# Patient Record
Sex: Female | Born: 1950 | Race: Black or African American | Hispanic: No | Smoking: Former smoker
Health system: Southern US, Community
[De-identification: ages and names within clinical notes are randomized; demographics above are authoritative.]

## PROBLEM LIST (undated history)

## (undated) DIAGNOSIS — N390 Urinary tract infection, site not specified: Secondary | ICD-10-CM

## (undated) DIAGNOSIS — E039 Hypothyroidism, unspecified: Secondary | ICD-10-CM

## (undated) DIAGNOSIS — K921 Melena: Secondary | ICD-10-CM

## (undated) DIAGNOSIS — N189 Chronic kidney disease, unspecified: Secondary | ICD-10-CM

## (undated) DIAGNOSIS — E119 Type 2 diabetes mellitus without complications: Secondary | ICD-10-CM

## (undated) DIAGNOSIS — I2510 Atherosclerotic heart disease of native coronary artery without angina pectoris: Secondary | ICD-10-CM

## (undated) DIAGNOSIS — I10 Essential (primary) hypertension: Secondary | ICD-10-CM

## (undated) DIAGNOSIS — E7800 Pure hypercholesterolemia, unspecified: Secondary | ICD-10-CM

## (undated) DIAGNOSIS — I6529 Occlusion and stenosis of unspecified carotid artery: Secondary | ICD-10-CM

## (undated) DIAGNOSIS — M1990 Unspecified osteoarthritis, unspecified site: Secondary | ICD-10-CM

## (undated) HISTORY — DX: Chronic kidney disease, unspecified: N18.9

## (undated) HISTORY — PX: AORTIC VALVE REPLACEMENT (AVR)/CORONARY ARTERY BYPASS GRAFTING (CABG): SHX5725

## (undated) HISTORY — DX: Hypothyroidism, unspecified: E03.9

## (undated) HISTORY — PX: ABDOMINAL HYSTERECTOMY: SHX81

## (undated) HISTORY — DX: Urinary tract infection, site not specified: N39.0

## (undated) HISTORY — DX: Occlusion and stenosis of unspecified carotid artery: I65.29

## (undated) HISTORY — DX: Melena: K92.1

## (undated) HISTORY — PX: COLONOSCOPY: SHX174

## (undated) HISTORY — DX: Pure hypercholesterolemia, unspecified: E78.00

## (undated) HISTORY — PX: EYE SURGERY: SHX253

## (undated) HISTORY — DX: Essential (primary) hypertension: I10

---

## 2010-01-12 DIAGNOSIS — I219 Acute myocardial infarction, unspecified: Secondary | ICD-10-CM

## 2010-01-12 HISTORY — DX: Acute myocardial infarction, unspecified: I21.9

## 2011-05-13 HISTORY — PX: CORONARY ARTERY BYPASS GRAFT: SHX141

## 2011-05-14 DIAGNOSIS — E039 Hypothyroidism, unspecified: Secondary | ICD-10-CM | POA: Insufficient documentation

## 2019-04-04 DIAGNOSIS — I2510 Atherosclerotic heart disease of native coronary artery without angina pectoris: Secondary | ICD-10-CM | POA: Insufficient documentation

## 2019-04-04 DIAGNOSIS — I10 Essential (primary) hypertension: Secondary | ICD-10-CM | POA: Insufficient documentation

## 2019-04-04 DIAGNOSIS — N183 Chronic kidney disease, stage 3 unspecified: Secondary | ICD-10-CM | POA: Insufficient documentation

## 2021-02-04 ENCOUNTER — Encounter: Payer: Self-pay | Admitting: Internal Medicine

## 2021-02-04 ENCOUNTER — Ambulatory Visit (INDEPENDENT_AMBULATORY_CARE_PROVIDER_SITE_OTHER): Payer: Medicare HMO | Admitting: Internal Medicine

## 2021-02-04 VITALS — BP 142/74 | HR 62 | Temp 98.2°F | Ht 60.63 in | Wt 166.9 lb

## 2021-02-04 DIAGNOSIS — N183 Chronic kidney disease, stage 3 unspecified: Secondary | ICD-10-CM | POA: Diagnosis not present

## 2021-02-04 DIAGNOSIS — E785 Hyperlipidemia, unspecified: Secondary | ICD-10-CM

## 2021-02-04 DIAGNOSIS — I10 Essential (primary) hypertension: Secondary | ICD-10-CM | POA: Diagnosis not present

## 2021-02-04 DIAGNOSIS — I2510 Atherosclerotic heart disease of native coronary artery without angina pectoris: Secondary | ICD-10-CM

## 2021-02-04 DIAGNOSIS — E039 Hypothyroidism, unspecified: Secondary | ICD-10-CM | POA: Diagnosis not present

## 2021-02-04 NOTE — Progress Notes (Signed)
New Patient Office Visit     This visit occurred during the SARS-CoV-2 public health emergency.  Safety protocols were in place, including screening questions prior to the visit, additional usage of staff PPE, and extensive cleaning of exam room while observing appropriate contact time as indicated for disinfecting solutions.    CC/Reason for Visit: Establish care, discuss chronic medical conditions Previous PCP: Lorelei Pont, MD Last Visit: Unknown  HPI: Tasha Garrett is a 71 y.o. female who is coming in today for the above mentioned reasons. Past Medical History is significant for: Hypertension, hyperlipidemia, stage III chronic kidney disease, hypothyroidism, coronary artery disease status post non-ST elevated MI in 2012.  I have no records available for review today.  She lives in [place].  She was a smoker but quit in 2012, she smoked for 20 years about half pack a day.  She drinks alcohol occasionally.  No known drug allergies, past surgical history significant for CABG x3 in 2012 and a hysterectomy.  Family history significant for mother who died at age 39 with ALS.  She is currently working at a school kitchen part-time, she has 3 children, lives with her daughter, has 10 grandchildren.  She is due for colonoscopy this year and is requesting a GI referral.  She had a mammogram in November.  She has not had the most recent COVID booster, is unclear about her pneumonia vaccination status but states she had her flu vaccine in October and has completed her shingles series.  She has no acute concerns or complaints today.   Past Medical/Surgical History: Past Medical History:  Diagnosis Date   Blood in stool    Chronic kidney disease    High cholesterol    Hypertension    Hypothyroid    Urinary tract infection     Past Surgical History:  Procedure Laterality Date   ABDOMINAL HYSTERECTOMY     AORTIC VALVE REPLACEMENT (AVR)/CORONARY ARTERY BYPASS GRAFTING (CABG)       Social History:  reports that she has quit smoking. Her smoking use included cigarettes. She has never used smokeless tobacco. She reports current alcohol use. She reports that she does not currently use drugs.  Allergies: No Known Allergies  Family History:  Family History  Problem Relation Age of Onset   ALS Mother      Current Outpatient Medications:    albuterol (VENTOLIN HFA) 108 (90 Base) MCG/ACT inhaler, Inhale 2 puffs into the lungs as needed., Disp: , Rfl:    amLODipine (NORVASC) 5 MG tablet, Take 1 tablet by mouth daily., Disp: , Rfl:    ASPIRIN 81 PO, Take 1 tablet by mouth daily., Disp: , Rfl:    atorvastatin (LIPITOR) 20 MG tablet, Take 20 mg by mouth daily., Disp: , Rfl:    Cod Liver Oil CAPS, Take by mouth., Disp: , Rfl:    FLUoxetine (PROZAC) 20 MG capsule, Take 20 mg by mouth daily., Disp: , Rfl:    hydrocortisone (ANUSOL-HC) 2.5 % rectal cream, SMARTSIG:Sparingly Topical 2-4 Times Daily PRN, Disp: , Rfl:    isosorbide mononitrate (IMDUR) 30 MG 24 hr tablet, Take 30 mg by mouth daily., Disp: , Rfl:    latanoprost (XALATAN) 0.005 % ophthalmic solution, 1 drop at bedtime., Disp: , Rfl:    levothyroxine (SYNTHROID) 137 MCG tablet, Take 137 mcg by mouth daily., Disp: , Rfl:    losartan (COZAAR) 100 MG tablet, Take 100 mg by mouth daily., Disp: , Rfl:    metoprolol tartrate (LOPRESSOR)  25 MG tablet, Take 25 mg by mouth 2 (two) times daily., Disp: , Rfl:    Multiple Vitamins-Minerals (MULTIVITAMIN ADULTS 50+ PO), Take 1 tablet by mouth daily., Disp: , Rfl:    terbinafine (LAMISIL) 250 MG tablet, Take 250 mg by mouth daily., Disp: , Rfl:    tiZANidine (ZANAFLEX) 4 MG tablet, Take 4 mg by mouth at bedtime., Disp: , Rfl:   Review of Systems:  Constitutional: Denies fever, chills, diaphoresis, appetite change and fatigue.  HEENT: Denies photophobia, eye pain, redness, hearing loss, ear pain, congestion, sore throat, rhinorrhea, sneezing, mouth sores, trouble swallowing,  neck pain, neck stiffness and tinnitus.   Respiratory: Denies SOB, DOE, cough, chest tightness,  and wheezing.   Cardiovascular: Denies chest pain, palpitations and leg swelling.  Gastrointestinal: Denies nausea, vomiting, abdominal pain, diarrhea, constipation, blood in stool and abdominal distention.  Genitourinary: Denies dysuria, urgency, frequency, hematuria, flank pain and difficulty urinating.  Endocrine: Denies: hot or cold intolerance, sweats, changes in hair or nails, polyuria, polydipsia. Musculoskeletal: Denies myalgias, back pain, joint swelling, arthralgias and gait problem.  Skin: Denies pallor, rash and wound.  Neurological: Denies dizziness, seizures, syncope, weakness, light-headedness, numbness and headaches.  Hematological: Denies adenopathy. Easy bruising, personal or family bleeding history  Psychiatric/Behavioral: Denies suicidal ideation, mood changes, confusion, nervousness, sleep disturbance and agitation    Physical Exam: Vitals:   02/04/21 1446  BP: (!) 142/74  Pulse: 62  Temp: 98.2 F (36.8 C)  TempSrc: Oral  SpO2: 96%  Weight: 166 lb 14.4 oz (75.7 kg)  Height: 5' 0.63" (1.54 m)   Body mass index is 31.92 kg/m.   Constitutional: NAD, calm, comfortable Eyes: PERRL, lids and conjunctivae normal ENMT: Mucous membranes are moist.  Respiratory: clear to auscultation bilaterally, no wheezing, no crackles. Normal respiratory effort. No accessory muscle use.  Cardiovascular: Regular rate and rhythm, no murmurs / rubs / gallops. No extremity edema.  Neurologic: Grossly intact and nonfocal Psychiatric: Normal judgment and insight. Alert and oriented x 3. Normal mood.    Impression and Plan:  Stage 3 chronic kidney disease, unspecified whether stage 3a or 3b CKD ([place]) -Check kidney function for baseline, she tells me she has a nephrologist in [place] as well.  Coronary arteriosclerosis -She has a cardiologist in [place].  Hyperlipidemia with target  LDL less than 70  - Plan: Lipid panel, Lipid panel  Essential hypertension  - Plan: CBC with Differential/Platelet, Comprehensive metabolic panel, Hemoglobin A1c, Hemoglobin A1c, Comprehensive metabolic panel, CBC with Differential/Platelet -Blood pressure is above goal today. -She will do ambulatory blood pressure monitoring and return in 3 months for follow-up.  Hypothyroidism, unspecified type  - Plan: TSH, TSH  Time spent: 46 minutes reviewing chart, interviewing and examining patient and formulating plan of care.    Patient Instructions  -Nice seeing you today!!  -Lab work today; will notify you once results are available.  -See you back in 6 months.    Lelon Frohlich, MD [facility]

## 2021-02-04 NOTE — Patient Instructions (Signed)
-  Nice seeing you today!!  -Lab work today; will notify you once results are available.  -See you back in 6 months! 

## 2021-02-05 LAB — CBC WITH DIFFERENTIAL/PLATELET
Basophils Absolute: 0.1 10*3/uL (ref 0.0–0.1)
Basophils Relative: 1.5 % (ref 0.0–3.0)
Eosinophils Absolute: 0.4 10*3/uL (ref 0.0–0.7)
Eosinophils Relative: 4.6 % (ref 0.0–5.0)
HCT: 39 % (ref 36.0–46.0)
Hemoglobin: 12 g/dL (ref 12.0–15.0)
Lymphocytes Relative: 25 % (ref 12.0–46.0)
Lymphs Abs: 2 10*3/uL (ref 0.7–4.0)
MCHC: 30.8 g/dL (ref 30.0–36.0)
MCV: 88.8 fl (ref 78.0–100.0)
Monocytes Absolute: 1 10*3/uL (ref 0.1–1.0)
Monocytes Relative: 12 % (ref 3.0–12.0)
Neutro Abs: 4.6 10*3/uL (ref 1.4–7.7)
Neutrophils Relative %: 56.9 % (ref 43.0–77.0)
Platelets: 306 10*3/uL (ref 150.0–400.0)
RBC: 4.4 Mil/uL (ref 3.87–5.11)
RDW: 13.4 % (ref 11.5–15.5)
WBC: 8.1 10*3/uL (ref 4.0–10.5)

## 2021-02-05 LAB — LIPID PANEL
Cholesterol: 157 mg/dL (ref 0–200)
HDL: 32.6 mg/dL — ABNORMAL LOW (ref >39.00)
NonHDL: 124.56
Total CHOL/HDL Ratio: 5
Triglycerides: 218 mg/dL — ABNORMAL HIGH (ref 0.0–149.0)
VLDL: 43.6 mg/dL — ABNORMAL HIGH (ref 0.0–40.0)

## 2021-02-05 LAB — COMPREHENSIVE METABOLIC PANEL WITH GFR
ALT: 19 U/L (ref 0–35)
AST: 20 U/L (ref 0–37)
Albumin: 4.3 g/dL (ref 3.5–5.2)
Alkaline Phosphatase: 112 U/L (ref 39–117)
BUN: 32 mg/dL — ABNORMAL HIGH (ref 6–23)
CO2: 28 meq/L (ref 19–32)
Calcium: 10.6 mg/dL — ABNORMAL HIGH (ref 8.4–10.5)
Chloride: 104 meq/L (ref 96–112)
Creatinine, Ser: 1.39 mg/dL — ABNORMAL HIGH (ref 0.40–1.20)
GFR: 38.38 mL/min — ABNORMAL LOW (ref >60.00)
Glucose, Bld: 117 mg/dL — ABNORMAL HIGH (ref 70–99)
Potassium: 4.7 meq/L (ref 3.5–5.1)
Sodium: 140 meq/L (ref 135–145)
Total Bilirubin: 0.5 mg/dL (ref 0.2–1.2)
Total Protein: 7.6 g/dL (ref 6.0–8.3)

## 2021-02-05 LAB — LDL CHOLESTEROL, DIRECT: Direct LDL: 100 mg/dL

## 2021-02-05 LAB — HEMOGLOBIN A1C: Hgb A1c MFr Bld: 6.4 % (ref 4.6–6.5)

## 2021-02-05 LAB — TSH: TSH: 1.62 u[IU]/mL (ref 0.35–5.50)

## 2021-02-06 ENCOUNTER — Encounter: Payer: Self-pay | Admitting: Internal Medicine

## 2021-02-06 ENCOUNTER — Other Ambulatory Visit: Payer: Self-pay | Admitting: Internal Medicine

## 2021-02-06 DIAGNOSIS — E785 Hyperlipidemia, unspecified: Secondary | ICD-10-CM

## 2021-02-06 DIAGNOSIS — R7302 Impaired glucose tolerance (oral): Secondary | ICD-10-CM | POA: Insufficient documentation

## 2021-02-06 MED ORDER — ATORVASTATIN CALCIUM 40 MG PO TABS: 40.0000 mg | ORAL_TABLET | Freq: Every day | ORAL | 1 refills | Status: DC

## 2021-02-10 ENCOUNTER — Telehealth: Payer: Self-pay | Admitting: Internal Medicine

## 2021-02-10 NOTE — Telephone Encounter (Signed)
Patient is returning a phone call back from Dr.Hernandez's CMA.  Patient could be contacted at [phone].  Please advise.

## 2021-02-10 NOTE — Telephone Encounter (Signed)
Called pt back unaware who left the message. Advise pt that it could have been for labs. Pt verbalized understanding.

## 2021-02-26 ENCOUNTER — Ambulatory Visit (INDEPENDENT_AMBULATORY_CARE_PROVIDER_SITE_OTHER): Payer: Medicare HMO

## 2021-02-26 ENCOUNTER — Ambulatory Visit (INDEPENDENT_AMBULATORY_CARE_PROVIDER_SITE_OTHER): Payer: Medicare HMO | Admitting: Internal Medicine

## 2021-02-26 ENCOUNTER — Other Ambulatory Visit: Payer: Self-pay

## 2021-02-26 VITALS — BP 150/75 | HR 72 | Temp 98.7°F | Resp 12 | Ht 60.06 in | Wt 166.9 lb

## 2021-02-26 DIAGNOSIS — K921 Melena: Secondary | ICD-10-CM | POA: Diagnosis not present

## 2021-02-26 DIAGNOSIS — R053 Chronic cough: Secondary | ICD-10-CM

## 2021-02-26 MED ORDER — HYDROCORTISONE (PERIANAL) 2.5 % EX CREA: 1.0000 "application " | TOPICAL_CREAM | Freq: Two times a day (BID) | CUTANEOUS | 0 refills | Status: DC

## 2021-02-26 NOTE — Patient Instructions (Addendum)
-  Nice seeing you today!!  -No need for lab work today.  -Anusol twice a day to anal area. Will get you in to see GI.  -CXR and CT chest for lung cancer screening have been ordered.

## 2021-02-26 NOTE — Progress Notes (Signed)
Established Patient Office Visit     This visit occurred during the SARS-CoV-2 public health emergency.  Safety protocols were in place, including screening questions prior to the visit, additional usage of staff PPE, and extensive cleaning of exam room while observing appropriate contact time as indicated for disinfecting solutions.    CC/Reason for Visit: Blood in stool and chronic cough  HPI: Tasha Garrett is a 71 y.o. female who is coming in today for the above mentioned reasons. Past Medical History is significant for: Hypertension, hyperlipidemia, impaired glucose tolerance, stage III chronic kidney disease, coronary artery disease and hypothyroidism.  She has been having bright red blood in her stool now for a few months.  It is small amounts that she notices mostly when she uses toilet paper.  Her anal area is very pruritic.  She has a history of hemorrhoids.  She has also been having a chronic cough that sounds like has been present for years.  She tells me she first remembered having it around 2012 when she had her cardiac event.  This cough is daily, it is dry, she is not on an ACE inhibitor.  She was a heavy smoker of about a pack to a pack and a half a day until her cardiac event in 2012.   Past Medical/Surgical History: Past Medical History:  Diagnosis Date   Blood in stool    Chronic kidney disease    High cholesterol    Hypertension    Hypothyroid    Urinary tract infection     Past Surgical History:  Procedure Laterality Date   ABDOMINAL HYSTERECTOMY     AORTIC VALVE REPLACEMENT (AVR)/CORONARY ARTERY BYPASS GRAFTING (CABG)      Social History:  reports that she has quit smoking. Her smoking use included cigarettes. She has never used smokeless tobacco. She reports current alcohol use. She reports that she does not currently use drugs.  Allergies: No Known Allergies  Family History:  Family History  Problem Relation Age of Onset   ALS Mother       Current Outpatient Medications:    albuterol (VENTOLIN HFA) 108 (90 Base) MCG/ACT inhaler, Inhale 2 puffs into the lungs as needed., Disp: , Rfl:    amLODipine (NORVASC) 5 MG tablet, Take 1 tablet by mouth daily., Disp: , Rfl:    ASPIRIN 81 PO, Take 1 tablet by mouth daily., Disp: , Rfl:    atorvastatin (LIPITOR) 40 MG tablet, Take 1 tablet (40 mg total) by mouth daily., Disp: 90 tablet, Rfl: 1   Cod Liver Oil CAPS, Take by mouth., Disp: , Rfl:    FLUoxetine (PROZAC) 20 MG capsule, Take 20 mg by mouth daily., Disp: , Rfl:    hydrocortisone (ANUSOL-HC) 2.5 % rectal cream, Place 1 application rectally 2 (two) times daily., Disp: 30 g, Rfl: 0   isosorbide mononitrate (IMDUR) 30 MG 24 hr tablet, Take 30 mg by mouth daily., Disp: , Rfl:    latanoprost (XALATAN) 0.005 % ophthalmic solution, 1 drop at bedtime., Disp: , Rfl:    levothyroxine (SYNTHROID) 137 MCG tablet, Take 137 mcg by mouth daily., Disp: , Rfl:    losartan (COZAAR) 100 MG tablet, Take 100 mg by mouth daily., Disp: , Rfl:    metoprolol tartrate (LOPRESSOR) 25 MG tablet, Take 25 mg by mouth 2 (two) times daily., Disp: , Rfl:    Multiple Vitamins-Minerals (MULTIVITAMIN ADULTS 50+ PO), Take 1 tablet by mouth daily., Disp: , Rfl:    terbinafine (LAMISIL)  250 MG tablet, Take 250 mg by mouth daily., Disp: , Rfl:   Review of Systems:  Constitutional: Denies fever, chills, diaphoresis, appetite change and fatigue.  HEENT: Denies photophobia, eye pain, redness, hearing loss, ear pain, congestion, sore throat, rhinorrhea, sneezing, mouth sores, trouble swallowing, neck pain, neck stiffness and tinnitus.   Respiratory: Denies SOB, DOE, chest tightness,  and wheezing.   Cardiovascular: Denies chest pain, palpitations and leg swelling.  Gastrointestinal: Denies nausea, vomiting, abdominal pain, diarrhea, constipation and abdominal distention.  Genitourinary: Denies dysuria, urgency, frequency, hematuria, flank pain and difficulty urinating.   Endocrine: Denies: hot or cold intolerance, sweats, changes in hair or nails, polyuria, polydipsia. Musculoskeletal: Denies myalgias, back pain, joint swelling, arthralgias and gait problem.  Skin: Denies pallor, rash and wound.  Neurological: Denies dizziness, seizures, syncope, weakness, light-headedness, numbness and headaches.  Hematological: Denies adenopathy. Easy bruising, personal or family bleeding history  Psychiatric/Behavioral: Denies suicidal ideation, mood changes, confusion, nervousness, sleep disturbance and agitation    Physical Exam: Vitals:   02/26/21 1355  BP: (!) 150/75  Pulse: 72  Resp: 12  Temp: 98.7 F (37.1 C)  Weight: 166 lb 14.4 oz (75.7 kg)  Height: 5' 0.06" (1.526 m)    Body mass index is 32.53 kg/m.   Constitutional: NAD, calm, comfortable Eyes: PERRL, lids and conjunctivae normal, wears corrective lenses ENMT: Mucous membranes are moist.  Respiratory: clear to auscultation bilaterally, no wheezing, no crackles. Normal respiratory effort. No accessory muscle use.  Cardiovascular: Regular rate and rhythm, no murmurs / rubs / gallops. No extremity edema.  Neurologic: Grossly intact and nonfocal Psychiatric: Normal judgment and insight. Alert and oriented x 3. Normal mood.    Impression and Plan:  Hematochezia  - Plan: hydrocortisone (ANUSOL-HC) 2.5 % rectal cream, Ambulatory referral to Gastroenterology -Suspect related to her history of hemorrhoids, possibly an anal fissure.  She will use hydrocortisone cream, she tells me she is due for screening colonoscopy this year anyways so we will refer to GI for evaluation.  Chronic cough  - Plan: DG Chest 2 View, Ambulatory Referral Lung Cancer Screening Home Pulmonary  Time spent: 31 minutes reviewing chart, interviewing and examining patient and formulating plan of care.   Patient Instructions  -Nice seeing you today!!  -No need for labwork today.  -Anusol twice a day to anal area. Will  get you in to see GI.  -CXR and CT chest for lung cancer screening have been ordered.    Lelon Frohlich, MD [facility]

## 2021-02-26 NOTE — Addendum Note (Signed)
Addended by: Henderson Cloud on: 02/26/2021 02:31 PM   Modules accepted: Orders

## 2021-02-27 NOTE — Progress Notes (Signed)
CXR is normal. Await CT chest. If that is normal, can consider empirically treating for GERD to see if that would improve her chronic cough.

## 2021-03-11 ENCOUNTER — Other Ambulatory Visit: Payer: Self-pay | Admitting: *Deleted

## 2021-03-11 DIAGNOSIS — E785 Hyperlipidemia, unspecified: Secondary | ICD-10-CM

## 2021-03-11 MED ORDER — ATORVASTATIN CALCIUM 40 MG PO TABS: 40.0000 mg | ORAL_TABLET | Freq: Every day | ORAL | 1 refills | Status: DC

## 2021-04-03 ENCOUNTER — Other Ambulatory Visit: Payer: Self-pay | Admitting: Internal Medicine

## 2021-04-03 DIAGNOSIS — I10 Essential (primary) hypertension: Secondary | ICD-10-CM

## 2021-04-17 ENCOUNTER — Other Ambulatory Visit: Payer: Self-pay | Admitting: *Deleted

## 2021-04-17 DIAGNOSIS — Z87891 Personal history of nicotine dependence: Secondary | ICD-10-CM

## 2021-04-17 DIAGNOSIS — Z122 Encounter for screening for malignant neoplasm of respiratory organs: Secondary | ICD-10-CM

## 2021-05-08 ENCOUNTER — Encounter: Payer: Self-pay | Admitting: Internal Medicine

## 2021-05-08 ENCOUNTER — Ambulatory Visit (INDEPENDENT_AMBULATORY_CARE_PROVIDER_SITE_OTHER): Payer: Medicare HMO | Admitting: Internal Medicine

## 2021-05-08 VITALS — BP 130/70 | HR 64 | Temp 97.7°F | Wt 164.9 lb

## 2021-05-08 DIAGNOSIS — I10 Essential (primary) hypertension: Secondary | ICD-10-CM

## 2021-05-08 DIAGNOSIS — Z13820 Encounter for screening for osteoporosis: Secondary | ICD-10-CM

## 2021-05-08 DIAGNOSIS — R7302 Impaired glucose tolerance (oral): Secondary | ICD-10-CM

## 2021-05-08 DIAGNOSIS — E1159 Type 2 diabetes mellitus with other circulatory complications: Secondary | ICD-10-CM

## 2021-05-08 DIAGNOSIS — E119 Type 2 diabetes mellitus without complications: Secondary | ICD-10-CM | POA: Insufficient documentation

## 2021-05-08 DIAGNOSIS — E785 Hyperlipidemia, unspecified: Secondary | ICD-10-CM

## 2021-05-08 DIAGNOSIS — Z1211 Encounter for screening for malignant neoplasm of colon: Secondary | ICD-10-CM

## 2021-05-08 DIAGNOSIS — N183 Chronic kidney disease, stage 3 unspecified: Secondary | ICD-10-CM

## 2021-05-08 LAB — POCT GLYCOSYLATED HEMOGLOBIN (HGB A1C): Hemoglobin A1C: 6.6 % — AB (ref 4.0–5.6)

## 2021-05-08 NOTE — Progress Notes (Signed)
? ? ? ?Established Patient Office Visit ? ? ? ? ?This visit occurred during the SARS-CoV-2 public health emergency.  Safety protocols were in place, including screening questions prior to the visit, additional usage of staff PPE, and extensive cleaning of exam room while observing appropriate contact time as indicated for disinfecting solutions.  ? ? ?CC/Reason for Visit: 21-month follow-up chronic medical conditions ? ?HPI: Tasha Garrett is a 71 y.o. female who is coming in today for the above mentioned reasons. Past Medical History is significant for: Hypertension, hyperlipidemia, stage III chronic kidney disease, hypothyroidism, coronary artery disease status post non-ST elevated MI in 2012.  She feels well today and has no acute concerns.  She was noted to have an elevated blood pressure at her last in office visit.  She was asked to do ambulatory monitoring.  She does not bring in her log today but tells me that it averaged 1 [id number] systolic over [id number] diastolic.  She feels well and has no acute concerns.  She has a chronic cough, chest x-ray in office was negative.  Because of her smoking history she was referred for a low-dose CT scan for lung cancer screening that is scheduled for May 19.  She was diagnosed as a prediabetic with an A1c of 6.4 in January.  She is here to follow-up on this as well. ? ? ?Past Medical/Surgical History: ?Past Medical History:  ?Diagnosis Date  ? Blood in stool   ? Chronic kidney disease   ? High cholesterol   ? Hypertension   ? Hypothyroid   ? Urinary tract infection   ? ? ?Past Surgical History:  ?Procedure Laterality Date  ? ABDOMINAL HYSTERECTOMY    ? AORTIC VALVE REPLACEMENT (AVR)/CORONARY ARTERY BYPASS GRAFTING (CABG)    ? ? ?Social History: ? reports that she has quit smoking. Her smoking use included cigarettes. She has never used smokeless tobacco. She reports current alcohol use. She reports that she does not currently use drugs. ? ?Allergies: ?No Known Allergies ? ?Family  History:  ?Family History  ?Problem Relation Age of Onset  ? ALS Mother   ? ? ? ?Current Outpatient Medications:  ?  albuterol (VENTOLIN HFA) 108 (90 Base) MCG/ACT inhaler, Inhale 2 puffs into the lungs as needed., Disp: , Rfl:  ?  amLODipine (NORVASC) 5 MG tablet, TAKE 1 TABLET BY MOUTH EVERY DAY, Disp: 90 tablet, Rfl: 1 ?  ASPIRIN 81 PO, Take 1 tablet by mouth daily., Disp: , Rfl:  ?  atorvastatin (LIPITOR) 40 MG tablet, Take 1 tablet (40 mg total) by mouth daily., Disp: 90 tablet, Rfl: 1 ?  Cod Liver Oil CAPS, Take by mouth., Disp: , Rfl:  ?  FLUoxetine (PROZAC) 20 MG capsule, Take 20 mg by mouth daily., Disp: , Rfl:  ?  hydrocortisone (ANUSOL-HC) 2.5 % rectal cream, Place 1 application rectally 2 (two) times daily., Disp: 30 g, Rfl: 0 ?  isosorbide mononitrate (IMDUR) 30 MG 24 hr tablet, Take 30 mg by mouth daily., Disp: , Rfl:  ?  latanoprost (XALATAN) 0.005 % ophthalmic solution, 1 drop at bedtime., Disp: , Rfl:  ?  levothyroxine (SYNTHROID) 137 MCG tablet, Take 137 mcg by mouth daily., Disp: , Rfl:  ?  losartan (COZAAR) 100 MG tablet, Take 100 mg by mouth daily., Disp: , Rfl:  ?  metoprolol tartrate (LOPRESSOR) 25 MG tablet, Take 25 mg by mouth 2 (two) times daily., Disp: , Rfl:  ?  Multiple Vitamins-Minerals (MULTIVITAMIN ADULTS 50+ PO), Take 1 tablet  by mouth daily., Disp: , Rfl:  ?  terbinafine (LAMISIL) 250 MG tablet, Take 250 mg by mouth daily., Disp: , Rfl:  ? ?Review of Systems:  ?Constitutional: Denies fever, chills, diaphoresis, appetite change and fatigue.  ?HEENT: Denies photophobia, eye pain, redness, hearing loss, ear pain, congestion, sore throat, rhinorrhea, sneezing, mouth sores, trouble swallowing, neck pain, neck stiffness and tinnitus.   ?Respiratory: Denies SOB, DOE, cough, chest tightness,  and wheezing.   ?Cardiovascular: Denies chest pain, palpitations and leg swelling.  ?Gastrointestinal: Denies nausea, vomiting, abdominal pain, diarrhea, constipation, blood in stool and abdominal  distention.  ?Genitourinary: Denies dysuria, urgency, frequency, hematuria, flank pain and difficulty urinating.  ?Endocrine: Denies: hot or cold intolerance, sweats, changes in hair or nails, polyuria, polydipsia. ?Musculoskeletal: Denies myalgias, back pain, joint swelling, arthralgias and gait problem.  ?Skin: Denies pallor, rash and wound.  ?Neurological: Denies dizziness, seizures, syncope, weakness, light-headedness, numbness and headaches.  ?Hematological: Denies adenopathy. Easy bruising, personal or family bleeding history  ?Psychiatric/Behavioral: Denies suicidal ideation, mood changes, confusion, nervousness, sleep disturbance and agitation ? ? ? ?Physical Exam: ?Vitals:  ? 05/08/21 1530 05/08/21 1600 05/08/21 1631  ?BP: (!) 150/80 (!) 150/80 130/70  ?Pulse: 64    ?Temp: 97.7 ?F (36.5 ?C)    ?TempSrc: Oral    ?SpO2: 98%    ?Weight: 164 lb 14.4 oz (74.8 kg)    ? ? ?Body mass index is 32.14 kg/m?. ? ? ?Constitutional: NAD, calm, comfortable ?Eyes: PERRL, lids and conjunctivae normal ?ENMT: Mucous membranes are moist. or bulging. ?Neck: normal, supple, no masses, no thyromegaly ?Respiratory: clear to auscultation bilaterally, no wheezing, no crackles. Normal respiratory effort. No accessory muscle use.  ?Cardiovascular: Regular rate and rhythm, no murmurs / rubs / gallops. No extremity edema.  ?Neurologic: Mostly intact and nonfocal ?Psychiatric: Normal judgment and insight. Alert and oriented x 3. Normal mood.  ? ? ?Impression and Plan: ? ?Type 2 diabetes mellitus with other circulatory complication, without long-term current use of insulin ([place])  ?- Plan: CBC with Differential/Platelet, Comprehensive metabolic panel, Microalbumin / creatinine urine ratio, Ambulatory referral to diabetic education, Ambulatory referral to Ophthalmology, Microalbumin / creatinine urine ratio, Comprehensive metabolic panel, CBC with Differential/Platelet ?-New diabetic based on A1c of 6.6 in office today. ?-She will be  referred to diabetic education, have given her information on nutrition, she will continue to work on lifestyle changes and return in 3 months for follow-up.  If A1c continues to increase may need to consider medication. ?-Microalbumin and diabetic eye exam requested today. ? ?Stage 3 chronic kidney disease, unspecified whether stage 3a or 3b CKD ([place]) ?-Last creatinine was 1.390 in January. ? ?Hyperlipidemia with target LDL less than 70  ?- Plan: Lipid panel, Lipid panel ?-On atorvastatin 40 mg daily. ? ?Essential hypertension ?-I wonder if she has an element of whitecoat syndrome as her home measurements are within range but her in office measurements are high. ?-She will return in 3 months for follow-up with her ambulatory log.  If measurements continue to be high may need to consider augmenting antihypertensive therapy. ? ?Colon cancer screening ? - Plan: Ambulatory referral to Gastroenterology ? ?Osteoporosis screening  ?- Plan: DG Bone Density ? ? ? ?Time spent:32 minutes reviewing chart, interviewing and examining patient and formulating plan of care. ? ? ?Patient Instructions  ?-Nice seeing you today!! ? ?-Lab work today; will notify you once results are available. ? ?-you are now a diabetic. Work on Lifestyle modifications: healthy eating, weight loss, increased physical activity.  ? ?  Remember to check your BP at home and bring numbers next time you come. ? ?-Schedule follow up in 3 months. ? ? ? ? ? ? ?Lelon Frohlich, MD ?[facility] ? ? ?

## 2021-05-08 NOTE — Patient Instructions (Signed)
-  Nice seeing you today!! ? ?-Lab work today; will notify you once results are available. ? ?-you are now a diabetic. Work on Lifestyle modifications: healthy eating, weight loss, increased physical activity.  ? ?Remember to check your BP at home and bring numbers next time you come. ? ?-Schedule follow up in 3 months. ? ? ? ?

## 2021-05-09 LAB — LIPID PANEL
Cholesterol: 125 mg/dL (ref 0–200)
HDL: 28 mg/dL — ABNORMAL LOW (ref >39.00)
LDL Cholesterol: 70 mg/dL (ref 0–99)
NonHDL: 97.04
Total CHOL/HDL Ratio: 4
Triglycerides: 135 mg/dL (ref 0.0–149.0)
VLDL: 27 mg/dL (ref 0.0–40.0)

## 2021-05-09 LAB — COMPREHENSIVE METABOLIC PANEL WITH GFR
ALT: 24 U/L (ref 0–35)
AST: 24 U/L (ref 0–37)
Albumin: 4.2 g/dL (ref 3.5–5.2)
Alkaline Phosphatase: 121 U/L — ABNORMAL HIGH (ref 39–117)
BUN: 23 mg/dL (ref 6–23)
CO2: 25 meq/L (ref 19–32)
Calcium: 9.7 mg/dL (ref 8.4–10.5)
Chloride: 106 meq/L (ref 96–112)
Creatinine, Ser: 1.13 mg/dL (ref 0.40–1.20)
GFR: 49.11 mL/min — ABNORMAL LOW (ref >60.00)
Glucose, Bld: 90 mg/dL (ref 70–99)
Potassium: 4.7 meq/L (ref 3.5–5.1)
Sodium: 140 meq/L (ref 135–145)
Total Bilirubin: 0.6 mg/dL (ref 0.2–1.2)
Total Protein: 7.2 g/dL (ref 6.0–8.3)

## 2021-05-09 LAB — CBC WITH DIFFERENTIAL/PLATELET
Basophils Absolute: 0.1 10*3/uL (ref 0.0–0.1)
Basophils Relative: 1.1 % (ref 0.0–3.0)
Eosinophils Absolute: 0.3 10*3/uL (ref 0.0–0.7)
Eosinophils Relative: 4.7 % (ref 0.0–5.0)
HCT: 38.2 % (ref 36.0–46.0)
Hemoglobin: 12.1 g/dL (ref 12.0–15.0)
Lymphocytes Relative: 25.1 % (ref 12.0–46.0)
Lymphs Abs: 1.6 10*3/uL (ref 0.7–4.0)
MCHC: 31.7 g/dL (ref 30.0–36.0)
MCV: 90.8 fl (ref 78.0–100.0)
Monocytes Absolute: 0.8 10*3/uL (ref 0.1–1.0)
Monocytes Relative: 12.8 % — ABNORMAL HIGH (ref 3.0–12.0)
Neutro Abs: 3.5 10*3/uL (ref 1.4–7.7)
Neutrophils Relative %: 56.3 % (ref 43.0–77.0)
Platelets: 287 10*3/uL (ref 150.0–400.0)
RBC: 4.2 Mil/uL (ref 3.87–5.11)
RDW: 13.4 % (ref 11.5–15.5)
WBC: 6.3 10*3/uL (ref 4.0–10.5)

## 2021-05-09 LAB — MICROALBUMIN / CREATININE URINE RATIO
Creatinine,U: 130.2 mg/dL
Microalb Creat Ratio: 34.4 mg/g — ABNORMAL HIGH (ref 0.0–30.0)
Microalb, Ur: 44.8 mg/dL — ABNORMAL HIGH (ref 0.0–1.9)

## 2021-05-12 ENCOUNTER — Encounter: Payer: Self-pay | Admitting: Internal Medicine

## 2021-05-12 DIAGNOSIS — R809 Proteinuria, unspecified: Secondary | ICD-10-CM | POA: Insufficient documentation

## 2021-05-27 ENCOUNTER — Encounter: Payer: Self-pay | Admitting: Acute Care

## 2021-05-27 ENCOUNTER — Ambulatory Visit (INDEPENDENT_AMBULATORY_CARE_PROVIDER_SITE_OTHER): Payer: Medicare HMO | Admitting: Acute Care

## 2021-05-27 DIAGNOSIS — Z87891 Personal history of nicotine dependence: Secondary | ICD-10-CM

## 2021-05-27 LAB — BASIC METABOLIC PANEL
Chloride: 107 (ref 99–108)
Creatinine: 1.3 — AB (ref 0.5–1.1)
Glucose: 118
Potassium: 5.2 mEq/L — AB (ref 3.5–5.1)
Sodium: 143 (ref 137–147)

## 2021-05-27 LAB — COMPREHENSIVE METABOLIC PANEL
Albumin: 4.3 (ref 3.5–5.0)
Calcium: 9.6 (ref 8.7–10.7)
eGFR: 49.61

## 2021-05-27 LAB — VITAMIN D 25 HYDROXY (VIT D DEFICIENCY, FRACTURES): Vit D, 25-Hydroxy: 33

## 2021-05-27 LAB — CBC AND DIFFERENTIAL: Hemoglobin: 11.7 — AB (ref 12.0–16.0)

## 2021-05-27 NOTE — Progress Notes (Deleted)
Virtual Visit via Telephone Note ? ?I connected with Tasha Garrett on 05/27/21 at  2:30 PM EDT by telephone and verified that I am speaking with the correct person using two identifiers. ? ?Location: ?Patient:  At home ?Provider:  [address], [place], [place], [address]  ?  ?I discussed the limitations, risks, security and privacy concerns of performing an evaluation and management service by telephone and the availability of in person appointments. I also discussed with the patient that there may be a patient responsible charge related to this service. The patient expressed understanding and agreed to proceed. ? ? ?Shared Decision Making Visit Lung Cancer Screening Program ?([phone]) ? ? ?Eligibility: ?Age 71 y.o. ?Pack Years Smoking History Calculation 20 pack year smoking history ?(# packs/per year x # years smoked) ?Recent History of coughing up blood  no ?Unexplained weight loss? no ?( >Than 15 pounds within the last 6 months ) ?Prior History Lung / other cancer no ?(Diagnosis within the last 5 years already requiring surveillance chest CT Scans). ?Smoking Status Former Smoker ?Former Smokers: Years since quit: 11 years ? Quit Date: 2012 ? ?Visit Components: ?Discussion included one or more decision making aids. yes ?Discussion included risk/benefits of screening. yes ?Discussion included potential follow up diagnostic testing for abnormal scans. yes ?Discussion included meaning and risk of over diagnosis. yes ?Discussion included meaning and risk of False Positives. yes ?Discussion included meaning of total radiation exposure. yes ? ?Counseling Included: ?Importance of adherence to annual lung cancer LDCT screening. yes ?Impact of comorbidities on ability to participate in the program. yes ?Ability and willingness to under diagnostic treatment. yes ? ?Smoking Cessation Counseling: ?Current Smokers:  ?Discussed importance of smoking cessation. yes ?Information about tobacco cessation classes and  interventions provided to patient. yes ?Patient provided with "ticket" for LDCT Scan. yes ?Symptomatic Patient. no ? Counseling NA ?Diagnosis Code: Tobacco Use Z72.0 ?Asymptomatic Patient yes ? Counseling (Intermediate counseling: > three minutes counseling) S0109 ?Former Smokers:  ?Discussed the importance of maintaining cigarette abstinence. yes ?Diagnosis Code: Personal History of Nicotine Dependence. N23.557 ?Information about tobacco cessation classes and interventions provided to patient. Yes ?Patient provided with "ticket" for LDCT Scan. yes ?Written Order for Lung Cancer Screening with LDCT placed in Epic. Yes ?(CT Chest Lung Cancer Screening Low Dose W/O CM) [id number] ?Z12.2-Screening of respiratory organs ?Z87.891-Personal history of nicotine dependence ? ?I spent 25 minutes of face to face time/virtual visit time  with  Tasha Garrett discussing the risks and benefits of lung cancer screening. We took the time to pause the power point at intervals to allow for questions to be asked and answered to ensure understanding. We discussed that she had taken the single most powerful action possible to decrease her risk of developing lung cancer when she quit smoking. I counseled her to remain smoke free, and to contact me if she ever had the desire to smoke again so that I can provide resources and tools to help support the effort to remain smoke free. We discussed the time and location of the scan, and that either  Abigail Miyamoto RN, Karlton Lemon, RN or I  or I will call / send a letter with the results within  24-72 hours of receiving them. She has the office contact information in the event she needs to speak with me,  she verbalized understanding of all of the above and had no further questions upon leaving the office.  ? ? ? ?I explained to the patient that there has  been a high incidence of coronary artery disease noted on these exams. I explained that this is a non-gated exam therefore degree or severity cannot  be determined. This patient is on statin therapy. I have asked the patient to follow-up with their PCP regarding any incidental finding of coronary artery disease and management with diet or medication as they feel is clinically indicated. The patient verbalized understanding of the above and had no further questions. ?  ? ? ?Magdalen Spatz, NP ?05/27/2021 ? ? ? ? ? ? ?

## 2021-05-27 NOTE — Patient Instructions (Signed)
Thank you for participating in the [facility] Screening Program. It was our pleasure to meet you today. We will call you with the results of your scan within the next few days. Your scan will be assigned a Lung RADS category score by the physicians reading the scans.  This Lung RADS score determines follow up scanning.  See below for description of categories, and follow up screening recommendations. We will be in touch to schedule your follow up screening annually or based on recommendations of our providers. We will fax a copy of your scan results to your Primary Care Physician, or the physician who referred you to the program, to ensure they have the results. Please call the office if you have any questions or concerns regarding your scanning experience or results.  Our office number is [phone]. Please speak with Denise Phelps, RN. , or  Denise Buckner RN, They are  our Lung Cancer Screening RN.'s If They are unavailable when you call, Please leave a message on the voice mail. We will return your call at our earliest convenience.This voice mail is monitored several times a day.  Remember, if your scan is normal, we will scan you annually as long as you continue to meet the criteria for the program. (Age 55-77, Current smoker or smoker who has quit within the last 15 years). If you are a smoker, remember, quitting is the single most powerful action that you can take to decrease your risk of lung cancer and other pulmonary, breathing related problems. We know quitting is hard, and we are here to help.  Please let us know if there is anything we can do to help you meet your goal of quitting. If you are a former smoker, congratulations. We are proud of you! Remain smoke free! Remember you can refer friends or family members through the number above.  We will screen them to make sure they meet criteria for the program. Thank you for helping us take better care of you by  participating in Lung Screening.  You can receive free nicotine replacement therapy ( patches, gum or mints) by calling 1-800-QUIT NOW. Please call so we can get you on the path to becoming  a non-smoker. I know it is hard, but you can do this!  Lung RADS Categories:  Lung RADS 1: no nodules or definitely non-concerning nodules.  Recommendation is for a repeat annual scan in 12 months.  Lung RADS 2:  nodules that are non-concerning in appearance and behavior with a very low likelihood of becoming an active cancer. Recommendation is for a repeat annual scan in 12 months.  Lung RADS 3: nodules that are probably non-concerning , includes nodules with a low likelihood of becoming an active cancer.  Recommendation is for a 6-month repeat screening scan. Often noted after an upper respiratory illness. We will be in touch to make sure you have no questions, and to schedule your 6-month scan.  Lung RADS 4 A: nodules with concerning findings, recommendation is most often for a follow up scan in 3 months or additional testing based on our provider's assessment of the scan. We will be in touch to make sure you have no questions and to schedule the recommended 3 month follow up scan.  Lung RADS 4 B:  indicates findings that are concerning. We will be in touch with you to schedule additional diagnostic testing based on our provider's  assessment of the scan.  Other options for assistance in smoking cessation (   As covered by your insurance benefits)  Hypnosis for smoking cessation  [employer] [phone]  Acupuncture for smoking cessation  [facility] [phone]   

## 2021-05-28 ENCOUNTER — Encounter: Payer: Medicare HMO | Admitting: Acute Care

## 2021-05-30 ENCOUNTER — Ambulatory Visit (HOSPITAL_COMMUNITY): Payer: Medicare HMO

## 2021-06-04 ENCOUNTER — Encounter: Payer: Self-pay | Admitting: Internal Medicine

## 2021-06-30 ENCOUNTER — Ambulatory Visit (INDEPENDENT_AMBULATORY_CARE_PROVIDER_SITE_OTHER): Payer: Medicare HMO | Admitting: Acute Care

## 2021-06-30 ENCOUNTER — Encounter: Payer: Self-pay | Admitting: Acute Care

## 2021-06-30 DIAGNOSIS — Z87891 Personal history of nicotine dependence: Secondary | ICD-10-CM | POA: Diagnosis not present

## 2021-06-30 NOTE — Progress Notes (Addendum)
Virtual Visit via Telephone Note  I connected with Tasha Garrett on 11/26/20 at  2:00 PM EST by telephone and verified that I am speaking with the correct person using two identifiers.  Location: Patient: Home Provider: Working from home   I discussed the limitations, risks, security and privacy concerns of performing an evaluation and management service by telephone and the availability of in person appointments. I also discussed with the patient that there may be a patient responsible charge related to this service. The patient expressed understanding and agreed to proceed.  Shared Decision Making Visit Lung Cancer Screening Program [phone])   Eligibility: Age 71 y.o. Pack Years Smoking History Calculation 34 (# packs/per year x # years smoked) Recent History of coughing up blood  no Unexplained weight loss? no ( >Than 15 pounds within the last 6 months ) Prior History Lung / other cancer no (Diagnosis within the last 5 years already requiring surveillance chest CT Scans). Smoking Status Former Smoker Former Smokers: Years since quit: 11 years  Quit Date: 04/2010  Visit Components: Discussion included one or more decision making aids. yes Discussion included risk/benefits of screening. yes Discussion included potential follow up diagnostic testing for abnormal scans. yes Discussion included meaning and risk of over diagnosis. yes Discussion included meaning and risk of False Positives. yes Discussion included meaning of total radiation exposure. yes  Counseling Included: Importance of adherence to annual lung cancer LDCT screening. yes Impact of comorbidities on ability to participate in the program. yes Ability and willingness to under diagnostic treatment. yes  Smoking Cessation Counseling: Current Smokers:  Discussed importance of smoking cessation. yes Information about tobacco cessation classes and interventions provided to patient. yes Patient provided with "ticket"  for LDCT Scan. yes Symptomatic Patient. no  Counseling NA Diagnosis Code: Tobacco Use Z72.0 Asymptomatic Patient yes  Counseling NA Former Smokers:  Discussed the importance of maintaining cigarette abstinence. yes Diagnosis Code: Personal History of Nicotine Dependence. Q94.765 Information about tobacco cessation classes and interventions provided to patient. Yes Patient provided with "ticket" for LDCT Scan. yes Written Order for Lung Cancer Screening with LDCT placed in Epic. Yes (CT Chest Lung Cancer Screening Low Dose W/O CM) [id number] Z12.2-Screening of respiratory organs Z87.891-Personal history of nicotine dependence   I spent 25 minutes of face to face time with she discussing the risks and benefits of lung cancer screening. We viewed a power point together that explained in detail the above noted topics. We took the time to pause the power point at intervals to allow for questions to be asked and answered to ensure understanding. We discussed that she had taken the single most powerful action possible to decrease her risk of developing lung cancer when she quit smoking. I counseled her to remain smoke free, and to contact me if she ever had the desire to smoke again so that I can provide resources and tools to help support the effort to remain smoke free. We discussed the time and location of the scan, and that either  Tasha Miyamoto RN or I will call with the results within  24-48 hours of receiving them. She has my card and contact information in the event she needs to speak with me, in addition to a copy of the power point we reviewed as a resource. She verbalized understanding of all of the above and had no further questions upon leaving the office.     I explained to the patient that there has been a high incidence of coronary  artery disease noted on these exams. I explained that this is a non-gated exam therefore degree or severity cannot be determined. This patient is on statin  therapy. I have asked the patient to follow-up with their PCP regarding any incidental finding of coronary artery disease and management with diet or medication as they feel is clinically indicated. The patient verbalized understanding of the above and had no further questions.   Tasha Garrett D. Tiburcio Pea, NP-C [facility] Personal contact information can be found on Amion  06/30/2021, 8:12 AM

## 2021-06-30 NOTE — Patient Instructions (Signed)
Thank you for participating in the [facility] Lung Cancer Screening Program. It was our pleasure to meet you today. We will call you with the results of your scan within the next few days. Your scan will be assigned a Lung RADS category score by the physicians reading the scans.  This Lung RADS score determines follow up scanning.  See below for description of categories, and follow up screening recommendations. We will be in touch to schedule your follow up screening annually or based on recommendations of our providers. We will fax a copy of your scan results to your Primary Care Physician, or the physician who referred you to the program, to ensure they have the results. Please call the office if you have any questions or concerns regarding your scanning experience or results.  Our office number is [phone]. Please speak with Denise Phelps, RN. , or  Denise Buckner RN, They are  our Lung Cancer Screening RN.'s If They are unavailable when you call, Please leave a message on the voice mail. We will return your call at our earliest convenience.This voice mail is monitored several times a day.  Remember, if your scan is normal, we will scan you annually as long as you continue to meet the criteria for the program. (Age 55-77, Current smoker or smoker who has quit within the last 15 years). If you are a smoker, remember, quitting is the single most powerful action that you can take to decrease your risk of lung cancer and other pulmonary, breathing related problems. We know quitting is hard, and we are here to help.  Please let us know if there is anything we can do to help you meet your goal of quitting. If you are a former smoker, congratulations. We are proud of you! Remain smoke free! Remember you can refer friends or family members through the number above.  We will screen them to make sure they meet criteria for the program. Thank you for helping us take better care of you by  participating in Lung Screening.  You can receive free nicotine replacement therapy ( patches, gum or mints) by calling 1-800-QUIT NOW. Please call so we can get you on the path to becoming  a non-smoker. I know it is hard, but you can do this!  Lung RADS Categories:  Lung RADS 1: no nodules or definitely non-concerning nodules.  Recommendation is for a repeat annual scan in 12 months.  Lung RADS 2:  nodules that are non-concerning in appearance and behavior with a very low likelihood of becoming an active cancer. Recommendation is for a repeat annual scan in 12 months.  Lung RADS 3: nodules that are probably non-concerning , includes nodules with a low likelihood of becoming an active cancer.  Recommendation is for a 6-month repeat screening scan. Often noted after an upper respiratory illness. We will be in touch to make sure you have no questions, and to schedule your 6-month scan.  Lung RADS 4 A: nodules with concerning findings, recommendation is most often for a follow up scan in 3 months or additional testing based on our provider's assessment of the scan. We will be in touch to make sure you have no questions and to schedule the recommended 3 month follow up scan.  Lung RADS 4 B:  indicates findings that are concerning. We will be in touch with you to schedule additional diagnostic testing based on our provider's  assessment of the scan.  Other options for assistance in smoking cessation (   As covered by your insurance benefits)  Hypnosis for smoking cessation  [employer] [phone]  Acupuncture for smoking cessation  [facility] [phone]   

## 2021-07-04 ENCOUNTER — Ambulatory Visit (HOSPITAL_COMMUNITY)
Admission: RE | Admit: 2021-07-04 | Discharge: 2021-07-04 | Disposition: A | Discharge status: Home / Self Care | Payer: Medicare HMO | Source: Ambulatory Visit | Attending: Acute Care | Admitting: Acute Care

## 2021-07-04 DIAGNOSIS — Z87891 Personal history of nicotine dependence: Secondary | ICD-10-CM | POA: Insufficient documentation

## 2021-07-04 DIAGNOSIS — Z122 Encounter for screening for malignant neoplasm of respiratory organs: Secondary | ICD-10-CM | POA: Insufficient documentation

## 2021-07-10 ENCOUNTER — Telehealth: Payer: Self-pay | Admitting: *Deleted

## 2021-07-10 ENCOUNTER — Encounter: Payer: Self-pay | Admitting: Family Medicine

## 2021-07-10 ENCOUNTER — Ambulatory Visit: Payer: Medicare HMO | Admitting: Family Medicine

## 2021-07-10 DIAGNOSIS — Z122 Encounter for screening for malignant neoplasm of respiratory organs: Secondary | ICD-10-CM

## 2021-07-10 DIAGNOSIS — Z87891 Personal history of nicotine dependence: Secondary | ICD-10-CM

## 2021-07-10 NOTE — Telephone Encounter (Signed)
Called and spoke with pt and advised her that there were no suspicious lung nodules seen on her CT. Mild emphysema was noted. Also some coronary calcificatons noted as well. Pt is on Cholesterol med at this time. Also advised pt that there was a gallstone noted along with renal stone. Pt states that she does have some abdominal pain from time to time. I advised pt to discuss this with her PCP at next f/u. Copy of CT has been sent to PCP. Order placed for 12 mth f/u lung screening CT.

## 2021-07-10 NOTE — Progress Notes (Signed)
Medical Nutrition Therapy Appt start time: 1330 end time: 1430 (1 hour) Primary concerns today: Blood sugar control.  PCP Estela Philip Aspen, MD ([facility])   Relevant history/background: [name] was referred by PCP Chaya Jan, MD for MNT related to Type 2 Diabetes.  A1c was 6.4 in January and 6.6 on 05/08/21.  PMH of HTN, HLD, CKD stage III, hypothyroidism, and CAD.  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  Her first food of the day is usually at 10 AM, and she doesn't usually eat again till 6 PM.  [name] lives with her daughter and granddaughter with whom she shares dinner.    Assessment:   Learning Readiness: Change in progress; has reduced intake of starch foods.    Usual eating pattern: 1-2 meals and 1-4 snacks (most in the evening) per day. Frequent foods and beverages: water, diet green tea; chx, pasta, hotdogs (no bun), fruit.   Avoided foods: asparagus, milk, yogurt, cheese (lactose-intolerant).   Usual physical activity: Walks on TM at the gym 20-40 min 3 X wk.   Sleep: Estimates 7 hours per night.  Usually goes to bed at 10 PM, and is up a couple times a night.   24-hr recall: (Up at 6 AM) B (7:30 AM)-   Water with med's Snk ( AM)-   --- L (2 PM)-  1 [employer] grilled chx sandwich, 1 c waffle fries, 16 oz water Snk ( PM)-  --- D (8 PM)-  1 club sandwich ([place], ham, bacon, let, tom), diet green tea Snk (9 PM)-  3/4 c Jell-O with fruit, water Typical day? Yes.     Nutritional Diagnosis:  NB-1.1 Food and nutrition-related knowledge deficit As related to blood glucose management.  As evidenced by dietary practices  not conducive to stable blood glucose (meals with poor nutritional balance, i.e., no veg's, and 1-2 meals skipped daily).  Handouts given during visit include: After-Visit Summary (AVS)  Demonstrated degree of understanding via:  Teach Back  Barriers to  learning/adherence to lifestyle change: Limited knowledge of how food impacts BG levels.    Monitoring/Evaluation:  Dietary intake, exercise, and body weight in 10 week(s).

## 2021-07-10 NOTE — Patient Instructions (Addendum)
Hemoglobin A1c is a measure of your average blood sugar (glucose) levels over the past 3 months.  Pre-diabetes is a Hgb A1c of 5.7 to 6.5, and higher than 6.5 is diagnostic of diabetes.  Your A1c in April was 6.6.    Hyperglycemic = Raises blood glucose a lot.   Hypoglycemic = Low blood sugar.  1-2   Diet Recommendations for Diabetes  Carbohydrate includes starch, sugar, and fiber.  Of these, only sugar and starch raise blood glucose.  (Fiber is found in fruits, vegetables [especially skin, seeds, and stalks], whole grains, and beans.)   Starchy (carb) foods: Bread, rice, pasta, potatoes, corn, cereal, grits, crackers, bagels, muffins, all baked goods.  (Fruit, milk, and yogurt also have carbohydrate, but most of these foods will not spike your blood sugar as most starchy or sweet foods will.)  A few fruits do cause high blood sugars; use small portions of bananas (limit to 1/2 at a time), grapes, watermelon, and oranges.   The best carbohydrate is any carb food that includes FIBER.   Protein foods: Meat, fish, poultry, eggs, dairy foods, and beans such as pinto and kidney beans (beans also provide carbohydrate).   1. Eat at least 3 REAL meals and 1-3 snacks per day.  Eat breakfast within one hour of getting up.  Have something to eat at least every 5 hours while awake.  - A REAL breakfast needs to include both starch and protein foods.   - A REAL meal for lunch or dinner includes at least some protein, some starch, and vegetables and/or fruit.     2. Limit starchy foods to TWO portions per meal and ONE per snack. ONE portion of a starchy food is equal to the following:  - ONE slice of bread (or its equivalent, such as half of a hamburger bun).  - 1/2 cup of a "scoopable" starchy food such as potatoes or rice.  - 15 grams of Total Carbohydrate as shown on food label.   3. Include vegetables at least 7 meals per week.  - Fresh or frozen vegetables are best.  - Keep frozen vegetables on hand for  a quick option.      Follow-up appt on Monday, Sept 18 at 3:30 PM.    If you have questions on any of the above, call Dr. Gerilyn Pilgrim at [phone].

## 2021-08-04 ENCOUNTER — Encounter: Payer: Self-pay | Admitting: Internal Medicine

## 2021-08-04 ENCOUNTER — Ambulatory Visit (INDEPENDENT_AMBULATORY_CARE_PROVIDER_SITE_OTHER): Payer: Medicare HMO | Admitting: Internal Medicine

## 2021-08-04 VITALS — BP 130/80 | HR 63 | Temp 98.4°F | Wt 168.3 lb

## 2021-08-04 DIAGNOSIS — I252 Old myocardial infarction: Secondary | ICD-10-CM | POA: Diagnosis not present

## 2021-08-04 DIAGNOSIS — I2510 Atherosclerotic heart disease of native coronary artery without angina pectoris: Secondary | ICD-10-CM | POA: Diagnosis not present

## 2021-08-04 DIAGNOSIS — I10 Essential (primary) hypertension: Secondary | ICD-10-CM

## 2021-08-04 DIAGNOSIS — N183 Chronic kidney disease, stage 3 unspecified: Secondary | ICD-10-CM | POA: Diagnosis not present

## 2021-08-04 DIAGNOSIS — E1159 Type 2 diabetes mellitus with other circulatory complications: Secondary | ICD-10-CM | POA: Diagnosis not present

## 2021-08-04 DIAGNOSIS — E039 Hypothyroidism, unspecified: Secondary | ICD-10-CM | POA: Diagnosis not present

## 2021-08-04 DIAGNOSIS — E785 Hyperlipidemia, unspecified: Secondary | ICD-10-CM | POA: Diagnosis not present

## 2021-08-04 DIAGNOSIS — K8020 Calculus of gallbladder without cholecystitis without obstruction: Secondary | ICD-10-CM

## 2021-08-04 LAB — POCT GLYCOSYLATED HEMOGLOBIN (HGB A1C): Hemoglobin A1C: 6.5 % — AB (ref 4.0–5.6)

## 2021-08-04 NOTE — Progress Notes (Signed)
Established Patient Office Visit     CC/Reason for Visit: 7-month follow-up chronic medical conditions  HPI: Tasha Garrett is a 71 y.o. female who is coming in today for the above mentioned reasons. Past Medical History is significant for: Hypertension, hyperlipidemia, stage III chronic kidney disease, hypothyroidism, coronary artery disease status post non-ST elevated MI in 2012.  She recently had a low-dose CT scan for lung cancer screening that did not show any pulmonary nodules, however she was noted to have aortic atherosclerosis as well as a distended gallbladder with a gallbladder neck stone.  On further questioning she has noticed some early society and some mild nausea and stomach upset especially after heavy meals.  She has also noted some itching that is more prevalent at bedtime over her upper extremities and trunk.   Past Medical/Surgical History: Past Medical History:  Diagnosis Date   Blood in stool    Chronic kidney disease    High cholesterol    Hypertension    Hypothyroid    Urinary tract infection     Past Surgical History:  Procedure Laterality Date   ABDOMINAL HYSTERECTOMY     AORTIC VALVE REPLACEMENT (AVR)/CORONARY ARTERY BYPASS GRAFTING (CABG)      Social History:  reports that she quit smoking about 11 years ago. Her smoking use included cigarettes. She has never used smokeless tobacco. She reports current alcohol use. She reports that she does not currently use drugs.  Allergies: No Known Allergies  Family History:  Family History  Problem Relation Age of Onset   ALS Mother      Current Outpatient Medications:    albuterol (VENTOLIN HFA) 108 (90 Base) MCG/ACT inhaler, Inhale 2 puffs into the lungs as needed., Disp: , Rfl:    amLODipine (NORVASC) 5 MG tablet, TAKE 1 TABLET BY MOUTH EVERY DAY, Disp: 90 tablet, Rfl: 1   ASPIRIN 81 PO, Take 1 tablet by mouth daily., Disp: , Rfl:    atorvastatin (LIPITOR) 40 MG tablet, Take 1 tablet (40 mg total)  by mouth daily., Disp: 90 tablet, Rfl: 1   Cod Liver Oil CAPS, Take by mouth., Disp: , Rfl:    FLUoxetine (PROZAC) 20 MG capsule, Take 20 mg by mouth daily., Disp: , Rfl:    hydrocortisone (ANUSOL-HC) 2.5 % rectal cream, Place 1 application rectally 2 (two) times daily., Disp: 30 g, Rfl: 0   isosorbide mononitrate (IMDUR) 30 MG 24 hr tablet, Take 30 mg by mouth daily., Disp: , Rfl:    latanoprost (XALATAN) 0.005 % ophthalmic solution, 1 drop at bedtime., Disp: , Rfl:    levothyroxine (SYNTHROID) 137 MCG tablet, Take 137 mcg by mouth daily., Disp: , Rfl:    losartan (COZAAR) 100 MG tablet, Take 100 mg by mouth daily., Disp: , Rfl:    metoprolol tartrate (LOPRESSOR) 25 MG tablet, Take 25 mg by mouth 2 (two) times daily., Disp: , Rfl:    Multiple Vitamins-Minerals (MULTIVITAMIN ADULTS 50+ PO), Take 1 tablet by mouth daily., Disp: , Rfl:    terbinafine (LAMISIL) 250 MG tablet, Take 250 mg by mouth daily., Disp: , Rfl:   Review of Systems:  Constitutional: Denies fever, chills, diaphoresis, appetite change and fatigue.  HEENT: Denies photophobia, eye pain, redness, hearing loss, ear pain, congestion, sore throat, rhinorrhea, sneezing, mouth sores, trouble swallowing, neck pain, neck stiffness and tinnitus.   Respiratory: Denies SOB, DOE, cough, chest tightness,  and wheezing.   Cardiovascular: Denies chest pain, palpitations and leg swelling.  Gastrointestinal: Denies  nausea, vomiting, abdominal pain, diarrhea, constipation, blood in stool and abdominal distention.  Genitourinary: Denies dysuria, urgency, frequency, hematuria, flank pain and difficulty urinating.  Endocrine: Denies: hot or cold intolerance, sweats, changes in hair or nails, polyuria, polydipsia. Musculoskeletal: Denies myalgias, back pain, joint swelling, arthralgias and gait problem.  Skin: Denies pallor, rash and wound.  Neurological: Denies dizziness, seizures, syncope, weakness, light-headedness, numbness and headaches.   Hematological: Denies adenopathy. Easy bruising, personal or family bleeding history  Psychiatric/Behavioral: Denies suicidal ideation, mood changes, confusion, nervousness, sleep disturbance and agitation    Physical Exam: Vitals:   08/04/21 1531  BP: 130/80  Pulse: 63  Temp: 98.4 F (36.9 C)  TempSrc: Oral  SpO2: 98%  Weight: 168 lb 4.8 oz (76.3 kg)    Body mass index is 32.87 kg/m.   Constitutional: NAD, calm, comfortable Eyes: PERRL, lids and conjunctivae normal, wears corrective lenses ENMT: Mucous membranes are moist.  Respiratory: clear to auscultation bilaterally, no wheezing, no crackles. Normal respiratory effort. No accessory muscle use.  Cardiovascular: Regular rate and rhythm, no murmurs / rubs / gallops. No extremity edema.  Abdomen: no tenderness, no masses palpated.  Bowel sounds positive.  Psychiatric: Normal judgment and insight. Alert and oriented x 3. Normal mood.    Impression and Plan:  Type 2 diabetes mellitus with other circulatory complication, without long-term current use of insulin (HCC)  - Plan: POCT glycosylated hemoglobin (Hb A1C) -Well-controlled with an A1c of 6.5.  Hyperlipidemia with target LDL less than 70 -On atorvastatin 40 mg daily, last LDL cholesterol was 70 in April 2023.  Essential hypertension -Well-controlled  Hypothyroidism, unspecified type -She is on levothyroxine supplementation  Stage 3 chronic kidney disease, unspecified whether stage 3a or 3b CKD (HCC) -Creatinine is stable at around 1.3.  Coronary arteriosclerosis Hx of non-ST elevation myocardial infarction (NSTEMI) -Followed by cardiology, on statin.  Calculus of gallbladder without cholecystitis without obstruction  - Plan: US ABDOMEN LIMITED RUQ (LIVER/GB), CBC with Differential/Platelet, Comprehensive metabolic panel -This was noted incidentally on CT scan of the chest, on further questioning she has been having some abdominal pain, nausea, early satiety  and itchy.  I feel it is reasonable to check LFTs as well as right upper quadrant ultrasound for more information.    Time spent:32 minutes reviewing chart, interviewing and examining patient and formulating plan of care.    Chaya Jan, MD [facility]

## 2021-08-05 LAB — COMPREHENSIVE METABOLIC PANEL
ALT: 17 U/L (ref 0–35)
AST: 17 U/L (ref 0–37)
Albumin: 4.2 g/dL (ref 3.5–5.2)
Alkaline Phosphatase: 112 U/L (ref 39–117)
BUN: 23 mg/dL (ref 6–23)
CO2: 26 mEq/L (ref 19–32)
Calcium: 10.4 mg/dL (ref 8.4–10.5)
Chloride: 105 mEq/L (ref 96–112)
Creatinine, Ser: 1.2 mg/dL (ref 0.40–1.20)
GFR: 45.62 mL/min — ABNORMAL LOW (ref >60.00)
Glucose, Bld: 146 mg/dL — ABNORMAL HIGH (ref 70–99)
Potassium: 4.2 mEq/L (ref 3.5–5.1)
Sodium: 140 mEq/L (ref 135–145)
Total Bilirubin: 0.4 mg/dL (ref 0.2–1.2)
Total Protein: 7 g/dL (ref 6.0–8.3)

## 2021-08-05 LAB — CBC WITH DIFFERENTIAL/PLATELET
Basophils Absolute: 0.1 10*3/uL (ref 0.0–0.1)
Basophils Relative: 1.5 % (ref 0.0–3.0)
Eosinophils Absolute: 0.4 10*3/uL (ref 0.0–0.7)
Eosinophils Relative: 4.3 % (ref 0.0–5.0)
HCT: 36.5 % (ref 36.0–46.0)
Hemoglobin: 11.6 g/dL — ABNORMAL LOW (ref 12.0–15.0)
Lymphocytes Relative: 27.5 % (ref 12.0–46.0)
Lymphs Abs: 2.3 10*3/uL (ref 0.7–4.0)
MCHC: 31.7 g/dL (ref 30.0–36.0)
MCV: 90.5 fl (ref 78.0–100.0)
Monocytes Absolute: 0.8 10*3/uL (ref 0.1–1.0)
Monocytes Relative: 9.7 % (ref 3.0–12.0)
Neutro Abs: 4.9 10*3/uL (ref 1.4–7.7)
Neutrophils Relative %: 57 % (ref 43.0–77.0)
Platelets: 308 10*3/uL (ref 150.0–400.0)
RBC: 4.03 Mil/uL (ref 3.87–5.11)
RDW: 13.1 % (ref 11.5–15.5)
WBC: 8.5 10*3/uL (ref 4.0–10.5)

## 2021-08-07 ENCOUNTER — Ambulatory Visit (HOSPITAL_BASED_OUTPATIENT_CLINIC_OR_DEPARTMENT_OTHER)
Admission: RE | Admit: 2021-08-07 | Discharge: 2021-08-07 | Disposition: A | Discharge status: Home / Self Care | Payer: Medicare HMO | Source: Ambulatory Visit | Attending: Internal Medicine | Admitting: Internal Medicine

## 2021-08-07 ENCOUNTER — Telehealth: Payer: Self-pay | Admitting: *Deleted

## 2021-08-07 ENCOUNTER — Ambulatory Visit: Payer: Medicare HMO | Admitting: Internal Medicine

## 2021-08-07 DIAGNOSIS — K8020 Calculus of gallbladder without cholecystitis without obstruction: Secondary | ICD-10-CM | POA: Insufficient documentation

## 2021-08-07 NOTE — Telephone Encounter (Signed)
Spoke with patient.

## 2021-08-11 ENCOUNTER — Other Ambulatory Visit: Payer: Self-pay

## 2021-08-11 DIAGNOSIS — K8020 Calculus of gallbladder without cholecystitis without obstruction: Secondary | ICD-10-CM

## 2021-08-28 ENCOUNTER — Telehealth: Payer: Self-pay | Admitting: Internal Medicine

## 2021-08-29 ENCOUNTER — Ambulatory Visit: Payer: Medicare HMO | Admitting: Family Medicine

## 2021-08-29 NOTE — Telephone Encounter (Signed)
Error Please Disregard

## 2021-09-01 ENCOUNTER — Ambulatory Visit (INDEPENDENT_AMBULATORY_CARE_PROVIDER_SITE_OTHER): Payer: Medicare HMO | Admitting: Family Medicine

## 2021-09-01 ENCOUNTER — Ambulatory Visit: Payer: Medicare HMO | Admitting: Internal Medicine

## 2021-09-01 ENCOUNTER — Encounter: Payer: Self-pay | Admitting: Family Medicine

## 2021-09-01 VITALS — BP 120/70 | HR 59 | Temp 97.9°F | Wt 165.4 lb

## 2021-09-01 DIAGNOSIS — E1159 Type 2 diabetes mellitus with other circulatory complications: Secondary | ICD-10-CM

## 2021-09-01 DIAGNOSIS — R350 Frequency of micturition: Secondary | ICD-10-CM

## 2021-09-01 DIAGNOSIS — J432 Centrilobular emphysema: Secondary | ICD-10-CM | POA: Diagnosis not present

## 2021-09-01 DIAGNOSIS — K649 Unspecified hemorrhoids: Secondary | ICD-10-CM

## 2021-09-01 LAB — POCT URINALYSIS DIPSTICK
Bilirubin, UA: NEGATIVE
Blood, UA: NEGATIVE
Glucose, UA: NEGATIVE
Ketones, UA: NEGATIVE
Leukocytes, UA: NEGATIVE
Nitrite, UA: NEGATIVE
Protein, UA: POSITIVE — AB
Spec Grav, UA: 1.025 (ref 1.010–1.025)
Urobilinogen, UA: 0.2 E.U./dL
pH, UA: 6 (ref 5.0–8.0)

## 2021-09-01 NOTE — Progress Notes (Signed)
Subjective:    Patient ID: Tasha Garrett, female    DOB: 1950-02-27, 71 y.o.   MRN: [id number]  Chief Complaint  Patient presents with   Hemorrhoids   Dysuria    HPI Patient was seen today for multiple ongoing concerns.  Patient endorses increased urination at night.  Drinking tea during the day and some water.  Patient with history of DM.  States blood sugars been good.  May eat pork skins at lunch.  Patient with increased wheezing/SOB more at night.  Has albuterol inhaler.  Patient is a former smoker quit 2012.  Patient notes increased BMs, 3/day times last 2 weeks.  Causing hemorrhoids with pruritus/discomfort.  Denies current bleeding.  Pt has not tried anything for her symptoms.  Past Medical History:  Diagnosis Date   Blood in stool    Chronic kidney disease    High cholesterol    Hypertension    Hypothyroid    Urinary tract infection     No Known Allergies  ROS General: Denies fever, chills, night sweats, changes in weight, changes in appetite HEENT: Denies headaches, ear pain, changes in vision, rhinorrhea, sore throat CV: Denies CP, palpitations, SOB, orthopnea  Pulm: Denies SOB, cough + wheezing GI: Denies abdominal pain, nausea, vomiting, diarrhea, constipation  + frequent stools, hemorrhoids GU: Denies dysuria, hematuria, vaginal discharge  + urinary frequency Msk: Denies muscle cramps, joint pains Neuro: Denies weakness, numbness, tingling Skin: Denies rashes, bruising Psych: Denies depression, anxiety, hallucinations     Objective:    Blood pressure 120/70, pulse (!) 59, temperature 97.9 F (36.6 C), temperature source Oral, weight 165 lb 6.4 oz (75 kg), SpO2 97 %.  Gen. Pleasant, well-nourished, in no distress, normal affect   HEENT: Concrete/AT, face symmetric, conjunctiva clear, no scleral icterus, PERRLA, EOMI, nares patent without drainage Lungs: no accessory muscle use, CTAB, no wheezes or rales Cardiovascular: RRR, no m/r/g, no peripheral edema Abdomen:  BS present, soft, NT/ND Musculoskeletal: No deformities, no cyanosis or clubbing, normal tone Neuro:  A&Ox3, CN II-XII intact, normal gait Skin:  Warm, no lesions/ rash   Wt Readings from Last 3 Encounters:  09/01/21 165 lb 6.4 oz (75 kg)  08/04/21 168 lb 4.8 oz (76.3 kg)  07/10/21 165 lb 9.6 oz (75.1 kg)    Lab Results  Component Value Date   WBC 8.5 08/04/2021   HGB 11.6 (L) 08/04/2021   HCT 36.5 08/04/2021   PLT 308.0 08/04/2021   GLUCOSE 146 (H) 08/04/2021   CHOL 125 05/08/2021   TRIG 135.0 05/08/2021   HDL 28.00 (L) 05/08/2021   LDLDIRECT 100.0 02/04/2021   LDLCALC 70 05/08/2021   ALT 17 08/04/2021   AST 17 08/04/2021   NA 140 08/04/2021   K 4.2 08/04/2021   CL 105 08/04/2021   CREATININE 1.20 08/04/2021   BUN 23 08/04/2021   CO2 26 08/04/2021   TSH 1.62 02/04/2021   HGBA1C 6.5 (A) 08/04/2021   MICROALBUR 44.8 (H) 05/08/2021    Assessment/Plan:  Centrilobular emphysema (HCC)  -Former smoker.  Quit 2012. -CT chest lung cancer screen 07/04/21 with mild centrilobular emphysema.  Numerous bilateral pulmonary nodules up to 5.2 mm, no suspicious pulmonary nodules.  Distended gallbladder with a 1.8 cm stone in the gallbladder neck.  Bilateral renal stones.  Aortic atherosclerosis. -Continue albuterol inhaler as needed. - Plan: Ambulatory referral to Pulmonology  Urinary frequency -Discussed decreasing caffeine intake and intake of sugary beverages as may be contributing to bladder irritation -We will obtain UA to evaluate  for UTI. -The importance of glycemic control discussed and may be contributing to frequency.  Hemoglobin A1c 6.5% on 08/04/2021  - Plan: POCT urinalysis dipstick  Hemorrhoids, unspecified hemorrhoid type -Discussed increasing p.o. intake of water and fiber to decrease straining. -Offered hemorrhoid medication.  Patient declines at this time. -Discussed OTC hemorrhoid products or sitz bath. -Given strict precautions for bleeding or darker stools.   - Plan: Ambulatory referral to Gastroenterology  Type 2 diabetes mellitus with other circulatory complication, without long-term current use of insulin (HCC) -Hemoglobin A1c 6.5% on 08/04/2021 -Continue current medications -Continue lifestyle modifications  F/u as needed  Abbe Amsterdam, MD

## 2021-09-02 ENCOUNTER — Telehealth: Payer: Medicare HMO | Admitting: Internal Medicine

## 2021-09-02 ENCOUNTER — Ambulatory Visit: Payer: Medicare HMO | Admitting: Family Medicine

## 2021-09-04 DIAGNOSIS — M25511 Pain in right shoulder: Secondary | ICD-10-CM | POA: Diagnosis not present

## 2021-09-08 ENCOUNTER — Other Ambulatory Visit: Payer: Self-pay | Admitting: Internal Medicine

## 2021-09-08 DIAGNOSIS — E785 Hyperlipidemia, unspecified: Secondary | ICD-10-CM

## 2021-09-12 DIAGNOSIS — H401131 Primary open-angle glaucoma, bilateral, mild stage: Secondary | ICD-10-CM | POA: Diagnosis not present

## 2021-09-12 DIAGNOSIS — H35363 Drusen (degenerative) of macula, bilateral: Secondary | ICD-10-CM | POA: Diagnosis not present

## 2021-09-25 NOTE — Progress Notes (Deleted)
Medical Nutrition Therapy Appt start time: 1530 end time: 1630 (1 hour) Primary concerns today: Blood sugar control.  PCP Estela Philip Aspen, MD ([facility])   Relevant history/background: Ms. Mcelveen was referred by PCP Chaya Jan, MD for MNT related to Type 2 Diabetes (Type 2 diabetes mellitus with other circulatory complication, without long-term current use of insulin [HCC]).  A1c was 6.4 in January and 6.6 on 05/08/21.  PMH of HTN, HLD, CKD stage III, hypothyroidism, and CAD.  Ms. 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  Her first food of the day is usually at 10 AM, and she doesn't usually eat again till 6 PM.  Ms. Warning lives with her daughter and granddaughter with whom she shares dinner.    Assessment:   Learning Readiness: Change in progress; has reduced intake of starch foods.    Usual eating pattern: ***1-2 meals and 1-4 snacks (most in the evening) per day.   Usual physical activity: ***Walks on TM at the gym 20-40 min 3 X wk.   Sleep: Estimates ***7 hours per night.    24-hr recall suggests intake of *** kcal:  (Up at  AM) B ( AM)-   Snk ( AM)-   L ( PM)-   Snk ( PM)-   D ( PM)-   Snk ( PM)-   Typical day? {yes [id number]}  Nutritional Diagnosis: *** progress on NB-1.1 Food and nutrition-related knowledge deficit As related to blood glucose management.  As evidenced by dietary practices  not conducive to stable blood glucose (meals with poor nutritional balance, i.e., no veg's, and 1-2 meals skipped daily). ***  Handouts given during visit include: After-Visit Summary (AVS)  Demonstrated degree of understanding via:  Teach Back  Barriers to learning/adherence to lifestyle change: Limited knowledge of how food impacts BG levels.    Monitoring/Evaluation:  Dietary intake, exercise, and body weight in 10 week(s).  ***  From 6/29 *** 1. Eat at least 3 REAL meals and 1-3 snacks per day.   Eat breakfast within one hour of getting up.  Have something to eat at least every 5 hours while awake.  - A REAL breakfast needs to include both starch and protein foods.   - A REAL meal for lunch or dinner includes at least some protein, some starch, and vegetables and/or fruit.   2. Limit starchy foods to TWO portions per meal and ONE per snack. ONE portion of a starchy food is equal to the following:  - ONE slice of bread (or its equivalent, such as half of a hamburger bun).  - 1/2 cup of a "scoopable" starchy food such as potatoes or rice.  - 15 grams of Total Carbohydrate as shown on food label.  3. Include vegetables in at least 7 meals per week.  - Fresh or frozen vegetables are best.  - Keep frozen vegetables on hand for a quick option.       Follow-up appt on ***.

## 2021-09-29 ENCOUNTER — Ambulatory Visit: Payer: Medicare HMO | Admitting: Family Medicine

## 2021-10-02 ENCOUNTER — Telehealth: Payer: Self-pay | Admitting: Internal Medicine

## 2021-10-02 ENCOUNTER — Institutional Professional Consult (permissible substitution): Payer: Medicare HMO | Admitting: Pulmonary Disease

## 2021-10-02 DIAGNOSIS — I10 Essential (primary) hypertension: Secondary | ICD-10-CM

## 2021-10-02 MED ORDER — AMLODIPINE BESYLATE 5 MG PO TABS: 5.0000 mg | ORAL_TABLET | Freq: Every day | ORAL | 1 refills | Status: DC

## 2021-10-02 NOTE — Telephone Encounter (Signed)
Rx sent in

## 2021-10-02 NOTE — Telephone Encounter (Signed)
Refill amLODipine (NORVASC) 5 MG tablet   [facility] - [place], [place] Phone:  [phone]  Fax:  [phone]

## 2021-10-03 NOTE — Telephone Encounter (Signed)
Called pt get clarification on amlodipine 5mg  vs 10mg . Upon review of chart, there is no documentation that dosage was changed to 10mg .  Discussed this with pt. She states Dr Jerilee Hoh told her to try taking 10mg  to help control BP but she does not recall when she was advised to do that.  Advised pt that I needed to clarify with PCP before changing dosage. Pt verb understanding & will take 5mg  now to stretch medication. Pt is aware that PCP returns to office on Monday.

## 2021-10-03 NOTE — Telephone Encounter (Signed)
Pt called to say MD was supposed to send the 10mg  for the amlodipine (90 day supply), not the 5mg .  Please review and advise.

## 2021-10-06 NOTE — Telephone Encounter (Signed)
Doesn't sound like a recommendation I would make and there is no documentation of it. Has she been taking 10 mg? Does she have ankle swelling? If she is tolerating 10 mg well ok to continue.  [place]

## 2021-10-14 MED ORDER — AMLODIPINE BESYLATE 10 MG PO TABS: 10.0000 mg | ORAL_TABLET | Freq: Every day | ORAL | 1 refills | Status: DC

## 2021-10-14 NOTE — Addendum Note (Signed)
Addended by: Westley Hummer B on: 10/14/2021 04:26 PM   Modules accepted: Orders

## 2021-10-14 NOTE — Telephone Encounter (Signed)
Patient has been tolerating Amlodipie 10 mg well.  Patient states that" it is bringing her blood pressure down." Refill sent

## 2021-10-23 ENCOUNTER — Encounter: Payer: Self-pay | Admitting: Pulmonary Disease

## 2021-10-23 ENCOUNTER — Ambulatory Visit (INDEPENDENT_AMBULATORY_CARE_PROVIDER_SITE_OTHER): Payer: Medicare HMO | Admitting: Pulmonary Disease

## 2021-10-23 VITALS — BP 136/72 | HR 58 | Ht 61.0 in | Wt 164.6 lb

## 2021-10-23 DIAGNOSIS — J438 Other emphysema: Secondary | ICD-10-CM | POA: Diagnosis not present

## 2021-10-23 DIAGNOSIS — R0609 Other forms of dyspnea: Secondary | ICD-10-CM

## 2021-10-23 MED ORDER — BUDESONIDE-FORMOTEROL FUMARATE 160-4.5 MCG/ACT IN AERO: 2.0000 | INHALATION_SPRAY | Freq: Two times a day (BID) | RESPIRATORY_TRACT | 12 refills | Status: DC

## 2021-10-23 NOTE — Patient Instructions (Signed)
Nice to meet you  Given your history of wheeze and cough, I think we should use an inhaler to try to treat both.  Use Symbicort 2 puffs twice a day every day.  Rinse your mouth out with water after every use.  If the cost of the medication is too high please let me know and I will look for a solution.  Continue albuterol as needed for wheeze or shortness of breath.  Some of the cough sounds like it is related to food or drink going down the wrong tube when you swallow.  This can be hard to fix.  If this is an ongoing issue I think we should see a speech pathologist in the future.  If your shortness of breath does not improve with the inhaler and I think we should pursue pulmonary function test.  We can discuss further at your next office visit.  Return to clinic in 3 months or sooner as needed with Dr. Silas Flood

## 2021-10-23 NOTE — Progress Notes (Signed)
@Patient  ID: , female    DOB: 02/21/1950, 71 y.o.   MRN: [id number]  Chief Complaint  Patient presents with   Consult    Consult for centrilobular emphysema. Pt states she is unsure when she was diagnosed. Pt states she is on albuterol to help with her breathing. Pt states that it does help. Pt stopped smoking 10 years ago.     Referring provider: [id number], MD  HPI:   71 y.o. woman whom we are seeing in consultation for evaluation of dyspnea on exertion.  Note from referring provider reviewed.  Patient is known dyspnea for the last several months to couple years.  Worse on inclines or stairs.  No time of day when things are better or worse.  No seasonal environmental factors she can identify that make things better or worse.  No position that makes things better or worse.  She is used albuterol as needed.  She is not sure this helps it much at all.  No other alleviating or exacerbating factors.  Most recent chest imaging CT lung cancer 06/2021 personally reviewed and interpreted as mild emphysematous changes at the apices, mild lower lobe bronchiectasis, otherwise clear.  PMH: Hypertension, hyperlipidemia Surgical history: Hysterectomy, aortic valve replacement/CABG Family history: Mother with ALS Social history: Former smoker, 10-pack-year, quit 2012, lives in [place], works in [place] at [employer]	[place] / Pulmonary Flowsheets:   ACT:      No data to display          MMRC:     No data to display          Epworth:      No data to display          Tests:   FENO:  No results found for: "NITRICOXIDE"  PFT:     No data to display          WALK:      No data to display          Imaging: Personally reviewed and as per EMR discussion in this note No results found.  Lab Results: Personally reviewed, notably eosinophils 400 07/2021 CBC    Component Value Date/Time   WBC 8.5  08/04/2021 1556   RBC 4.03 08/04/2021 1556   HGB 11.6 (L) 08/04/2021 1556   HCT 36.5 08/04/2021 1556   PLT 308.0 08/04/2021 1556   MCV 90.5 08/04/2021 1556   MCHC 31.7 08/04/2021 1556   RDW 13.1 08/04/2021 1556   LYMPHSABS 2.3 08/04/2021 1556   MONOABS 0.8 08/04/2021 1556   EOSABS 0.4 08/04/2021 1556   BASOSABS 0.1 08/04/2021 1556    BMET    Component Value Date/Time   NA 140 08/04/2021 1556   NA 143 05/27/2021 0000   K 4.2 08/04/2021 1556   CL 105 08/04/2021 1556   CO2 26 08/04/2021 1556   GLUCOSE 146 (H) 08/04/2021 1556   BUN 23 08/04/2021 1556   CREATININE 1.20 08/04/2021 1556   CALCIUM 10.4 08/04/2021 1556    BNP No results found for: "BNP"  ProBNP No results found for: "PROBNP"  Specialty Problems   None   No Known Allergies  Immunization History  Administered Date(s) Administered   Influenza, High Dose Seasonal PF 11/04/2015, 11/17/2016, 11/18/2017, 11/24/2018, 10/03/2019, 09/30/2020   Influenza-Unspecified 10/26/2020   Moderna Sars-Covid-2 Vaccination 03/13/2019, 04/08/2019, 03/01/2020   Pneumococcal Conjugate-13 12/14/2017   Pneumococcal Polysaccharide-23 05/25/2016   Td (Adult),5 Lf Tetanus Toxid, Preservative Free 10/22/2017  Tdap 10/22/2017    Past Medical History:  Diagnosis Date   Blood in stool    Chronic kidney disease    High cholesterol    Hypertension    Hypothyroid    Urinary tract infection     Tobacco History: Social History   Tobacco Use  Smoking Status Former   Packs/day: 0.50   Years: 20.00   Total pack years: 10.00   Types: Cigarettes   Quit date: 04/2010   Years since quitting: 11.5  Smokeless Tobacco Never  Tobacco Comments   Pt states she stopped smoking 10 years ago and at most smoked 1/2 ppd. ALS 10/12   Counseling given: Not Answered Tobacco comments: Pt states she stopped smoking 10 years ago and at most smoked 1/2 ppd. ALS 10/12   Continue to not smoke  Outpatient Encounter Medications as of 10/23/2021   Medication Sig   albuterol (VENTOLIN HFA) 108 (90 Base) MCG/ACT inhaler Inhale 2 puffs into the lungs as needed.   amLODipine (NORVASC) 10 MG tablet Take 1 tablet (10 mg total) by mouth daily.   ASPIRIN 81 PO Take 1 tablet by mouth daily.   atorvastatin (LIPITOR) 40 MG tablet TAKE 1 TABLET EVERY DAY   budesonide-formoterol (SYMBICORT) 160-4.5 MCG/ACT inhaler Inhale 2 puffs into the lungs in the morning and at bedtime.   Cod Liver Oil CAPS Take by mouth.   FLUoxetine (PROZAC) 20 MG capsule Take 20 mg by mouth daily.   hydrocortisone (ANUSOL-HC) 2.5 % rectal cream Place 1 application rectally 2 (two) times daily.   isosorbide mononitrate (IMDUR) 30 MG 24 hr tablet Take 30 mg by mouth daily.   latanoprost (XALATAN) 0.005 % ophthalmic solution 1 drop at bedtime.   levothyroxine (SYNTHROID) 137 MCG tablet Take 137 mcg by mouth daily.   losartan (COZAAR) 100 MG tablet Take 100 mg by mouth daily.   metoprolol tartrate (LOPRESSOR) 25 MG tablet Take 25 mg by mouth 2 (two) times daily.   Multiple Vitamins-Minerals (MULTIVITAMIN ADULTS 50+ PO) Take 1 tablet by mouth daily.   terbinafine (LAMISIL) 250 MG tablet Take 250 mg by mouth daily.   No facility-administered encounter medications on file as of 10/23/2021.     Review of Systems  Review of Systems  No chest pain with exertion.  No orthopnea or PND.  Comprehensive review of systems otherwise negative. Physical Exam  BP 136/72 (BP Location: Left Arm, Patient Position: Sitting, Cuff Size: Normal)   Pulse (!) 58   Ht 5\' 1"  (1.549 m)   Wt 164 lb 9.6 oz (74.7 kg)   SpO2 95%   BMI 31.10 kg/m   Wt Readings from Last 5 Encounters:  10/23/21 164 lb 9.6 oz (74.7 kg)  09/01/21 165 lb 6.4 oz (75 kg)  08/04/21 168 lb 4.8 oz (76.3 kg)  07/10/21 165 lb 9.6 oz (75.1 kg)  05/08/21 164 lb 14.4 oz (74.8 kg)    BMI Readings from Last 5 Encounters:  10/23/21 31.10 kg/m  09/01/21 32.30 kg/m  08/04/21 32.87 kg/m  07/10/21 32.34 kg/m  05/08/21  32.14 kg/m     Physical Exam General: Sitting in chair, no acute distress Eyes: EOMI, no icterus Neck: Supple, no JVP Pulmonary: Clear, normal work of breathing Cardiovascular: Warm, no edema Abdomen: Nondistended, bowel sounds present MSK: No synovitis, no joint effusion Neuro: Normal gait, no weakness Psych: Normal mood, full affect   Assessment & Plan:   Dyspnea on exertion, wheeze, cough: Asthma versus smoking-related lung disease.  Emphysema albeit mild  on prior CT scan.  No PFTs in the past.  Trial Symbicort high-dose 2 puffs twice a day.  Continue albuterol as needed.  PFTs in the future if not improving.  Cough: Likely multifactorial.  Related to smoking-related disease or asthma as above.  She also cleared describes aspiration symptoms when she swallows.  She declined speech pathologist evaluation.  She has bilateral lower lobe bronchiectasis likely in the setting of chronic aspiration.  Trial of inhaler as above.   Return in about 3 months (around 01/23/2022).   Lanier Clam, MD 10/23/2021

## 2021-11-04 ENCOUNTER — Ambulatory Visit: Payer: Medicare HMO | Admitting: Internal Medicine

## 2021-11-11 ENCOUNTER — Encounter: Payer: Self-pay | Admitting: Internal Medicine

## 2021-11-11 ENCOUNTER — Ambulatory Visit (INDEPENDENT_AMBULATORY_CARE_PROVIDER_SITE_OTHER): Payer: Medicare HMO | Admitting: Internal Medicine

## 2021-11-11 VITALS — BP 129/62 | HR 63 | Temp 97.6°F | Wt 166.2 lb

## 2021-11-11 DIAGNOSIS — Z1211 Encounter for screening for malignant neoplasm of colon: Secondary | ICD-10-CM

## 2021-11-11 DIAGNOSIS — Z1231 Encounter for screening mammogram for malignant neoplasm of breast: Secondary | ICD-10-CM | POA: Diagnosis not present

## 2021-11-11 DIAGNOSIS — E785 Hyperlipidemia, unspecified: Secondary | ICD-10-CM

## 2021-11-11 DIAGNOSIS — I10 Essential (primary) hypertension: Secondary | ICD-10-CM

## 2021-11-11 DIAGNOSIS — R809 Proteinuria, unspecified: Secondary | ICD-10-CM

## 2021-11-11 DIAGNOSIS — E1159 Type 2 diabetes mellitus with other circulatory complications: Secondary | ICD-10-CM | POA: Diagnosis not present

## 2021-11-11 DIAGNOSIS — I2510 Atherosclerotic heart disease of native coronary artery without angina pectoris: Secondary | ICD-10-CM | POA: Diagnosis not present

## 2021-11-11 DIAGNOSIS — E039 Hypothyroidism, unspecified: Secondary | ICD-10-CM

## 2021-11-11 DIAGNOSIS — N183 Chronic kidney disease, stage 3 unspecified: Secondary | ICD-10-CM | POA: Diagnosis not present

## 2021-11-11 DIAGNOSIS — E1129 Type 2 diabetes mellitus with other diabetic kidney complication: Secondary | ICD-10-CM

## 2021-11-11 DIAGNOSIS — Z23 Encounter for immunization: Secondary | ICD-10-CM | POA: Diagnosis not present

## 2021-11-11 LAB — POCT GLYCOSYLATED HEMOGLOBIN (HGB A1C): Hemoglobin A1C: 6.7 % — AB (ref 4.0–5.6)

## 2021-11-11 NOTE — Addendum Note (Signed)
Addended by: Westley Hummer B on: 11/11/2021 04:14 PM   Modules accepted: Orders
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
## 2021-11-22 ENCOUNTER — Ambulatory Visit (HOSPITAL_BASED_OUTPATIENT_CLINIC_OR_DEPARTMENT_OTHER)
Admission: RE | Admit: 2021-11-22 | Discharge: 2021-11-22 | Disposition: A | Discharge status: Home / Self Care | Payer: Medicare HMO | Source: Ambulatory Visit | Attending: Internal Medicine | Admitting: Internal Medicine

## 2021-11-22 DIAGNOSIS — Z1231 Encounter for screening mammogram for malignant neoplasm of breast: Secondary | ICD-10-CM | POA: Diagnosis not present

## 2021-11-26 DIAGNOSIS — D649 Anemia, unspecified: Secondary | ICD-10-CM | POA: Diagnosis not present

## 2021-11-26 DIAGNOSIS — E559 Vitamin D deficiency, unspecified: Secondary | ICD-10-CM | POA: Diagnosis not present

## 2021-11-26 DIAGNOSIS — Z1211 Encounter for screening for malignant neoplasm of colon: Secondary | ICD-10-CM | POA: Diagnosis not present

## 2021-11-26 DIAGNOSIS — I10 Essential (primary) hypertension: Secondary | ICD-10-CM | POA: Diagnosis not present

## 2021-11-26 DIAGNOSIS — N1831 Chronic kidney disease, stage 3a: Secondary | ICD-10-CM | POA: Diagnosis not present

## 2021-12-06 LAB — COLOGUARD: COLOGUARD: NEGATIVE

## 2021-12-08 DIAGNOSIS — N1831 Chronic kidney disease, stage 3a: Secondary | ICD-10-CM | POA: Diagnosis not present

## 2021-12-08 DIAGNOSIS — N2581 Secondary hyperparathyroidism of renal origin: Secondary | ICD-10-CM | POA: Diagnosis not present

## 2021-12-08 DIAGNOSIS — I10 Essential (primary) hypertension: Secondary | ICD-10-CM | POA: Diagnosis not present

## 2021-12-26 ENCOUNTER — Telehealth: Payer: Self-pay

## 2021-12-26 DIAGNOSIS — M13851 Other specified arthritis, right hip: Secondary | ICD-10-CM | POA: Diagnosis not present

## 2021-12-26 DIAGNOSIS — M25551 Pain in right hip: Secondary | ICD-10-CM | POA: Diagnosis not present

## 2021-12-26 DIAGNOSIS — M545 Low back pain, unspecified: Secondary | ICD-10-CM | POA: Diagnosis not present

## 2021-12-26 DIAGNOSIS — I10 Essential (primary) hypertension: Secondary | ICD-10-CM | POA: Diagnosis not present

## 2021-12-26 DIAGNOSIS — M5136 Other intervertebral disc degeneration, lumbar region: Secondary | ICD-10-CM | POA: Diagnosis not present

## 2021-12-26 NOTE — Telephone Encounter (Signed)
Caller states she has pain in her right upper thigh. It is getting worse and it is making it hard to walk. Began a week ago and worse yesterday. Bending over made it worse.  12/26/2021 8:29:41 AM Go to ED Now (or PCP triage) Suezanne Jacquet, RN, Riley Lam  Comments User: Cameron Proud, RN Date/Time Lamount Cohen Time): 12/26/2021 8:32:18 AM No app on the back line. Per Candice.  Appt scheduled with PCP on 12/29/21

## 2021-12-29 ENCOUNTER — Ambulatory Visit (INDEPENDENT_AMBULATORY_CARE_PROVIDER_SITE_OTHER): Payer: Medicare HMO | Admitting: Internal Medicine

## 2021-12-29 ENCOUNTER — Ambulatory Visit (INDEPENDENT_AMBULATORY_CARE_PROVIDER_SITE_OTHER): Payer: Medicare HMO

## 2021-12-29 VITALS — BP 130/58 | HR 55 | Temp 98.3°F | Wt 166.0 lb

## 2021-12-29 DIAGNOSIS — M76891 Other specified enthesopathies of right lower limb, excluding foot: Secondary | ICD-10-CM

## 2021-12-29 DIAGNOSIS — M1611 Unilateral primary osteoarthritis, right hip: Secondary | ICD-10-CM | POA: Diagnosis not present

## 2021-12-29 NOTE — Progress Notes (Signed)
Established Patient Office Visit     CC/Reason for Visit: Follow-up urgent care  HPI: Tasha Garrett is a 71 y.o. female who is coming in today for the above mentioned reasons.  Last week she fell at home while kneeling on her right knee.  It appears her knee slipped backward and she hyperextended her anterior groin.  Ever since then she has been having severe pain walking and climbing steps.  She went to an urgent care in [place], no records are available to me at this time.  She tells me they did an x-ray of her hip and told her it was arthritis and gave her Voltaren gel.  They did not do frog leg x-rays.   Past Medical/Surgical History: Past Medical History:  Diagnosis Date   Blood in stool    Chronic kidney disease    High cholesterol    Hypertension    Hypothyroid    Urinary tract infection     Past Surgical History:  Procedure Laterality Date   ABDOMINAL HYSTERECTOMY     AORTIC VALVE REPLACEMENT (AVR)/CORONARY ARTERY BYPASS GRAFTING (CABG)      Social History:  reports that she quit smoking about 11 years ago. Her smoking use included cigarettes. She has a 10.00 pack-year smoking history. She has never used smokeless tobacco. She reports current alcohol use. She reports that she does not currently use drugs.  Allergies: No Known Allergies  Family History:  Family History  Problem Relation Age of Onset   ALS Mother      Current Outpatient Medications:    amLODipine (NORVASC) 10 MG tablet, Take 1 tablet (10 mg total) by mouth daily., Disp: 90 tablet, Rfl: 1   ASPIRIN 81 PO, Take 1 tablet by mouth daily., Disp: , Rfl:    atorvastatin (LIPITOR) 40 MG tablet, TAKE 1 TABLET EVERY DAY, Disp: 90 tablet, Rfl: 1   budesonide-formoterol (SYMBICORT) 160-4.5 MCG/ACT inhaler, Inhale 2 puffs into the lungs in the morning and at bedtime., Disp: 1 each, Rfl: 12   Cod Liver Oil CAPS, Take by mouth., Disp: , Rfl:    diclofenac Sodium (VOLTAREN) 1 % GEL, Apply 2 g topically 4  (four) times daily., Disp: , Rfl:    isosorbide mononitrate (IMDUR) 30 MG 24 hr tablet, Take 30 mg by mouth daily., Disp: , Rfl:    latanoprost (XALATAN) 0.005 % ophthalmic solution, 1 drop at bedtime., Disp: , Rfl:    levothyroxine (SYNTHROID) 137 MCG tablet, Take 137 mcg by mouth daily., Disp: , Rfl:    losartan (COZAAR) 100 MG tablet, Take 100 mg by mouth daily., Disp: , Rfl:    methocarbamol (ROBAXIN) 500 MG tablet, Take 1,000 mg by mouth 4 (four) times daily., Disp: , Rfl:    metoprolol tartrate (LOPRESSOR) 25 MG tablet, Take 25 mg by mouth 2 (two) times daily., Disp: , Rfl:    Multiple Vitamins-Minerals (MULTIVITAMIN ADULTS 50+ PO), Take 1 tablet by mouth daily., Disp: , Rfl:    terbinafine (LAMISIL) 250 MG tablet, Take 250 mg by mouth daily. (Patient not taking: Reported on 12/29/2021), Disp: , Rfl:   Review of Systems:  Constitutional: Denies fever, chills, diaphoresis, appetite change and fatigue.  HEENT: Denies photophobia, eye pain, redness, hearing loss, ear pain, congestion, sore throat, rhinorrhea, sneezing, mouth sores, trouble swallowing, neck pain, neck stiffness and tinnitus.   Respiratory: Denies SOB, DOE, cough, chest tightness,  and wheezing.   Cardiovascular: Denies chest pain, palpitations and leg swelling.  Gastrointestinal: Denies nausea,  vomiting, abdominal pain, diarrhea, constipation, blood in stool and abdominal distention.  Genitourinary: Denies dysuria, urgency, frequency, hematuria, flank pain and difficulty urinating.  Endocrine: Denies: hot or cold intolerance, sweats, changes in hair or nails, polyuria, polydipsia.  Skin: Denies pallor, rash and wound.  Neurological: Denies dizziness, seizures, syncope, weakness, light-headedness, numbness and headaches.  Hematological: Denies adenopathy. Easy bruising, personal or family bleeding history  Psychiatric/Behavioral: Denies suicidal ideation, mood changes, confusion, nervousness, sleep disturbance and  agitation    Physical Exam: Vitals:   12/29/21 1515  BP: (!) 130/58  Pulse: (!) 55  Temp: 98.3 F (36.8 C)  TempSrc: Oral  SpO2: 96%  Weight: 166 lb (75.3 kg)    Body mass index is 31.37 kg/m.   Constitutional: NAD, calm, comfortable Eyes: PERRL, lids and conjunctivae normal, wears corrective lenses ENMT: Mucous membranes are moist.  Musculoskeletal: Pain in right anterior groin with deep squats and with frog leg position. Skin: no rashes, lesions, ulcers. No induration    Impression and Plan:  Hip flexor tendinitis, right - Plan: DG Hip Unilat W OR W/O Pelvis 1V Right  -Suspect this to be hip flexor tendinitis.  I will check a frog-leg hip x-ray to rule out a labral tear.  Have given some exercises.  If no improvement can consider sports medicine/orthopedic referral.  Time spent:31 minutes reviewing chart, interviewing and examining patient and formulating plan of care.       Chaya Jan, MD [facility] at [facility]

## 2022-01-08 DIAGNOSIS — R0989 Other specified symptoms and signs involving the circulatory and respiratory systems: Secondary | ICD-10-CM | POA: Diagnosis not present

## 2022-01-08 DIAGNOSIS — I10 Essential (primary) hypertension: Secondary | ICD-10-CM | POA: Diagnosis not present

## 2022-01-08 DIAGNOSIS — I2510 Atherosclerotic heart disease of native coronary artery without angina pectoris: Secondary | ICD-10-CM | POA: Diagnosis not present

## 2022-01-08 DIAGNOSIS — E785 Hyperlipidemia, unspecified: Secondary | ICD-10-CM | POA: Diagnosis not present

## 2022-01-08 DIAGNOSIS — Z6830 Body mass index (BMI) 30.0-30.9, adult: Secondary | ICD-10-CM | POA: Diagnosis not present

## 2022-01-08 DIAGNOSIS — N183 Chronic kidney disease, stage 3 unspecified: Secondary | ICD-10-CM | POA: Diagnosis not present

## 2022-01-13 ENCOUNTER — Other Ambulatory Visit: Payer: Self-pay | Admitting: *Deleted

## 2022-01-13 MED ORDER — METOPROLOL TARTRATE 25 MG PO TABS: 25.0000 mg | ORAL_TABLET | Freq: Two times a day (BID) | ORAL | 1 refills | Status: DC

## 2022-02-12 ENCOUNTER — Ambulatory Visit: Payer: Medicare HMO | Admitting: Pulmonary Disease

## 2022-02-24 DIAGNOSIS — I2510 Atherosclerotic heart disease of native coronary artery without angina pectoris: Secondary | ICD-10-CM | POA: Diagnosis not present

## 2022-02-24 DIAGNOSIS — I10 Essential (primary) hypertension: Secondary | ICD-10-CM | POA: Diagnosis not present

## 2022-02-24 DIAGNOSIS — I6523 Occlusion and stenosis of bilateral carotid arteries: Secondary | ICD-10-CM | POA: Diagnosis not present

## 2022-02-24 DIAGNOSIS — E785 Hyperlipidemia, unspecified: Secondary | ICD-10-CM | POA: Diagnosis not present

## 2022-02-24 DIAGNOSIS — N189 Chronic kidney disease, unspecified: Secondary | ICD-10-CM | POA: Diagnosis not present

## 2022-03-07 ENCOUNTER — Other Ambulatory Visit: Payer: Self-pay | Admitting: Internal Medicine

## 2022-03-11 ENCOUNTER — Ambulatory Visit: Payer: Medicare HMO | Admitting: Pulmonary Disease

## 2022-03-12 DIAGNOSIS — I6529 Occlusion and stenosis of unspecified carotid artery: Secondary | ICD-10-CM | POA: Diagnosis not present

## 2022-03-18 ENCOUNTER — Encounter: Payer: Self-pay | Admitting: Pulmonary Disease

## 2022-03-18 ENCOUNTER — Ambulatory Visit: Payer: Medicare HMO | Admitting: Pulmonary Disease

## 2022-03-18 VITALS — BP 124/66 | HR 66 | Temp 98.4°F | Wt 161.2 lb

## 2022-03-18 DIAGNOSIS — J4540 Moderate persistent asthma, uncomplicated: Secondary | ICD-10-CM

## 2022-03-18 MED ORDER — BUDESONIDE-FORMOTEROL FUMARATE 160-4.5 MCG/ACT IN AERO: 2.0000 | INHALATION_SPRAY | Freq: Two times a day (BID) | RESPIRATORY_TRACT | 12 refills | Status: DC

## 2022-03-18 NOTE — Patient Instructions (Addendum)
Nice to see you again  Continue Symbicort 2 puffs twice a day every day, rinse your mouth out with water after every use  Return to clinic in 6 months or sooner as needed with Dr. Silas Flood

## 2022-03-19 NOTE — Progress Notes (Signed)
$'@Patient's$  ID: Tasha Garrett, female    DOB: [birth year], 72 y.o.   MRN: [id number]  Chief Complaint  Patient presents with   Follow-up    Follow up for [place]. Pt state that Symbicort is working well for her and no issues noted     Referring provider: Isaac Bliss, [place]*  HPI:   72 y.o. woman whom we are seeing in consultation for evaluation of dyspnea on exertion.  Most recent PCP note x 2 reviewed.  At last visit, patient placed on inhalers given dyspnea on exertion.  Noted emphysema and lower lobe bronchiectasis.  On Symbicort.  She reports good improvement in symptoms.  Not much of an issue now.  HPI at initial visit: Patient with dyspnea for the last several months to couple years.  Worse on inclines or stairs.  No time of day when things are better or worse.  No seasonal environmental factors she can identify that make things better or worse.  No position that makes things better or worse.  She is used albuterol as needed.  She is not sure this helps it much at all.  No other alleviating or exacerbating factors.  Most recent chest imaging CT lung cancer 06/2021 personally reviewed and interpreted as mild emphysematous changes at the apices, mild lower lobe bronchiectasis, otherwise clear.  PMH: Hypertension, hyperlipidemia Surgical history: Hysterectomy, aortic valve replacement/CABG Family history: Mother with ALS Social history: Former smoker, 10-pack-year, quit 2012, lives in [place], works in [employer] at [employer] / Pulmonary Flowsheets:   ACT:      No data to display           MMRC:     No data to display           [place]worth:      No data to display           Tests:   FENO:  No results found for: "NITRICOXIDE"  PFT:     No data to display           WALK:      No data to display           Imaging: Personally reviewed and as per EMR discussion in this note No results  found.  Lab Results: Personally reviewed, notably eosinophils 400 07/2021 CBC    Component Value Date/Time   WBC 8.5 08/04/2021 1556   RBC 4.03 08/04/2021 1556   HGB 11.6 (L) 08/04/2021 1556   HCT 36.5 08/04/2021 1556   PLT 308.0 08/04/2021 1556   MCV 90.5 08/04/2021 1556   MCHC 31.7 08/04/2021 1556   RDW 13.1 08/04/2021 1556   LYMPHSABS 2.3 08/04/2021 1556   MONOABS 0.8 08/04/2021 1556   EOSABS 0.4 08/04/2021 1556   BASOSABS 0.1 08/04/2021 1556    BMET    Component Value Date/Time   NA 140 08/04/2021 1556   NA 143 05/27/2021 0000   K 4.2 08/04/2021 1556   CL 105 08/04/2021 1556   CO2 26 08/04/2021 1556   GLUCOSE 146 (H) 08/04/2021 1556   BUN 23 08/04/2021 1556   CREATININE 1.20 08/04/2021 1556   CALCIUM 10.4 08/04/2021 1556    BNP No results found for: "BNP"  ProBNP No results found for: "PROBNP"  Specialty Problems   None   No Known Allergies  Immunization History  Administered Date(s) Administered   Fluad Quad(high Dose 65+) 11/11/2021   Influenza, High Dose Seasonal PF 11/04/2015, 11/17/2016, 11/18/2017, 11/24/2018, 10/03/2019,  09/30/2020   Influenza-Unspecified 10/26/2020   Moderna Sars-Covid-2 Vaccination 03/13/2019, 04/08/2019, 03/01/2020   Pneumococcal Conjugate-13 12/14/2017   Pneumococcal Polysaccharide-23 05/25/2016   Td (Adult),5 Lf Tetanus Toxid, Preservative Free 10/22/2017   Tdap 10/22/2017    Past Medical History:  Diagnosis Date   Blood in stool    Chronic kidney disease    High cholesterol    Hypertension    Hypothyroid    Urinary tract infection     Tobacco History: Social History   Tobacco Use  Smoking Status Former   Packs/day: 0.50   Years: 20.00   Total pack years: 10.00   Types: Cigarettes   Quit date: 04/2010   Years since quitting: 11.9  Smokeless Tobacco Never  Tobacco Comments   Pt states she stopped smoking 10 years ago and at most smoked 1/2 ppd. ALS 10/12   Counseling given: Not Answered Tobacco  comments: Pt states she stopped smoking 10 years ago and at most smoked 1/2 ppd. ALS 10/12   Continue to not smoke  Outpatient Encounter Medications as of 03/18/2022  Medication Sig   amLODipine (NORVASC) 10 MG tablet TAKE 1 TABLET EVERY DAY   ASPIRIN 81 PO Take 1 tablet by mouth daily.   atorvastatin (LIPITOR) 40 MG tablet TAKE 1 TABLET EVERY DAY   Cod Liver Oil CAPS Take by mouth.   diclofenac Sodium (VOLTAREN) 1 % GEL Apply 2 g topically 4 (four) times daily.   isosorbide mononitrate (IMDUR) 30 MG 24 hr tablet Take 30 mg by mouth daily.   latanoprost (XALATAN) 0.005 % ophthalmic solution 1 drop at bedtime.   levothyroxine (SYNTHROID) 137 MCG tablet Take 137 mcg by mouth daily.   losartan (COZAAR) 100 MG tablet Take 100 mg by mouth daily.   methocarbamol (ROBAXIN) 500 MG tablet Take 1,000 mg by mouth 4 (four) times daily.   metoprolol tartrate (LOPRESSOR) 25 MG tablet Take 1 tablet (25 mg total) by mouth 2 (two) times daily.   Multiple Vitamins-Minerals (MULTIVITAMIN ADULTS 50+ PO) Take 1 tablet by mouth daily.   terbinafine (LAMISIL) 250 MG tablet Take 250 mg by mouth daily.   [DISCONTINUED] budesonide-formoterol (SYMBICORT) 160-4.5 MCG/ACT inhaler Inhale 2 puffs into the lungs in the morning and at bedtime.   budesonide-formoterol (SYMBICORT) 160-4.5 MCG/ACT inhaler Inhale 2 puffs into the lungs in the morning and at bedtime.   No facility-administered encounter medications on file as of 03/18/2022.     Review of Systems  Review of Systems  N/a Physical Exam  BP 124/66 (BP Location: Left Arm, Patient Position: Sitting, Cuff Size: Normal)   Pulse 66   Temp 98.4 F (36.9 C) (Oral)   Wt 161 lb 3.2 oz (73.1 kg)   SpO2 96%   BMI 30.46 kg/m   Wt Readings from Last 5 Encounters:  03/18/22 161 lb 3.2 oz (73.1 kg)  12/29/21 166 lb (75.3 kg)  11/11/21 166 lb 3.2 oz (75.4 kg)  10/23/21 164 lb 9.6 oz (74.7 kg)  09/01/21 165 lb 6.4 oz (75 kg)    BMI Readings from Last 5  Encounters:  03/18/22 30.46 kg/m  12/29/21 31.37 kg/m  11/11/21 31.40 kg/m  10/23/21 31.10 kg/m  09/01/21 32.30 kg/m     Physical Exam General: Sitting in chair, no acute distress Eyes: EOMI, no icterus Neck: Supple, no JVP Pulmonary: Clear, normal work of breathing Cardiovascular: Warm, no edema Abdomen: Nondistended, bowel sounds present MSK: No synovitis, no joint effusion Neuro: Normal gait, no weakness Psych: Normal mood, full affect  Assessment & Plan:   Dyspnea on exertion, wheeze, cough: Asthma versus smoking-related lung disease.  Emphysema albeit mild on prior CT scan.  No PFTs in the past.  Marked improvement on high-dose Symbicort 2 puff twice daily.  Continue albuterol as needed.    Cough: Likely multifactorial.  Related to smoking-related disease or asthma as above.  She also cleared describes aspiration symptoms when she swallows.  She declined speech pathologist evaluation.  She has bilateral lower lobe bronchiectasis likely in the setting of chronic aspiration.  Improved with inhaler therapy.   Return in about 6 months (around 09/18/2022).   Lanier Clam, MD 03/19/2022

## 2022-03-20 DIAGNOSIS — H35363 Drusen (degenerative) of macula, bilateral: Secondary | ICD-10-CM | POA: Diagnosis not present

## 2022-03-20 DIAGNOSIS — H524 Presbyopia: Secondary | ICD-10-CM | POA: Diagnosis not present

## 2022-03-20 DIAGNOSIS — H401131 Primary open-angle glaucoma, bilateral, mild stage: Secondary | ICD-10-CM | POA: Diagnosis not present

## 2022-03-24 DIAGNOSIS — I10 Essential (primary) hypertension: Secondary | ICD-10-CM | POA: Diagnosis not present

## 2022-03-24 DIAGNOSIS — I2510 Atherosclerotic heart disease of native coronary artery without angina pectoris: Secondary | ICD-10-CM | POA: Diagnosis not present

## 2022-03-24 DIAGNOSIS — N189 Chronic kidney disease, unspecified: Secondary | ICD-10-CM | POA: Diagnosis not present

## 2022-04-06 ENCOUNTER — Other Ambulatory Visit: Payer: Self-pay | Admitting: Internal Medicine

## 2022-04-06 DIAGNOSIS — I10 Essential (primary) hypertension: Secondary | ICD-10-CM

## 2022-04-15 ENCOUNTER — Other Ambulatory Visit: Payer: Self-pay | Admitting: Internal Medicine

## 2022-04-15 DIAGNOSIS — E785 Hyperlipidemia, unspecified: Secondary | ICD-10-CM

## 2022-05-16 ENCOUNTER — Other Ambulatory Visit: Payer: Self-pay | Admitting: Internal Medicine

## 2022-05-20 ENCOUNTER — Ambulatory Visit (INDEPENDENT_AMBULATORY_CARE_PROVIDER_SITE_OTHER): Payer: Medicare HMO | Admitting: Internal Medicine

## 2022-05-20 ENCOUNTER — Encounter: Payer: Self-pay | Admitting: Internal Medicine

## 2022-05-20 VITALS — BP 130/70 | HR 57 | Temp 97.8°F | Ht 61.5 in | Wt 160.4 lb

## 2022-05-20 DIAGNOSIS — E039 Hypothyroidism, unspecified: Secondary | ICD-10-CM

## 2022-05-20 DIAGNOSIS — E785 Hyperlipidemia, unspecified: Secondary | ICD-10-CM

## 2022-05-20 DIAGNOSIS — R4589 Other symptoms and signs involving emotional state: Secondary | ICD-10-CM | POA: Diagnosis not present

## 2022-05-20 DIAGNOSIS — E1159 Type 2 diabetes mellitus with other circulatory complications: Secondary | ICD-10-CM

## 2022-05-20 DIAGNOSIS — Z0000 Encounter for general adult medical examination without abnormal findings: Secondary | ICD-10-CM

## 2022-05-20 DIAGNOSIS — N183 Chronic kidney disease, stage 3 unspecified: Secondary | ICD-10-CM

## 2022-05-20 DIAGNOSIS — I10 Essential (primary) hypertension: Secondary | ICD-10-CM | POA: Diagnosis not present

## 2022-05-20 DIAGNOSIS — Z1159 Encounter for screening for other viral diseases: Secondary | ICD-10-CM

## 2022-05-20 DIAGNOSIS — M159 Polyosteoarthritis, unspecified: Secondary | ICD-10-CM | POA: Diagnosis not present

## 2022-05-20 DIAGNOSIS — Z13820 Encounter for screening for osteoporosis: Secondary | ICD-10-CM | POA: Diagnosis not present

## 2022-05-20 LAB — LIPID PANEL
Cholesterol: 135 mg/dL (ref 0–200)
HDL: 29.9 mg/dL — ABNORMAL LOW (ref >39.00)
NonHDL: 105.31
Total CHOL/HDL Ratio: 5
Triglycerides: 222 mg/dL — ABNORMAL HIGH (ref 0.0–149.0)
VLDL: 44.4 mg/dL — ABNORMAL HIGH (ref 0.0–40.0)

## 2022-05-20 LAB — CBC WITH DIFFERENTIAL/PLATELET
Basophils Absolute: 0.1 10*3/uL (ref 0.0–0.1)
Basophils Relative: 1.7 % (ref 0.0–3.0)
Eosinophils Absolute: 0.5 10*3/uL (ref 0.0–0.7)
Eosinophils Relative: 7 % — ABNORMAL HIGH (ref 0.0–5.0)
HCT: 34.5 % — ABNORMAL LOW (ref 36.0–46.0)
Hemoglobin: 11.4 g/dL — ABNORMAL LOW (ref 12.0–15.0)
Lymphocytes Relative: 26.4 % (ref 12.0–46.0)
Lymphs Abs: 1.8 10*3/uL (ref 0.7–4.0)
MCHC: 33 g/dL (ref 30.0–36.0)
MCV: 88.4 fl (ref 78.0–100.0)
Monocytes Absolute: 0.7 10*3/uL (ref 0.1–1.0)
Monocytes Relative: 9.5 % (ref 3.0–12.0)
Neutro Abs: 3.8 10*3/uL (ref 1.4–7.7)
Neutrophils Relative %: 55.4 % (ref 43.0–77.0)
Platelets: 347 10*3/uL (ref 150.0–400.0)
RBC: 3.9 Mil/uL (ref 3.87–5.11)
RDW: 13.2 % (ref 11.5–15.5)
WBC: 6.9 10*3/uL (ref 4.0–10.5)

## 2022-05-20 LAB — MICROALBUMIN / CREATININE URINE RATIO
Creatinine,U: 157.2 mg/dL
Microalb Creat Ratio: 10 mg/g (ref 0.0–30.0)
Microalb, Ur: 15.7 mg/dL — ABNORMAL HIGH (ref 0.0–1.9)

## 2022-05-20 LAB — COMPREHENSIVE METABOLIC PANEL
ALT: 13 U/L (ref 0–35)
AST: 16 U/L (ref 0–37)
Albumin: 4.1 g/dL (ref 3.5–5.2)
Alkaline Phosphatase: 117 U/L (ref 39–117)
BUN: 26 mg/dL — ABNORMAL HIGH (ref 6–23)
CO2: 26 mEq/L (ref 19–32)
Calcium: 10.8 mg/dL — ABNORMAL HIGH (ref 8.4–10.5)
Chloride: 106 mEq/L (ref 96–112)
Creatinine, Ser: 1.29 mg/dL — ABNORMAL HIGH (ref 0.40–1.20)
GFR: 41.6 mL/min — ABNORMAL LOW (ref >60.00)
Glucose, Bld: 143 mg/dL — ABNORMAL HIGH (ref 70–99)
Potassium: 5.1 mEq/L (ref 3.5–5.1)
Sodium: 141 mEq/L (ref 135–145)
Total Bilirubin: 0.6 mg/dL (ref 0.2–1.2)
Total Protein: 7.5 g/dL (ref 6.0–8.3)

## 2022-05-20 LAB — TSH: TSH: 1.66 u[IU]/mL (ref 0.35–5.50)

## 2022-05-20 LAB — LDL CHOLESTEROL, DIRECT: Direct LDL: 69 mg/dL

## 2022-05-20 LAB — VITAMIN B12: Vitamin B-12: 783 pg/mL (ref 211–911)

## 2022-05-20 LAB — VITAMIN D 25 HYDROXY (VIT D DEFICIENCY, FRACTURES): VITD: 27.88 ng/mL — ABNORMAL LOW (ref 30.00–100.00)

## 2022-05-20 LAB — HEMOGLOBIN A1C: Hgb A1c MFr Bld: 7 % — ABNORMAL HIGH (ref 4.6–6.5)

## 2022-05-20 MED ORDER — DICLOFENAC SODIUM 1 % EX GEL: 2.0000 g | Freq: Four times a day (QID) | CUTANEOUS | 1 refills | Status: DC

## 2022-05-20 NOTE — Progress Notes (Signed)
Established Patient Office Visit     CC/Reason for Visit: Annual preventive exam and subsequent Medicare wellness visit  HPI: Tasha Garrett is a 72 y.o. female who is coming in today for the above mentioned reasons. Past Medical History is significant for: Hypertension, hyperlipidemia, chronic kidney disease stage III, coronary artery disease and hypothyroidism.  She feels well today and has no acute concerns or complaints.  She has routine eye and dental care, no perceived hearing difficulty.  She is due for RSV vaccine, states she has had shingles vaccines although I do not have records of this.  She had a mammogram in November 2023, she had a normal Cologuard in November 2023.  She prefers to defer cervical cancer screening due to age, DEXA scan is overdue.   Past Medical/Surgical History: Past Medical History:  Diagnosis Date   Blood in stool    Chronic kidney disease    High cholesterol    Hypertension    Hypothyroid    Urinary tract infection     Past Surgical History:  Procedure Laterality Date   ABDOMINAL HYSTERECTOMY     AORTIC VALVE REPLACEMENT (AVR)/CORONARY ARTERY BYPASS GRAFTING (CABG)      Social History:  reports that she quit smoking about 12 years ago. Her smoking use included cigarettes. She has a 10.00 pack-year smoking history. She has never used smokeless tobacco. She reports current alcohol use. She reports that she does not currently use drugs.  Allergies: No Known Allergies  Family History:  Family History  Problem Relation Age of Onset   ALS Mother      Current Outpatient Medications:    amLODipine (NORVASC) 10 MG tablet, TAKE 1 TABLET EVERY DAY (NEED MD APPOINTMENT), Disp: 90 tablet, Rfl: 1   ASPIRIN 81 PO, Take 1 tablet by mouth daily., Disp: , Rfl:    atorvastatin (LIPITOR) 40 MG tablet, TAKE 1 TABLET EVERY DAY, Disp: 90 tablet, Rfl: 0   brimonidine (ALPHAGAN) 0.2 % ophthalmic solution, 3 (three) times daily., Disp: , Rfl:     budesonide-formoterol (SYMBICORT) 160-4.5 MCG/ACT inhaler, Inhale 2 puffs into the lungs in the morning and at bedtime., Disp: 1 each, Rfl: 12   Cod Liver Oil CAPS, Take by mouth., Disp: , Rfl:    isosorbide mononitrate (IMDUR) 30 MG 24 hr tablet, Take 30 mg by mouth daily., Disp: , Rfl:    latanoprost (XALATAN) 0.005 % ophthalmic solution, 1 drop at bedtime., Disp: , Rfl:    levothyroxine (SYNTHROID) 137 MCG tablet, Take 137 mcg by mouth daily., Disp: , Rfl:    losartan (COZAAR) 100 MG tablet, Take 100 mg by mouth daily., Disp: , Rfl:    metoprolol tartrate (LOPRESSOR) 25 MG tablet, Take 1 tablet (25 mg total) by mouth 2 (two) times daily., Disp: 180 tablet, Rfl: 1   Multiple Vitamins-Minerals (MULTIVITAMIN ADULTS 50+ PO), Take 1 tablet by mouth daily., Disp: , Rfl:    diclofenac Sodium (VOLTAREN) 1 % GEL, Apply 2 g topically 4 (four) times daily., Disp: 350 g, Rfl: 1  Review of Systems:  Negative unless indicated in HPI.   Physical Exam: Vitals:   05/20/22 1046  BP: 130/70  Pulse: (!) 57  Temp: 97.8 F (36.6 C)  TempSrc: Oral  SpO2: 97%  Weight: 160 lb 6.4 oz (72.8 kg)  Height: 5' 1.5" (1.562 m)    Body mass index is 29.82 kg/m.   Physical Exam Vitals reviewed.  Constitutional:      General: She is not  in acute distress.    Appearance: Normal appearance. She is not ill-appearing, toxic-appearing or diaphoretic.  HENT:     Head: Normocephalic.     Right Ear: Tympanic membrane, ear canal and external ear normal. There is no impacted cerumen.     Left Ear: Tympanic membrane, ear canal and external ear normal. There is no impacted cerumen.     Nose: Nose normal.     Mouth/Throat:     Mouth: Mucous membranes are moist.     Pharynx: Oropharynx is clear. No oropharyngeal exudate or posterior oropharyngeal erythema.  Eyes:     General: No scleral icterus.       Right eye: No discharge.        Left eye: No discharge.     Conjunctiva/sclera: Conjunctivae normal.     Pupils:  Pupils are equal, round, and reactive to light.  Neck:     Vascular: No carotid bruit.  Cardiovascular:     Rate and Rhythm: Normal rate and regular rhythm.     Pulses: Normal pulses.     Heart sounds: Normal heart sounds.  Pulmonary:     Effort: Pulmonary effort is normal. No respiratory distress.     Breath sounds: Normal breath sounds.  Abdominal:     General: Abdomen is flat. Bowel sounds are normal.     Palpations: Abdomen is soft.  Musculoskeletal:        General: Normal range of motion.     Cervical back: Normal range of motion.  Skin:    General: Skin is warm and dry.  Neurological:     General: No focal deficit present.     Mental Status: She is alert and oriented to person, place, and time. Mental status is at baseline.  Psychiatric:        Mood and Affect: Mood normal.        Behavior: Behavior normal.        Thought Content: Thought content normal.        Judgment: Judgment normal.    Subsequent Medicare wellness visit   1. Risk factors, based on past  M,S,F - Cardiac Risk Factors include: advanced age (>30men, >58 women);diabetes mellitus;dyslipidemia   2.  Physical activities: Dietary issues and exercise activities discussed:  Current Exercise Habits: Home exercise routine, Type of exercise: walking, Time (Minutes): 20, Frequency (Times/Week): 3, Weekly Exercise (Minutes/Week): 60, Intensity: Moderate   3.  Depression/mood:  Flowsheet Row Office Visit from 05/20/2022 in [facility] Total Score 2        4.  ADL's:    05/19/2022    4:01 PM  In your present state of health, do you have any difficulty performing the following activities:  Hearing? 1  Comment sometimes  Vision? 0  Difficulty concentrating or making decisions? 0  Walking or climbing stairs? 1  Comment sometimes  Dressing or bathing? 0  Doing errands, shopping? 0  Preparing Food and eating ? N  Using the Toilet? N  In the past six months, have you  accidently leaked urine? N  Do you have problems with loss of bowel control? N  Managing your Medications? N  Managing your Finances? N  Housekeeping or managing your Housekeeping? N     5.  Fall risk:     08/04/2021    3:38 PM 09/01/2021    3:10 PM 11/11/2021    3:55 PM 05/19/2022    4:04 PM 05/20/2022   10:52 AM  Fall Risk  Falls in the past year? 1 0 0 0 0  Was there an injury with Fall? 1 0 0 0 0  Fall Risk Category Calculator 3 0 0 0 0  Fall Risk Category (Retired) High Low Low    (RETIRED) Patient Fall Risk Level High fall risk Low fall risk Low fall risk    Patient at Risk for Falls Due to Impaired balance/gait No Fall Risks No Fall Risks    Fall risk Follow up Falls evaluation completed Falls evaluation completed Falls evaluation completed Falls evaluation completed Falls evaluation completed     6.  Home safety: No problems identified   7.  Height weight, and visual acuity: height and weight as above, vision/hearing: Vision Screening   Right eye Left eye Both eyes  Without correction     With correction 20/25 20/25 20/25      8.  Counseling: Counseling given: Not Answered Tobacco comments: Pt states she stopped smoking 10 years ago and at most smoked 1/2 ppd. ALS 10/12    9. Lab orders based on risk factors: Laboratory update will be reviewed   10. Cognitive assessment:        05/19/2022    4:05 PM  6CIT Screen  What Year? 0 points  What month? 0 points  What time? 0 points  Count back from 20 0 points  Months in reverse 0 points  Repeat phrase 0 points  Total Score 0 points     11. Screening: Patient provided with a written and personalized 5-10 year screening schedule in the AVS. Health Maintenance  Topic Date Due   Complete foot exam   Never done   Hepatitis C Screening: USPSTF Recommendation to screen - Ages 49-79 yo.  Never done   Zoster (Shingles) Vaccine (1 of 2) Never done   DEXA scan (bone density measurement)  Never done   COVID-19 Vaccine (4  - 2023-24 season) 09/12/2021   Yearly kidney health urinalysis for diabetes  05/09/2022   Hemoglobin A1C  05/12/2022   Colon Cancer Screening  11/12/2022*   Eye exam for diabetics  11/19/2022*   Yearly kidney function blood test for diabetes  08/05/2022   Flu Shot  08/13/2022   Medicare Annual Wellness Visit  05/20/2023   Mammogram  11/23/2023   DTaP/Tdap/Td vaccine (3 - Td or Tdap) 10/23/2027   Pneumonia Vaccine  Completed   HPV Vaccine  Aged Out  *Topic was postponed. The date shown is not the original due date.    12. Provider List Update: Patient Care Team    Relationship Specialty Notifications Start End  Philip Aspen, Limmie Patricia, MD PCP - General Internal Medicine  02/04/21      13. Advance Directives: Does Patient Have a Medical Advance Directive?: No Would patient like information on creating a medical advance directive?: No - Patient declined  14. Opioids: Patient is not on any opioid prescriptions and has no risk factors for a substance use disorder.   15.   Goals      Protect My Health     Timeframe:  Long-Range Goal Priority:  Medium Start Date:                             Expected End Date:                       Follow Up Date 05/19/2023    - schedule and  keep appointment for annual check-up    Why is this important?   Screening tests can find diseases early when they are easier to treat.  Your doctor or nurse will talk with you about which tests are important for you.  Getting shots for common diseases like the flu and shingles will help prevent them.     Notes:          I have personally reviewed and noted the following in the patient's chart:   Medical and social history Use of alcohol, tobacco or illicit drugs  Current medications and supplements Functional ability and status Nutritional status Physical activity Advanced directives List of other physicians Hospitalizations, surgeries, and ER visits in previous 12  months Vitals Screenings to include cognitive, depression, and falls Referrals and appointments  In addition, I have reviewed and discussed with patient certain preventive protocols, quality metrics, and best practice recommendations. A written personalized care plan for preventive services as well as general preventive health recommendations were provided to patient.   Impression and Plan:  Type 2 diabetes mellitus with other circulatory complication, without long-term current use of insulin (HCC) -     Microalbumin / creatinine urine ratio -     CBC with Differential/Platelet; Future -     Comprehensive metabolic panel; Future -     Hemoglobin A1c  Encounter for screening for osteoporosis -     DG Bone Density; Future  Hyperlipidemia with target LDL less than 70 -     Lipid panel; Future  Essential hypertension  Stage 3 chronic kidney disease, unspecified whether stage 3a or 3b CKD (HCC)  Hypothyroidism, unspecified type -     TSH; Future  Medicare annual wellness visit, subsequent  Encounter for hepatitis C screening test for low risk patient -     Hepatitis C antibody; Future  Osteoarthritis of multiple joints, unspecified osteoarthritis type -     Diclofenac Sodium; Apply 2 g topically 4 (four) times daily.  Dispense: 350 g; Refill: 1  Depressed mood -     Vitamin B12; Future -     VITAMIN D 25 Hydroxy (Vit-D Deficiency, Fractures); Future -     Ambulatory referral to Psychiatry   -Recommend routine eye and dental care. -Healthy lifestyle discussed in detail. -Labs to be updated today. -Prostate cancer screening: N/A Health Maintenance  Topic Date Due   Complete foot exam   Never done   Hepatitis C Screening: USPSTF Recommendation to screen - Ages 38-79 yo.  Never done   Zoster (Shingles) Vaccine (1 of 2) Never done   DEXA scan (bone density measurement)  Never done   COVID-19 Vaccine (4 - 2023-24 season) 09/12/2021   Yearly kidney health urinalysis for  diabetes  05/09/2022   Hemoglobin A1C  05/12/2022   Colon Cancer Screening  11/12/2022*   Eye exam for diabetics  11/19/2022*   Yearly kidney function blood test for diabetes  08/05/2022   Flu Shot  08/13/2022   Medicare Annual Wellness Visit  05/20/2023   Mammogram  11/23/2023   DTaP/Tdap/Td vaccine (3 - Td or Tdap) 10/23/2027   Pneumonia Vaccine  Completed   HPV Vaccine  Aged Out  *Topic was postponed. The date shown is not the original due date.   -Obtain records from shingles immunization, check hep C antibody for screening purposes. -DEXA ordered.      Chaya Jan, MD [facility]

## 2022-05-21 LAB — HEPATITIS C ANTIBODY: Hepatitis C Ab: NONREACTIVE

## 2022-05-28 ENCOUNTER — Telehealth: Payer: Self-pay | Admitting: *Deleted

## 2022-05-28 ENCOUNTER — Encounter: Payer: Self-pay | Admitting: Internal Medicine

## 2022-05-28 LAB — HM DIABETES EYE EXAM

## 2022-05-28 NOTE — Telephone Encounter (Signed)
Patient would like to know the results of her resent labs.

## 2022-06-01 ENCOUNTER — Encounter: Payer: Self-pay | Admitting: Internal Medicine

## 2022-06-01 ENCOUNTER — Other Ambulatory Visit: Payer: Self-pay | Admitting: Internal Medicine

## 2022-06-01 DIAGNOSIS — E559 Vitamin D deficiency, unspecified: Secondary | ICD-10-CM | POA: Insufficient documentation

## 2022-06-01 MED ORDER — VITAMIN D (ERGOCALCIFEROL) 1.25 MG (50000 UNIT) PO CAPS
50000.0000 [IU] | ORAL_CAPSULE | ORAL | 0 refills | Status: AC
Start: 2022-06-01 — End: 2022-08-18

## 2022-06-01 NOTE — Telephone Encounter (Signed)
Spoke to patient, reviewed lab results, follow up appointment scheduled.

## 2022-06-01 NOTE — Telephone Encounter (Signed)
Left message on machine for patient to return our call 

## 2022-06-04 DIAGNOSIS — F4320 Adjustment disorder, unspecified: Secondary | ICD-10-CM | POA: Diagnosis not present

## 2022-06-09 DIAGNOSIS — F4320 Adjustment disorder, unspecified: Secondary | ICD-10-CM | POA: Diagnosis not present

## 2022-06-11 ENCOUNTER — Other Ambulatory Visit: Payer: Self-pay | Admitting: Pulmonary Disease

## 2022-06-23 DIAGNOSIS — F4320 Adjustment disorder, unspecified: Secondary | ICD-10-CM | POA: Diagnosis not present

## 2022-06-24 ENCOUNTER — Other Ambulatory Visit: Payer: Self-pay | Admitting: Internal Medicine

## 2022-06-27 ENCOUNTER — Other Ambulatory Visit: Payer: Self-pay | Admitting: Internal Medicine

## 2022-06-27 DIAGNOSIS — E785 Hyperlipidemia, unspecified: Secondary | ICD-10-CM

## 2022-07-03 DIAGNOSIS — Z6830 Body mass index (BMI) 30.0-30.9, adult: Secondary | ICD-10-CM | POA: Diagnosis not present

## 2022-07-03 DIAGNOSIS — I10 Essential (primary) hypertension: Secondary | ICD-10-CM | POA: Diagnosis not present

## 2022-07-03 DIAGNOSIS — I2510 Atherosclerotic heart disease of native coronary artery without angina pectoris: Secondary | ICD-10-CM | POA: Diagnosis not present

## 2022-07-03 DIAGNOSIS — I6529 Occlusion and stenosis of unspecified carotid artery: Secondary | ICD-10-CM | POA: Diagnosis not present

## 2022-07-03 DIAGNOSIS — E785 Hyperlipidemia, unspecified: Secondary | ICD-10-CM | POA: Diagnosis not present

## 2022-07-03 DIAGNOSIS — Z6829 Body mass index (BMI) 29.0-29.9, adult: Secondary | ICD-10-CM | POA: Diagnosis not present

## 2022-07-03 DIAGNOSIS — N183 Chronic kidney disease, stage 3 unspecified: Secondary | ICD-10-CM | POA: Diagnosis not present

## 2022-07-06 ENCOUNTER — Encounter (HOSPITAL_COMMUNITY): Payer: Self-pay

## 2022-07-06 ENCOUNTER — Ambulatory Visit (HOSPITAL_COMMUNITY): Payer: Medicare HMO

## 2022-07-07 ENCOUNTER — Telehealth: Payer: Self-pay | Admitting: Internal Medicine

## 2022-07-07 MED ORDER — AMLODIPINE BESYLATE 10 MG PO TABS: ORAL_TABLET | ORAL | 1 refills | Status: DC

## 2022-07-07 MED ORDER — ISOSORBIDE MONONITRATE ER 30 MG PO TB24: 30.0000 mg | ORAL_TABLET | Freq: Every day | ORAL | 1 refills | Status: DC

## 2022-07-07 MED ORDER — LEVOTHYROXINE SODIUM 137 MCG PO TABS: 137.0000 ug | ORAL_TABLET | Freq: Every day | ORAL | 1 refills | Status: DC

## 2022-07-07 NOTE — Telephone Encounter (Signed)
Patient is out of Isosorbide Mononitrate and needs a refill. Patient also needs Amlodipine and Levothyroxine  Pharmacy- [facility]

## 2022-07-07 NOTE — Telephone Encounter (Signed)
Refills sent

## 2022-07-09 ENCOUNTER — Ambulatory Visit (HOSPITAL_COMMUNITY)
Admission: RE | Admit: 2022-07-09 | Discharge: 2022-07-09 | Disposition: A | Discharge status: Home / Self Care | Payer: Medicare HMO | Source: Ambulatory Visit | Attending: Acute Care | Admitting: Acute Care

## 2022-07-09 DIAGNOSIS — Z87891 Personal history of nicotine dependence: Secondary | ICD-10-CM | POA: Diagnosis not present

## 2022-07-09 DIAGNOSIS — Z122 Encounter for screening for malignant neoplasm of respiratory organs: Secondary | ICD-10-CM | POA: Insufficient documentation

## 2022-07-09 DIAGNOSIS — F17210 Nicotine dependence, cigarettes, uncomplicated: Secondary | ICD-10-CM | POA: Diagnosis not present

## 2022-07-13 ENCOUNTER — Telehealth: Payer: Self-pay | Admitting: Acute Care

## 2022-07-13 DIAGNOSIS — Z122 Encounter for screening for malignant neoplasm of respiratory organs: Secondary | ICD-10-CM

## 2022-07-13 DIAGNOSIS — Z87891 Personal history of nicotine dependence: Secondary | ICD-10-CM

## 2022-07-13 NOTE — Telephone Encounter (Signed)
Called and spoke to patient. Informed her of the results and plan to do a repeat scan in 6 months due to the new nodules seen. Patient verbalized understanding and she is agreeable to repeat scan. Order has been placed. Nothing further needed at this time.

## 2022-07-13 NOTE — Telephone Encounter (Signed)
See other telephone note from 07/13/22

## 2022-07-13 NOTE — Telephone Encounter (Signed)
Call Report CT Scan

## 2022-07-14 ENCOUNTER — Other Ambulatory Visit: Payer: Self-pay | Admitting: *Deleted

## 2022-07-14 DIAGNOSIS — R0989 Other specified symptoms and signs involving the circulatory and respiratory systems: Secondary | ICD-10-CM

## 2022-07-20 ENCOUNTER — Telehealth: Payer: Self-pay | Admitting: *Deleted

## 2022-07-20 NOTE — Telephone Encounter (Signed)
Patient has been out of her  isosorbide mononitrate (IMDUR) 30 MG 24 hr tablet for a few days.   She now has diarrhea and chills.  She would like to know if her medication can cause diarrhea?

## 2022-07-20 NOTE — Telephone Encounter (Signed)
Patient is aware 

## 2022-07-21 DIAGNOSIS — F4320 Adjustment disorder, unspecified: Secondary | ICD-10-CM | POA: Diagnosis not present

## 2022-07-24 DIAGNOSIS — H401131 Primary open-angle glaucoma, bilateral, mild stage: Secondary | ICD-10-CM | POA: Diagnosis not present

## 2022-07-24 DIAGNOSIS — H35363 Drusen (degenerative) of macula, bilateral: Secondary | ICD-10-CM | POA: Diagnosis not present

## 2022-07-28 DIAGNOSIS — M25511 Pain in right shoulder: Secondary | ICD-10-CM | POA: Diagnosis not present

## 2022-07-28 DIAGNOSIS — M62838 Other muscle spasm: Secondary | ICD-10-CM | POA: Diagnosis not present

## 2022-07-29 ENCOUNTER — Ambulatory Visit (INDEPENDENT_AMBULATORY_CARE_PROVIDER_SITE_OTHER): Payer: Medicare HMO | Admitting: Family Medicine

## 2022-07-29 ENCOUNTER — Encounter: Payer: Self-pay | Admitting: Family Medicine

## 2022-07-29 VITALS — BP 144/80 | HR 73 | Temp 98.6°F | Ht 61.5 in | Wt 156.4 lb

## 2022-07-29 DIAGNOSIS — M62838 Other muscle spasm: Secondary | ICD-10-CM | POA: Diagnosis not present

## 2022-07-29 DIAGNOSIS — I10 Essential (primary) hypertension: Secondary | ICD-10-CM

## 2022-07-29 DIAGNOSIS — M542 Cervicalgia: Secondary | ICD-10-CM

## 2022-07-29 DIAGNOSIS — S161XXA Strain of muscle, fascia and tendon at neck level, initial encounter: Secondary | ICD-10-CM

## 2022-07-29 DIAGNOSIS — G44209 Tension-type headache, unspecified, not intractable: Secondary | ICD-10-CM

## 2022-07-29 MED ORDER — PREDNISONE 20 MG PO TABS
20.0000 mg | ORAL_TABLET | Freq: Every day | ORAL | 0 refills | Status: AC
Start: 2022-07-29 — End: 2022-08-01

## 2022-07-29 NOTE — Patient Instructions (Addendum)
A prescription for prednisone was sent to your pharmacy.  You can take this to help decrease the inflammation in your neck along with the other medications such as Tylenol that you have been using.  You can also use heat, massage, stretching.  Continue to monitor your blood pressure as elevated blood pressure can contribute to headaches.  You can use half tab of the Flexeril during the day if needed.

## 2022-07-29 NOTE — Progress Notes (Unsigned)
9  Established Patient Office Visit   Subjective  Patient ID: Tasha Garrett, female    DOB: [birth year]  Age: 72 y.o. MRN: [id number]  Chief Complaint  Patient presents with  . Headache  . Pain    Behind eyes    Patient is a 72 year old female followed by Dr. Ardyth Harps and seen for acute concern.  Patient endorses developing right-sided neck pain 2 days ago.  Patient states she woke up got dressed and neck started hurting.  Patient denies injury.  Typically is a side sleeper.  Sleeping on a 2 relatively new flat pillows.  Patient notes pain in right side of neck radiating into parietal area and behind the eye.  Sensations like a headache but worse, like a bump.  Patient took Tylenol which did not help.  Hurts to turn neck.  Seen in the ED, given muscle relaxer   {History (Optional):23778}  ROS Negative unless stated above    Objective:     BP (!) 144/80 (BP Location: Left Arm, Patient Position: Sitting, Cuff Size: Normal)   Pulse 73   Temp 98.6 F (37 C) (Oral)   Ht 5' 1.5" (1.562 m)   Wt 156 lb 6.4 oz (70.9 kg)   SpO2 98%   BMI 29.07 kg/m  {Vitals History (Optional):23777}  Physical Exam Constitutional:      Appearance: She is well-developed.  HENT:     Head: Normocephalic and atraumatic.     Right Ear: Hearing, tympanic membrane and ear canal normal.     Left Ear: Hearing, tympanic membrane and ear canal normal.  Eyes:     Extraocular Movements: Extraocular movements intact.     Pupils: Pupils are equal, round, and reactive to light.  Neck:     Comments: Decreased ROM of neck .  TTP of right occipital area and right upper shoulder.  No TTP of right temple. Cardiovascular:     Rate and Rhythm: Normal rate and regular rhythm.     Heart sounds: Normal heart sounds.  Pulmonary:     Effort: Pulmonary effort is normal.  Musculoskeletal:        General: Tenderness present.  Skin:    General: Skin is warm and dry.  Neurological:     Mental Status: She is alert and  oriented to person, place, and time. Mental status is at baseline.     No results found for any visits on 07/29/22.    Assessment & Plan:  Strain of cervical portion of right trapezius muscle -     predniSONE; Take 1 tablet (20 mg total) by mouth daily with breakfast for 3 days.  Dispense: 3 tablet; Refill: 0  Neck pain on right side -     predniSONE; Take 1 tablet (20 mg total) by mouth daily with breakfast for 3 days.  Dispense: 3 tablet; Refill: 0  Essential hypertension  Acute non intractable tension-type headache  Muscle spasm  Right trapezius muscle strain causing pain and spasm in right neck and shoulder.  Discussed supportive care including stretching, heat, topical analgesics, Tylenol.  Discussed obtaining a new pillow as it may be contributing to symptoms.  Discussed a low dose steroid burst given patient's history of DM 2.  Advised on likely duration of symptoms.  Advised muscle strain as well as elevated BP likely contributing to headache.  Continue to monitor BP at home.  Continue Norvasc 10 mg daily, losartan 100 mg daily, Lopressor 25 mg twice daily.  For continued elevation in BP consistently greater  than 140/90 not in pain consider increasing Lopressor.  Return if symptoms worsen or fail to improve.   Deeann Saint, MD

## 2022-08-04 DIAGNOSIS — F4320 Adjustment disorder, unspecified: Secondary | ICD-10-CM | POA: Diagnosis not present

## 2022-08-05 ENCOUNTER — Ambulatory Visit (HOSPITAL_COMMUNITY): Payer: Medicare HMO

## 2022-08-10 ENCOUNTER — Encounter: Payer: Medicare HMO | Admitting: Vascular Surgery

## 2022-08-10 ENCOUNTER — Encounter (HOSPITAL_COMMUNITY): Payer: Medicare HMO

## 2022-08-11 ENCOUNTER — Encounter: Payer: Self-pay | Admitting: Vascular Surgery

## 2022-08-11 ENCOUNTER — Ambulatory Visit (HOSPITAL_COMMUNITY)
Admission: RE | Admit: 2022-08-11 | Discharge: 2022-08-11 | Disposition: A | Discharge status: Home / Self Care | Payer: Medicare HMO | Source: Ambulatory Visit | Attending: Vascular Surgery | Admitting: Vascular Surgery

## 2022-08-11 ENCOUNTER — Ambulatory Visit: Payer: Medicare HMO | Admitting: Vascular Surgery

## 2022-08-11 VITALS — BP 156/86 | HR 64 | Temp 97.3°F | Resp 14 | Ht 61.0 in | Wt 157.0 lb

## 2022-08-11 DIAGNOSIS — I6529 Occlusion and stenosis of unspecified carotid artery: Secondary | ICD-10-CM | POA: Insufficient documentation

## 2022-08-11 DIAGNOSIS — I6522 Occlusion and stenosis of left carotid artery: Secondary | ICD-10-CM | POA: Diagnosis not present

## 2022-08-11 DIAGNOSIS — R0989 Other specified symptoms and signs involving the circulatory and respiratory systems: Secondary | ICD-10-CM | POA: Insufficient documentation

## 2022-08-11 NOTE — Progress Notes (Signed)
Patient name: Tasha Garrett MRN: [id number] DOB: 08-03-1950 Sex: female  REASON FOR CONSULT: Carotid stenosis  HPI: Tasha Garrett is a 72 y.o. female, with history of CKD stage 3, CAD s/p CABG, hypertension, hyperlipidemia that presents for evaluation of carotid stenosis.  She had a carotid duplex in [place], [place] showing greater than 70% left ICA stenosis.  Subsequent had a CTA at [facility] that showed a 58% stenosis of the left carotid bulb and 79 to 80% stenosis of the left ICA.  Patient denies any history of stroke or TIA.  No recent neurologic events.  She is on aspirin and statin for risk reduction.  States she quit smoking after her heart attack.  Past Medical History:  Diagnosis Date   Blood in stool    Chronic kidney disease    High cholesterol    Hypertension    Hypothyroid    Urinary tract infection     Past Surgical History:  Procedure Laterality Date   ABDOMINAL HYSTERECTOMY     AORTIC VALVE REPLACEMENT (AVR)/CORONARY ARTERY BYPASS GRAFTING (CABG)      Family History  Problem Relation Age of Onset   ALS Mother     SOCIAL HISTORY: Social History   Socioeconomic History   Marital status: Widowed    Spouse name: Not on file   Number of children: Not on file   Years of education: Not on file   Highest education level: Not on file  Occupational History   Not on file  Tobacco Use   Smoking status: Former    Current packs/day: 0.00    Average packs/day: 0.5 packs/day for 20.0 years (10.0 ttl pk-yrs)    Types: Cigarettes    Start date: 04/1990    Quit date: 04/2010    Years since quitting: 12.3   Smokeless tobacco: Never   Tobacco comments:    Pt states she stopped smoking 10 years ago and at most smoked 1/2 ppd. ALS 10/12  Substance and Sexual Activity   Alcohol use: Yes   Drug use: Not Currently   Sexual activity: Not Currently  Other Topics Concern   Not on file  Social History Narrative   Not on file   Social Determinants of Health   Financial  Resource Strain: Low Risk  (05/19/2022)   Overall Financial Resource Strain (CARDIA)    Difficulty of Paying Living Expenses: Not hard at all  Food Insecurity: No Food Insecurity (05/19/2022)   Hunger Vital Sign    Worried About Running Out of Food in the Last Year: Never true    Ran Out of Food in the Last Year: Never true  Transportation Needs: No Transportation Needs (05/19/2022)   PRAPARE - Administrator, Civil Service (Medical): No    Lack of Transportation (Non-Medical): No  Physical Activity: Insufficiently Active (05/19/2022)   Exercise Vital Sign    Days of Exercise per Week: 3 days    Minutes of Exercise per Session: 20 min  Stress: No Stress Concern Present (05/19/2022)   [employer] of Occupational Health - Occupational Stress Questionnaire    Feeling of Stress : Not at all  Social Connections: Moderately Integrated (05/19/2022)   Social Connection and Isolation Panel [NHANES]    Frequency of Communication with Friends and Family: More than three times a week    Frequency of Social Gatherings with Friends and Family: Once a week    Attends Religious Services: More than 4 times per year    Active Member  of Clubs or Organizations: Yes    Attends Banker Meetings: More than 4 times per year    Marital Status: Widowed  Intimate Partner Violence: Not At Risk (05/19/2022)   Humiliation, Afraid, Rape, and Kick questionnaire    Fear of Current or Ex-Partner: No    Emotionally Abused: No    Physically Abused: No    Sexually Abused: No    No Known Allergies  Current Outpatient Medications  Medication Sig Dispense Refill   amLODipine (NORVASC) 10 MG tablet TAKE 1 TABLET EVERY DAY 90 tablet 1   ASPIRIN 81 PO Take 1 tablet by mouth daily.     atorvastatin (LIPITOR) 40 MG tablet Take 1 tablet (40 mg total) by mouth at bedtime. 90 tablet 1   Cod Liver Oil CAPS Take by mouth.     diclofenac Sodium (VOLTAREN) 1 % GEL Apply 2 g topically 4 (four) times daily. 350  g 1   isosorbide mononitrate (IMDUR) 30 MG 24 hr tablet Take 1 tablet (30 mg total) by mouth daily. 90 tablet 1   latanoprost (XALATAN) 0.005 % ophthalmic solution 1 drop at bedtime.     levothyroxine (SYNTHROID) 137 MCG tablet Take 1 tablet (137 mcg total) by mouth daily. 90 tablet 1   losartan (COZAAR) 100 MG tablet Take 100 mg by mouth daily.     metoprolol tartrate (LOPRESSOR) 25 MG tablet TAKE 1 TABLET TWICE DAILY 180 tablet 1   Multiple Vitamins-Minerals (MULTIVITAMIN ADULTS 50+ PO) Take 1 tablet by mouth daily.     SYMBICORT 160-4.5 MCG/ACT inhaler INHALE 2 PUFFS INTO THE LUNGS IN THE MORNING AND AT BEDTIME. 3 each 3   Vitamin D, Ergocalciferol, (DRISDOL) 1.25 MG (50000 UNIT) CAPS capsule Take 1 capsule (50,000 Units total) by mouth every 7 (seven) days for 12 doses. 12 capsule 0   No current facility-administered medications for this visit.    REVIEW OF SYSTEMS:  [X]  denotes positive finding, [ ]  denotes negative finding Cardiac  Comments:  Chest pain or chest pressure:    Shortness of breath upon exertion:    Short of breath when lying flat:    Irregular heart rhythm:        Vascular    Pain in calf, thigh, or hip brought on by ambulation:    Pain in feet at night that wakes you up from your sleep:     Blood clot in your veins:    Leg swelling:         Pulmonary    Oxygen at home:    Productive cough:     Wheezing:         Neurologic    Sudden weakness in arms or legs:     Sudden numbness in arms or legs:     Sudden onset of difficulty speaking or slurred speech:    Temporary loss of vision in one eye:     Problems with dizziness:         Gastrointestinal    Blood in stool:     Vomited blood:         Genitourinary    Burning when urinating:     Blood in urine:        Psychiatric    Major depression:         Hematologic    Bleeding problems:    Problems with blood clotting too easily:        Skin    Rashes or ulcers:  Constitutional    Fever or  chills:      PHYSICAL EXAM: Vitals:   08/11/22 1446 08/11/22 1449  BP: (!) 160/81 (!) 156/86  Pulse: 64 64  Resp: 14   Temp: (!) 97.3 F (36.3 C)   TempSrc: Temporal   SpO2: 96%   Weight: 157 lb (71.2 kg)   Height: 5\' 1"  (1.549 m)     GENERAL: The patient is a well-nourished female, in no acute distress. The vital signs are documented above. CARDIAC: There is a regular rate and rhythm.  VASCULAR:  PULMONARY: No respiratory distress. ABDOMEN: Soft and non-tender. MUSCULOSKELETAL: There are no major deformities or cyanosis. NEUROLOGIC: No focal weakness or paresthesias are detected.  CN II-XII grossly intact. SKIN: There are no ulcers or rashes noted. PSYCHIATRIC: The patient has a normal affect.  DATA:   Carotid duplex today shows 1 to 39% right ICA stenosis and 60 to 79% left ICA stenosis with velocity of 246/75 in the proximal ICA  Assessment/Plan:  72 y.o. female, with history of CKD stage 3, CAD s/p CABG, hypertension, hyperlipidemia that presents for evaluation of carotid stenosis.  She had a carotid duplex in [place], [place] showing greater than 70% left ICA stenosis.  Subsequent had a CTA at [facility] that showed a 58% stenosis of the left carotid bulb and 79 to 80% stenosis of the left ICA (per report, I do not have images).  Patient's carotid artery disease is asymptomatic and she denies any history of strokes or TIAs.  I discussed that for asymptomatic carotid disease we recommend surgical intervention when it is greater than 80%.  I discussed that based on her carotid duplex in our office today her velocity criteria does not meet the threshold for surgery of greater than 80%.  The duplex today shows 60-79% left ICA stenosis.  I would continue to follow her carotid disease.  I will see her in 6 months with repeat carotid ultrasound here in the office.  Aspirin statin for risk reduction.  Discussed she call with questions or concerns.   Cephus Shelling, MD [facility] Office: [phone]

## 2022-08-12 ENCOUNTER — Encounter (INDEPENDENT_AMBULATORY_CARE_PROVIDER_SITE_OTHER): Payer: Self-pay

## 2022-08-13 ENCOUNTER — Other Ambulatory Visit: Payer: Self-pay

## 2022-08-13 DIAGNOSIS — I6522 Occlusion and stenosis of left carotid artery: Secondary | ICD-10-CM

## 2022-08-20 DIAGNOSIS — F4320 Adjustment disorder, unspecified: Secondary | ICD-10-CM | POA: Diagnosis not present

## 2022-08-23 ENCOUNTER — Other Ambulatory Visit: Payer: Self-pay | Admitting: Internal Medicine

## 2022-08-23 DIAGNOSIS — E559 Vitamin D deficiency, unspecified: Secondary | ICD-10-CM

## 2022-09-03 DIAGNOSIS — N1831 Chronic kidney disease, stage 3a: Secondary | ICD-10-CM | POA: Diagnosis not present

## 2022-09-07 ENCOUNTER — Other Ambulatory Visit: Payer: Self-pay | Admitting: Internal Medicine

## 2022-09-07 DIAGNOSIS — M159 Polyosteoarthritis, unspecified: Secondary | ICD-10-CM

## 2022-09-08 ENCOUNTER — Ambulatory Visit: Payer: Medicare HMO | Admitting: Internal Medicine

## 2022-09-08 ENCOUNTER — Other Ambulatory Visit: Payer: Self-pay | Admitting: *Deleted

## 2022-09-08 DIAGNOSIS — E559 Vitamin D deficiency, unspecified: Secondary | ICD-10-CM

## 2022-09-08 DIAGNOSIS — E1159 Type 2 diabetes mellitus with other circulatory complications: Secondary | ICD-10-CM

## 2022-09-08 MED ORDER — LOSARTAN POTASSIUM 100 MG PO TABS: 100.0000 mg | ORAL_TABLET | Freq: Every day | ORAL | 1 refills | Status: DC

## 2022-09-08 MED ORDER — ISOSORBIDE MONONITRATE ER 30 MG PO TB24: 30.0000 mg | ORAL_TABLET | Freq: Every day | ORAL | 1 refills | Status: DC

## 2022-09-08 MED ORDER — METOPROLOL TARTRATE 25 MG PO TABS: 25.0000 mg | ORAL_TABLET | Freq: Two times a day (BID) | ORAL | 1 refills | Status: DC

## 2022-09-08 MED ORDER — LEVOTHYROXINE SODIUM 137 MCG PO TABS: 137.0000 ug | ORAL_TABLET | Freq: Every day | ORAL | 1 refills | Status: DC

## 2022-09-10 DIAGNOSIS — R778 Other specified abnormalities of plasma proteins: Secondary | ICD-10-CM | POA: Diagnosis not present

## 2022-09-10 DIAGNOSIS — I10 Essential (primary) hypertension: Secondary | ICD-10-CM | POA: Diagnosis not present

## 2022-09-10 DIAGNOSIS — N1831 Chronic kidney disease, stage 3a: Secondary | ICD-10-CM | POA: Diagnosis not present

## 2022-09-11 ENCOUNTER — Other Ambulatory Visit (HOSPITAL_COMMUNITY): Payer: Self-pay | Admitting: Nephrology

## 2022-09-11 DIAGNOSIS — N1831 Chronic kidney disease, stage 3a: Secondary | ICD-10-CM

## 2022-09-15 ENCOUNTER — Encounter: Payer: Self-pay | Admitting: Pharmacist

## 2022-09-17 DIAGNOSIS — F4320 Adjustment disorder, unspecified: Secondary | ICD-10-CM | POA: Diagnosis not present

## 2022-09-18 ENCOUNTER — Ambulatory Visit (HOSPITAL_COMMUNITY)
Admission: RE | Admit: 2022-09-18 | Discharge: 2022-09-18 | Disposition: A | Discharge status: Home / Self Care | Payer: Medicare HMO | Source: Ambulatory Visit | Attending: Nephrology | Admitting: Nephrology

## 2022-09-18 DIAGNOSIS — N281 Cyst of kidney, acquired: Secondary | ICD-10-CM | POA: Diagnosis not present

## 2022-09-18 DIAGNOSIS — N1831 Chronic kidney disease, stage 3a: Secondary | ICD-10-CM | POA: Insufficient documentation

## 2022-09-18 DIAGNOSIS — N2889 Other specified disorders of kidney and ureter: Secondary | ICD-10-CM | POA: Diagnosis not present

## 2022-09-21 ENCOUNTER — Encounter: Payer: Self-pay | Admitting: Internal Medicine

## 2022-09-21 ENCOUNTER — Ambulatory Visit (INDEPENDENT_AMBULATORY_CARE_PROVIDER_SITE_OTHER): Payer: Medicare HMO | Admitting: Internal Medicine

## 2022-09-21 VITALS — BP 120/74 | HR 70 | Temp 97.8°F | Wt 157.7 lb

## 2022-09-21 DIAGNOSIS — E1159 Type 2 diabetes mellitus with other circulatory complications: Secondary | ICD-10-CM

## 2022-09-21 DIAGNOSIS — E785 Hyperlipidemia, unspecified: Secondary | ICD-10-CM

## 2022-09-21 DIAGNOSIS — Z23 Encounter for immunization: Secondary | ICD-10-CM

## 2022-09-21 DIAGNOSIS — N183 Chronic kidney disease, stage 3 unspecified: Secondary | ICD-10-CM

## 2022-09-21 DIAGNOSIS — E559 Vitamin D deficiency, unspecified: Secondary | ICD-10-CM | POA: Diagnosis not present

## 2022-09-21 DIAGNOSIS — H9190 Unspecified hearing loss, unspecified ear: Secondary | ICD-10-CM | POA: Diagnosis not present

## 2022-09-21 DIAGNOSIS — R778 Other specified abnormalities of plasma proteins: Secondary | ICD-10-CM | POA: Diagnosis not present

## 2022-09-21 DIAGNOSIS — H9193 Unspecified hearing loss, bilateral: Secondary | ICD-10-CM | POA: Insufficient documentation

## 2022-09-21 DIAGNOSIS — I10 Essential (primary) hypertension: Secondary | ICD-10-CM

## 2022-09-21 DIAGNOSIS — I6522 Occlusion and stenosis of left carotid artery: Secondary | ICD-10-CM

## 2022-09-21 LAB — POCT GLYCOSYLATED HEMOGLOBIN (HGB A1C): Hemoglobin A1C: 6.5 % — AB (ref 4.0–5.6)

## 2022-09-21 NOTE — Progress Notes (Signed)
Established Patient Office Visit     CC/Reason for Visit: Follow-up chronic conditions  HPI: Jeremiah Ostheimer is a 72 y.o. female who is coming in today for the above mentioned reasons. Past Medical History is significant for: Hypertension, hyperlipidemia, type 2 diabetes, chronic kidney disease stage III, carotid artery disease, hypothyroidism.  Since I last saw her she has been seen by vascular surgery for a 60 to 79% left ICA stenosis with plans to follow-up in 6 months.  She also had an abnormal SPEP and is needing some additional workup coordinated by nephrology.  Has been having some difficulty hearing.   Past Medical/Surgical History: Past Medical History:  Diagnosis Date   Blood in stool    Chronic kidney disease    High cholesterol    Hypertension    Hypothyroid    Urinary tract infection     Past Surgical History:  Procedure Laterality Date   ABDOMINAL HYSTERECTOMY     AORTIC VALVE REPLACEMENT (AVR)/CORONARY ARTERY BYPASS GRAFTING (CABG)      Social History:  reports that she quit smoking about 12 years ago. Her smoking use included cigarettes. She started smoking about 32 years ago. She has a 10 pack-year smoking history. She has never used smokeless tobacco. She reports current alcohol use. She reports that she does not currently use drugs.  Allergies: No Known Allergies  Family History:  Family History  Problem Relation Age of Onset   ALS Mother      Current Outpatient Medications:    amLODipine (NORVASC) 10 MG tablet, TAKE 1 TABLET EVERY DAY, Disp: 90 tablet, Rfl: 1   ASPIRIN 81 PO, Take 1 tablet by mouth daily., Disp: , Rfl:    atorvastatin (LIPITOR) 40 MG tablet, Take 1 tablet (40 mg total) by mouth at bedtime., Disp: 90 tablet, Rfl: 1   Cod Liver Oil CAPS, Take by mouth., Disp: , Rfl:    diclofenac Sodium (VOLTAREN) 1 % GEL, APPLY 2 GRAMS TOPICALLY 4 TIMES DAILY, Disp: 600 g, Rfl: 2   isosorbide mononitrate (IMDUR) 30 MG 24 hr tablet, Take 1 tablet  (30 mg total) by mouth daily., Disp: 90 tablet, Rfl: 1   latanoprost (XALATAN) 0.005 % ophthalmic solution, 1 drop at bedtime., Disp: , Rfl:    levothyroxine (SYNTHROID) 137 MCG tablet, Take 1 tablet (137 mcg total) by mouth daily., Disp: 90 tablet, Rfl: 1   losartan (COZAAR) 100 MG tablet, Take 1 tablet (100 mg total) by mouth daily., Disp: 90 tablet, Rfl: 1   metoprolol tartrate (LOPRESSOR) 25 MG tablet, Take 1 tablet (25 mg total) by mouth 2 (two) times daily., Disp: 180 tablet, Rfl: 1   Multiple Vitamins-Minerals (MULTIVITAMIN ADULTS 50+ PO), Take 1 tablet by mouth daily., Disp: , Rfl:    SYMBICORT 160-4.5 MCG/ACT inhaler, INHALE 2 PUFFS INTO THE LUNGS IN THE MORNING AND AT BEDTIME., Disp: 3 each, Rfl: 3  Review of Systems:  Negative unless indicated in HPI.   Physical Exam: Vitals:   09/21/22 1508  BP: 120/74  Pulse: 70  Temp: 97.8 F (36.6 C)  TempSrc: Oral  SpO2: 98%  Weight: 157 lb 11.2 oz (71.5 kg)    Body mass index is 29.8 kg/m.   Physical Exam Vitals reviewed.  Constitutional:      General: She is not in acute distress.    Appearance: Normal appearance. She is not ill-appearing, toxic-appearing or diaphoretic.  HENT:     Head: Normocephalic.     Right Ear: Tympanic membrane,  ear canal and external ear normal. There is no impacted cerumen.     Left Ear: Tympanic membrane, ear canal and external ear normal. There is no impacted cerumen.     Nose: Nose normal.     Mouth/Throat:     Mouth: Mucous membranes are moist.     Pharynx: Oropharynx is clear. No oropharyngeal exudate or posterior oropharyngeal erythema.  Eyes:     General: No scleral icterus.       Right eye: No discharge.        Left eye: No discharge.     Conjunctiva/sclera: Conjunctivae normal.     Pupils: Pupils are equal, round, and reactive to light.  Neck:     Vascular: No carotid bruit.  Cardiovascular:     Rate and Rhythm: Normal rate and regular rhythm.     Pulses: Normal pulses.     Heart  sounds: Normal heart sounds.  Pulmonary:     Effort: Pulmonary effort is normal. No respiratory distress.     Breath sounds: Normal breath sounds.  Abdominal:     General: Abdomen is flat. Bowel sounds are normal.     Palpations: Abdomen is soft.  Musculoskeletal:        General: Normal range of motion.     Cervical back: Normal range of motion.  Skin:    General: Skin is warm and dry.  Neurological:     General: No focal deficit present.     Mental Status: She is alert and oriented to person, place, and time. Mental status is at baseline.  Psychiatric:        Mood and Affect: Mood normal.        Behavior: Behavior normal.        Thought Content: Thought content normal.        Judgment: Judgment normal.     Flowsheet Row Office Visit from 09/21/2022 in [facility] [place]  PHQ-9 Total Score 0      Hearing Screening   500Hz  1000Hz  2000Hz  4000Hz   Right ear Fail Pass Pass Pass  Left ear Fail Pass Pass Pass    Impression and Plan:  Type 2 diabetes mellitus with other circulatory complication, without long-term current use of insulin (HCC) Assessment & Plan: Well-controlled on current with an A1c of 6.5 today.  Orders: -     POCT glycosylated hemoglobin (Hb A1C)  Hearing loss, unspecified hearing loss type, unspecified laterality Assessment & Plan: Refer to audiology.  Orders: -     Ambulatory referral to Audiology  Vitamin D deficiency Assessment & Plan: Recheck levels today  Orders: -     VITAMIN D 25 Hydroxy (Vit-D Deficiency, Fractures); Future  Needs flu shot -     Flu Vaccine Trivalent High Dose (Fluad)  Essential hypertension Assessment & Plan: Well-controlled on current.   Stage 3 chronic kidney disease, unspecified whether stage 3a or 3b CKD (HCC) Assessment & Plan: Followed by nephrology.  Baseline creatinine around 1.2-1.4.   Hyperlipidemia with target LDL less than 70 Assessment & Plan: Recent LDL 70 on  atorvastatin.   Abnormal SPEP Assessment & Plan: Per orders of nephrology have ordered urine electrophoresis and light chains and serum.  Results to be faxed to them.  Orders: -     Kappa/Lambda Light Chains, Free, With Ratio, 24Hr. Urine -     Protein Electrophoresis, with Total Protein and Reflex to IFE, Urine  Bilateral hearing loss, unspecified hearing loss type Assessment & Plan: Audiology referral  placed today.   Stenosis of left carotid artery Assessment & Plan: Being followed by vascular surgery.  Recent carotid ultrasound with a 60 to 79% left ICA stenosis.      Time spent:34 minutes reviewing chart, interviewing and examining patient and formulating plan of care.     Chaya Jan, MD [facility]

## 2022-09-21 NOTE — Assessment & Plan Note (Signed)
Audiology referral placed today

## 2022-09-21 NOTE — Assessment & Plan Note (Signed)
Being followed by vascular surgery.  Recent carotid ultrasound with a 60 to 79% left ICA stenosis.

## 2022-09-21 NOTE — Assessment & Plan Note (Signed)
Followed by nephrology.  Baseline creatinine around 1.2-1.4.

## 2022-09-21 NOTE — Assessment & Plan Note (Signed)
Recheck levels today

## 2022-09-21 NOTE — Assessment & Plan Note (Signed)
Well controlled on current

## 2022-09-21 NOTE — Addendum Note (Signed)
Addended by: Marian Sorrow D on: 09/21/2022 04:05 PM   Modules accepted: Orders

## 2022-09-21 NOTE — Assessment & Plan Note (Signed)
Refer to audiology.

## 2022-09-21 NOTE — Assessment & Plan Note (Signed)
Recent LDL 70 on atorvastatin.

## 2022-09-21 NOTE — Assessment & Plan Note (Signed)
Per orders of nephrology have ordered urine electrophoresis and light chains and serum.  Results to be faxed to them.

## 2022-09-21 NOTE — Addendum Note (Signed)
Addended by: Kern Reap B on: 09/21/2022 04:01 PM   Modules accepted: Orders

## 2022-09-21 NOTE — Assessment & Plan Note (Signed)
Well-controlled on current with an A1c of 6.5 today.

## 2022-09-22 ENCOUNTER — Encounter: Payer: Self-pay | Admitting: Pulmonary Disease

## 2022-09-22 ENCOUNTER — Ambulatory Visit (INDEPENDENT_AMBULATORY_CARE_PROVIDER_SITE_OTHER): Payer: Medicare HMO | Admitting: Pulmonary Disease

## 2022-09-22 VITALS — BP 128/68 | HR 72 | Temp 98.3°F | Ht 61.0 in | Wt 155.0 lb

## 2022-09-22 DIAGNOSIS — R0609 Other forms of dyspnea: Secondary | ICD-10-CM | POA: Diagnosis not present

## 2022-09-22 LAB — VITAMIN D 25 HYDROXY (VIT D DEFICIENCY, FRACTURES): VITD: 41.69 ng/mL (ref 30.00–100.00)

## 2022-09-22 NOTE — Patient Instructions (Signed)
Okay to decrease Symbicort to 2 puffs once a day  If symptoms remain well-controlled on the next 1-2 okay to stop Symbicort altogether  Return to clinic in 6 months or sooner as needed with Dr. Judeth Horn

## 2022-09-22 NOTE — Progress Notes (Signed)
@Patient  ID: Tasha Garrett, female    DOB: 04-02-1950, 72 y.o.   MRN: [id number]  Chief Complaint  Patient presents with   Follow-up    Doing well.  No sx noted today    Referring provider: Philip Aspen, [place]*  HPI:   72 y.o. woman whom we are seeing in consultation for evaluation of dyspnea on exertion.  Most recent PCP note x 3 reviewed.  Continues on Symbicort.  Doing well.  Symptoms made totally controlled.  No significant dyspnea or cough.  No concerns.  Discussed de-escalation today.  Okay to de-escalate to once daily 2 puffs.  If doing well in 2 to 3 months okay to stop altogether.  If symptoms were to worsen she should resume what ever dose she was taking and symptoms are well-controlled.  Explained in detail.  HPI at initial visit: Patient with dyspnea for the last several months to couple years.  Worse on inclines or stairs.  No time of day when things are better or worse.  No seasonal environmental factors she can identify that make things better or worse.  No position that makes things better or worse.  She is used albuterol as needed.  She is not sure this helps it much at all.  No other alleviating or exacerbating factors.  Most recent chest imaging CT lung cancer 06/2021 personally reviewed and interpreted as mild emphysematous changes at the apices, mild lower lobe bronchiectasis, otherwise clear.  PMH: Hypertension, hyperlipidemia Surgical history: Hysterectomy, aortic valve replacement/CABG Family history: Mother with ALS Social history: Former smoker, 10-pack-year, quit 2012, lives in [place], works in [employer]	[place] at [employer] / Pulmonary Flowsheets:   ACT:      No data to display          MMRC:     No data to display          Epworth:      No data to display          Tests:   FENO:  No results found for: "NITRICOXIDE"  PFT:     No data to display          WALK:      No data  to display          Imaging: Personally reviewed and as per EMR discussion in this note No results found.  Lab Results: Personally reviewed, notably eosinophils 400 07/2021 CBC    Component Value Date/Time   WBC 6.9 05/20/2022 1114   RBC 3.90 05/20/2022 1114   HGB 11.4 (L) 05/20/2022 1114   HCT 34.5 (L) 05/20/2022 1114   PLT 347.0 05/20/2022 1114   MCV 88.4 05/20/2022 1114   MCHC 33.0 05/20/2022 1114   RDW 13.2 05/20/2022 1114   LYMPHSABS 1.8 05/20/2022 1114   MONOABS 0.7 05/20/2022 1114   EOSABS 0.5 05/20/2022 1114   BASOSABS 0.1 05/20/2022 1114    BMET    Component Value Date/Time   NA 141 05/20/2022 1114   NA 143 05/27/2021 0000   K 5.1 05/20/2022 1114   CL 106 05/20/2022 1114   CO2 26 05/20/2022 1114   GLUCOSE 143 (H) 05/20/2022 1114   BUN 26 (H) 05/20/2022 1114   CREATININE 1.29 (H) 05/20/2022 1114   CALCIUM 10.8 (H) 05/20/2022 1114    BNP No results found for: "BNP"  ProBNP No results found for: "PROBNP"  Specialty Problems   None   No Known Allergies  Immunization History  Administered Date(s) Administered   Fluad Quad(high Dose 65+) 11/11/2021   Fluad Trivalent(High Dose 65+) 09/21/2022   Influenza, High Dose Seasonal PF 11/04/2015, 11/17/2016, 11/18/2017, 11/24/2018, 10/03/2019, 09/30/2020   Influenza-Unspecified 10/26/2020   Moderna Sars-Covid-2 Vaccination 03/13/2019, 04/08/2019, 03/01/2020   Pneumococcal Conjugate-13 12/14/2017   Pneumococcal Polysaccharide-23 05/25/2016   Td (Adult),5 Lf Tetanus Toxid, Preservative Free 10/22/2017   Tdap 10/22/2017   Zoster Recombinant(Shingrix) 10/31/2018, 12/30/2018    Past Medical History:  Diagnosis Date   Blood in stool    Chronic kidney disease    High cholesterol    Hypertension    Hypothyroid    Urinary tract infection     Tobacco History: Social History   Tobacco Use  Smoking Status Former   Current packs/day: 0.00   Average packs/day: 0.5 packs/day for 20.0 years (10.0 ttl  pk-yrs)   Types: Cigarettes   Start date: 04/1990   Quit date: 04/2010   Years since quitting: 12.4  Smokeless Tobacco Never  Tobacco Comments   Pt states she stopped smoking 10 years ago and at most smoked 1/2 ppd. ALS 10/12   Counseling given: Not Answered Tobacco comments: Pt states she stopped smoking 10 years ago and at most smoked 1/2 ppd. ALS 10/12   Continue to not smoke  Outpatient Encounter Medications as of 09/22/2022  Medication Sig   amLODipine (NORVASC) 10 MG tablet TAKE 1 TABLET EVERY DAY   ASPIRIN 81 PO Take 1 tablet by mouth daily.   atorvastatin (LIPITOR) 40 MG tablet Take 1 tablet (40 mg total) by mouth at bedtime.   brimonidine (ALPHAGAN) 0.2 % ophthalmic solution Place 1 drop into both eyes 2 (two) times daily.   Cod Liver Oil CAPS Take by mouth.   diclofenac Sodium (VOLTAREN) 1 % GEL APPLY 2 GRAMS TOPICALLY 4 TIMES DAILY   isosorbide mononitrate (IMDUR) 30 MG 24 hr tablet Take 1 tablet (30 mg total) by mouth daily.   latanoprost (XALATAN) 0.005 % ophthalmic solution 1 drop at bedtime.   levothyroxine (SYNTHROID) 137 MCG tablet Take 1 tablet (137 mcg total) by mouth daily.   losartan (COZAAR) 100 MG tablet Take 1 tablet (100 mg total) by mouth daily.   metoprolol tartrate (LOPRESSOR) 25 MG tablet Take 1 tablet (25 mg total) by mouth 2 (two) times daily.   Multiple Vitamins-Minerals (MULTIVITAMIN ADULTS 50+ PO) Take 1 tablet by mouth daily.   SYMBICORT 160-4.5 MCG/ACT inhaler INHALE 2 PUFFS INTO THE LUNGS IN THE MORNING AND AT BEDTIME.   No facility-administered encounter medications on file as of 09/22/2022.     Review of Systems  Review of Systems  N/a Physical Exam  BP 128/68 (BP Location: Left Arm, Patient Position: Sitting, Cuff Size: Large)   Pulse 72   Temp 98.3 F (36.8 C) (Oral)   Ht 5\' 1"  (1.549 m)   Wt 155 lb (70.3 kg)   SpO2 100%   BMI 29.29 kg/m   Wt Readings from Last 5 Encounters:  09/22/22 155 lb (70.3 kg)  09/21/22 157 lb 11.2 oz  (71.5 kg)  08/11/22 157 lb (71.2 kg)  07/29/22 156 lb 6.4 oz (70.9 kg)  05/20/22 160 lb 6.4 oz (72.8 kg)    BMI Readings from Last 5 Encounters:  09/22/22 29.29 kg/m  09/21/22 29.80 kg/m  08/11/22 29.66 kg/m  07/29/22 29.07 kg/m  05/20/22 29.82 kg/m     Physical Exam General: Sitting in chair, no acute distress Eyes: EOMI, no icterus Neck: Supple, no JVP Pulmonary: Clear, normal work  of breathing Cardiovascular: Warm, no edema Abdomen: Nondistended, bowel sounds present MSK: No synovitis, no joint effusion Neuro: Normal gait, no weakness Psych: Normal mood, full affect   Assessment & Plan:   Dyspnea on exertion, wheeze, cough: Asthma versus smoking-related lung disease.  Emphysema albeit mild on prior CT scan.  No PFTs in the past.  Marked improvement on high-dose Symbicort 2 puff twice daily.  Will decrease to 2 puffs once daily and de-escalation strategy.  Okay to discontinue altogether if symptoms well-controlled on decreased dose after 2 to 3 months.  Cough: Likely multifactorial.  Related to smoking-related disease or asthma as above.  She also cleared describes aspiration symptoms when she swallows.  She declined speech pathologist evaluation.  She has bilateral lower lobe bronchiectasis likely in the setting of chronic aspiration.  Improved with inhaler therapy.  De-escalating Symbicort.   Return in about 6 months (around 03/22/2023).   Karren Burly, MD 09/22/2022

## 2022-09-23 ENCOUNTER — Other Ambulatory Visit: Payer: Self-pay | Admitting: Internal Medicine

## 2022-09-23 DIAGNOSIS — N183 Chronic kidney disease, stage 3 unspecified: Secondary | ICD-10-CM | POA: Diagnosis not present

## 2022-09-24 ENCOUNTER — Telehealth: Payer: Self-pay | Admitting: Internal Medicine

## 2022-09-24 LAB — KAPPA/LAMBDA LIGHT CHAINS, FREE, WITH RATIO, 24HR. URINE
KAPPA Ligh Chain, Free U: 5.79 mg/L (ref <=32.90)
Kappa/Lambda,Free Ratio: 4.91 (ref <=8.69)
Lambda Light Chain, Free U: 1.18 mg/L (ref <=3.79)

## 2022-09-24 NOTE — Telephone Encounter (Signed)
Spoke to patient and reviewed lab results. 

## 2022-09-24 NOTE — Telephone Encounter (Signed)
Pt called, returning CMA's call. CMA was unavailable. Pt asked that CMA call back at her earliest convenience. 

## 2022-09-26 LAB — PROTEIN ELECTROPHORESIS,W/TOTAL PROTEIN AND RFLX TO IFE,URINE
Albumin: 56 %
Alpha-1-Globulin, U: 5 %
Alpha-2-Globulin, U: 14 %
Beta Globulin, U: 9 %
Creatinine, 24H Ur: 0.79 g/(24.h) (ref 0.50–2.15)
Gamma Globulin, U: 15 %
PROTEIN/CREATININE RATIO: 0.389 mg/mg{creat} — ABNORMAL HIGH (ref <0.150)
PROTEIN/CREATININE RATIO: 389 mg/g{creat} — ABNORMAL HIGH (ref <150)
Protein, 24H Urine: 308 mg/(24.h) — ABNORMAL HIGH (ref 0–149)

## 2022-10-01 DIAGNOSIS — F4320 Adjustment disorder, unspecified: Secondary | ICD-10-CM | POA: Diagnosis not present

## 2022-10-02 DIAGNOSIS — E785 Hyperlipidemia, unspecified: Secondary | ICD-10-CM | POA: Diagnosis not present

## 2022-10-02 DIAGNOSIS — Z6830 Body mass index (BMI) 30.0-30.9, adult: Secondary | ICD-10-CM | POA: Diagnosis not present

## 2022-10-02 DIAGNOSIS — I2510 Atherosclerotic heart disease of native coronary artery without angina pectoris: Secondary | ICD-10-CM | POA: Diagnosis not present

## 2022-10-02 DIAGNOSIS — I10 Essential (primary) hypertension: Secondary | ICD-10-CM | POA: Diagnosis not present

## 2022-10-02 DIAGNOSIS — N183 Chronic kidney disease, stage 3 unspecified: Secondary | ICD-10-CM | POA: Diagnosis not present

## 2022-10-02 DIAGNOSIS — Z6829 Body mass index (BMI) 29.0-29.9, adult: Secondary | ICD-10-CM | POA: Diagnosis not present

## 2022-10-02 DIAGNOSIS — I6529 Occlusion and stenosis of unspecified carotid artery: Secondary | ICD-10-CM | POA: Diagnosis not present

## 2022-10-09 ENCOUNTER — Other Ambulatory Visit: Payer: Self-pay | Admitting: Internal Medicine

## 2022-10-14 DIAGNOSIS — F4320 Adjustment disorder, unspecified: Secondary | ICD-10-CM | POA: Diagnosis not present

## 2022-10-29 ENCOUNTER — Ambulatory Visit: Payer: Medicare HMO | Attending: Internal Medicine | Admitting: Audiology

## 2022-10-29 DIAGNOSIS — H903 Sensorineural hearing loss, bilateral: Secondary | ICD-10-CM | POA: Diagnosis not present

## 2022-10-29 NOTE — Procedures (Signed)
Outpatient Audiology and [facility] [address] [place], [place]  [address] [phone]  AUDIOLOGICAL  EVALUATION  NAME: Tasha Garrett     DOB:   [birth year]      MRN: [id number]                                                                                     DATE: 10/29/2022     REFERENT: Philip Aspen, Limmie Patricia, MD STATUS: Outpatient DIAGNOSIS: Sensorineural hearing loss, bilateral  History: Tasha Garrett was seen for an audiological evaluation due to decreased hearing.  Tasha Garrett reports a history of occupational noise exposure from working in [employer] for many years.  She reports intermittent, bilateral, tinnitus.  She reports sometimes the tinnitus is bothersome.  Tasha Garrett denies otalgia, aural fullness, and vertigo.  Tasha Garrett reports her daughter has concerns regarding Tasha Garrett's hearing sensitivity.   Evaluation:  Otoscopy showed a clear view of the tympanic membranes, bilaterally Tympanometry results were consistent with normal middle ear pressure and normal tympanic membrane mobility (Type A) bilaterally Audiometric testing was completed using Conventional Audiometry techniques with insert earphones and TDH headphones. Test results are consistent with normal hearing sensitivity at 250 Hz to 2000 Hz sloping to a mild to moderate sensorineural hearing loss.  A noise notch is noted at 3000 Hz.  Speech Recognition Thresholds were obtained at 20 dB HL in the right ear and at 10 dB HL in the left ear. Word Recognition Testing was completed at 70 dB HL and Tasha Garrett scored 100% bilaterally.    Results:  The test results were reviewed with Tasha Garrett.  Today's test results are consistent with normal hearing sensitivity sloping to a mild to moderate sensorineural hearing loss, bilaterally.  Tasha Garrett will benefit from the use of good communication strategies.  Hearing aids are not recommended at this time for Tasha Garrett due to the configuration of her hearing loss.  Tinnitus management  strategies were reviewed.   Recommendations: 1.   Return in 2 to 3 years for audiological monitoring   30 minutes spent testing and counseling on results.   If you have any questions please feel free to contact me at [phone].  Marton Redwood Audiologist, Au.D., CCC-A 10/29/2022  4:13 PM  Cc: Philip Aspen, Limmie Patricia, MD

## 2022-10-30 ENCOUNTER — Telehealth: Payer: Self-pay | Admitting: Internal Medicine

## 2022-10-30 DIAGNOSIS — Z1231 Encounter for screening mammogram for malignant neoplasm of breast: Secondary | ICD-10-CM

## 2022-10-30 NOTE — Telephone Encounter (Signed)
Pt called to request a referral for a Mammogram. Pt informed MD is OOO on Fridays. Please call Pt back to discuss.

## 2022-11-02 ENCOUNTER — Encounter: Payer: Self-pay | Admitting: Family Medicine

## 2022-11-02 ENCOUNTER — Ambulatory Visit (INDEPENDENT_AMBULATORY_CARE_PROVIDER_SITE_OTHER): Payer: Medicare HMO | Admitting: Family Medicine

## 2022-11-02 VITALS — BP 140/60 | HR 63 | Temp 98.0°F | Ht 61.0 in | Wt 156.0 lb

## 2022-11-02 DIAGNOSIS — M5442 Lumbago with sciatica, left side: Secondary | ICD-10-CM

## 2022-11-02 DIAGNOSIS — I10 Essential (primary) hypertension: Secondary | ICD-10-CM

## 2022-11-02 MED ORDER — CYCLOBENZAPRINE HCL 5 MG PO TABS: 5.0000 mg | ORAL_TABLET | Freq: Every evening | ORAL | 0 refills | Status: DC | PRN

## 2022-11-02 NOTE — Progress Notes (Signed)
Established Patient Office Visit   Subjective  Patient ID: Tasha Garrett, female    DOB: 1950/07/13  Age: 72 y.o. MRN: [id number]  Chief Complaint  Patient presents with   Back Pain    Lower back pain that radiates laterally down left leg X4days     Patient is a 72 year old female followed by Dr. Ardyth Harps and seen for acute concern.  Patient endorses left-sided low back pain with radiation down LLE x 4 days.  Pain described as a throbbing, 5-6/10 pain making it difficult to walk.  When the pain first started pt was cleaning a table.  Pain increased on Friday but has eased some.  Patient denies numbness, tingling in LLE.  Denies loss of bowel or bladder.  Tried Tylenol for symptoms.  Unable to use topical creams due to skin irritation.  Back Pain    Patient Active Problem List   Diagnosis Date Noted   Bilateral hearing loss 09/21/2022   Abnormal SPEP 09/21/2022   Hearing loss 09/21/2022   Carotid stenosis 08/11/2022   Vitamin D deficiency 06/01/2022   Microalbuminuria due to type 2 diabetes mellitus (HCC) 05/12/2021   DM (diabetes mellitus), type 2 (HCC) 05/08/2021   Chronic kidney disease, stage 3 (HCC) 04/04/2019   Coronary arteriosclerosis 04/04/2019   Essential hypertension 04/04/2019   Hx of non-ST elevation myocardial infarction (NSTEMI) 05/14/2011   Hyperlipidemia with target LDL less than 70 05/14/2011   Hypothyroid 05/14/2011   Past Medical History:  Diagnosis Date   Blood in stool    Chronic kidney disease    High cholesterol    Hypertension    Hypothyroid    Urinary tract infection    Past Surgical History:  Procedure Laterality Date   ABDOMINAL HYSTERECTOMY     AORTIC VALVE REPLACEMENT (AVR)/CORONARY ARTERY BYPASS GRAFTING (CABG)     Social History   Tobacco Use   Smoking status: Former    Current packs/day: 0.00    Average packs/day: 0.5 packs/day for 20.0 years (10.0 ttl pk-yrs)    Types: Cigarettes    Start date: 04/1990    Quit date: 04/2010     Years since quitting: 12.5   Smokeless tobacco: Never   Tobacco comments:    Pt states she stopped smoking 10 years ago and at most smoked 1/2 ppd. ALS 10/12  Substance Use Topics   Alcohol use: Yes   Drug use: Not Currently   Family History  Problem Relation Age of Onset   ALS Mother    No Known Allergies    Review of Systems  Musculoskeletal:  Positive for back pain.   Negative unless stated above    Objective:     BP (!) 140/60   Pulse 63   Temp 98 F (36.7 C) (Oral)   Ht 5\' 1"  (1.549 m)   Wt 156 lb (70.8 kg)   SpO2 99%   BMI 29.48 kg/m  BP Readings from Last 3 Encounters:  11/02/22 (!) 140/60  09/22/22 128/68  09/21/22 120/74   Wt Readings from Last 3 Encounters:  11/02/22 156 lb (70.8 kg)  09/22/22 155 lb (70.3 kg)  09/21/22 157 lb 11.2 oz (71.5 kg)      Physical Exam Constitutional:      General: She is not in acute distress.    Appearance: Normal appearance.  HENT:     Head: Normocephalic and atraumatic.     Nose: Nose normal.     Mouth/Throat:     Mouth: Mucous membranes are  moist.  Cardiovascular:     Rate and Rhythm: Normal rate and regular rhythm.     Heart sounds: Normal heart sounds. No murmur heard.    No gallop.  Pulmonary:     Effort: Pulmonary effort is normal. No respiratory distress.     Breath sounds: Normal breath sounds. No wheezing, rhonchi or rales.  Musculoskeletal:     Cervical back: Normal.     Thoracic back: Normal.     Lumbar back: Tenderness present.       Back:     Comments: TTP of lateral L low back, L sciatic nerve, posterior thigh, and lateral thigh/IT band.  Skin:    General: Skin is warm and dry.  Neurological:     Mental Status: She is alert and oriented to person, place, and time.     No results found for any visits on 11/02/22.    Assessment & Plan:  Acute left-sided low back pain with left-sided sciatica -     Cyclobenzaprine HCl; Take 1 tablet (5 mg total) by mouth at bedtime as needed.  Dispense:  30 tablet; Refill: 0  Essential hypertension  Acute left-sided low back pain with sciatica 2/2 muscle strain while cleaning a table.  Discussed supportive care including stretching, ice, heat, Tylenol.  Given history of DM2 patient declines prednisone.  Rx for Flexeril to pharmacy.  Advised may cause drowsiness.  Advised to consider PT.  BP improved with recheck.  Elevation likely 2/2 stress of dealing with traffic this morning prior to appointment.  Patient encouraged to continue checking BP at home and keeping a log of readings.  For consistent elevations greater than 140/90 adjust medication.  Continue current meds.  Return if symptoms worsen or fail to improve.   Deeann Saint, MD

## 2022-11-02 NOTE — Telephone Encounter (Signed)
Referral placed and patient is aware. 

## 2022-11-02 NOTE — Patient Instructions (Signed)
A prescription for Flexeril was sent to your pharmacy.  You can try taking this at night to help with symptoms as it may cause drowsiness.  You can use heat, ice, stretching, and Tylenol for symptoms.  For continued symptoms consider physical therapy.

## 2022-11-17 ENCOUNTER — Ambulatory Visit: Payer: Medicare HMO | Admitting: Family Medicine

## 2022-11-17 ENCOUNTER — Ambulatory Visit: Payer: Medicare HMO

## 2022-11-17 VITALS — BP 110/62 | HR 61 | Temp 98.1°F | Wt 153.0 lb

## 2022-11-17 DIAGNOSIS — M5442 Lumbago with sciatica, left side: Secondary | ICD-10-CM | POA: Diagnosis not present

## 2022-11-17 DIAGNOSIS — M5126 Other intervertebral disc displacement, lumbar region: Secondary | ICD-10-CM | POA: Diagnosis not present

## 2022-11-17 DIAGNOSIS — M51362 Other intervertebral disc degeneration, lumbar region with discogenic back pain and lower extremity pain: Secondary | ICD-10-CM | POA: Diagnosis not present

## 2022-11-17 DIAGNOSIS — M419 Scoliosis, unspecified: Secondary | ICD-10-CM | POA: Diagnosis not present

## 2022-11-17 DIAGNOSIS — M25562 Pain in left knee: Secondary | ICD-10-CM | POA: Diagnosis not present

## 2022-11-17 DIAGNOSIS — M1712 Unilateral primary osteoarthritis, left knee: Secondary | ICD-10-CM | POA: Diagnosis not present

## 2022-11-17 DIAGNOSIS — M25462 Effusion, left knee: Secondary | ICD-10-CM | POA: Diagnosis not present

## 2022-11-17 DIAGNOSIS — M899 Disorder of bone, unspecified: Secondary | ICD-10-CM

## 2022-11-17 DIAGNOSIS — M438X5 Other specified deforming dorsopathies, thoracolumbar region: Secondary | ICD-10-CM | POA: Diagnosis not present

## 2022-11-17 NOTE — Progress Notes (Signed)
   Subjective:    Patient ID: Tasha Garrett, female    DOB: [birth year], 72 y.o.   MRN: [id number]  HPI Here for 2 weeks of left sided low back pain and now one week of left knee pain. No hx of trauma. She takes Tylenol with mixed results. She saw Dr. Salomon Fick on 11-02-22 for this and she gave her Flexeril. This has not helped at all. She remains active, she walks 15 minutes every morning, and she works part time.    Review of Systems  Constitutional: Negative.   Respiratory: Negative.    Cardiovascular: Negative.   Musculoskeletal:  Positive for arthralgias and back pain.       Objective:   Physical Exam Constitutional:      General: She is not in acute distress.    Comments: She walks normally   Cardiovascular:     Rate and Rhythm: Normal rate and regular rhythm.     Pulses: Normal pulses.     Heart sounds: Normal heart sounds.  Pulmonary:     Effort: Pulmonary effort is normal.     Breath sounds: Normal breath sounds.  Musculoskeletal:     Comments: She is tender in the left lower back and the left sciatic notch. ROM of the spine is full. She is also tender in the anterior left knee. No obvious swelling. ROM of her knee is limited by pain  Neurological:     Mental Status: She is alert.           Assessment & Plan:  Left low back pain and left knee pain. We will send her for Xrays of these areas today. She will use Tylenol as needed.  Gershon Crane, MD

## 2022-11-20 ENCOUNTER — Other Ambulatory Visit: Payer: Self-pay | Admitting: Internal Medicine

## 2022-11-20 DIAGNOSIS — E785 Hyperlipidemia, unspecified: Secondary | ICD-10-CM

## 2022-11-23 ENCOUNTER — Telehealth: Payer: Self-pay | Admitting: Internal Medicine

## 2022-11-23 DIAGNOSIS — E785 Hyperlipidemia, unspecified: Secondary | ICD-10-CM

## 2022-11-23 MED ORDER — LEVOTHYROXINE SODIUM 137 MCG PO TABS
137.0000 ug | ORAL_TABLET | Freq: Every day | ORAL | 1 refills | Status: DC
  Filled 2023-07-16: qty 90, 90d supply, fill #0

## 2022-11-23 MED ORDER — ISOSORBIDE MONONITRATE ER 30 MG PO TB24: 30.0000 mg | ORAL_TABLET | Freq: Every day | ORAL | 1 refills | Status: DC

## 2022-11-23 MED ORDER — ATORVASTATIN CALCIUM 40 MG PO TABS: 40.0000 mg | ORAL_TABLET | Freq: Every day | ORAL | 1 refills | Status: DC

## 2022-11-23 MED ORDER — LOSARTAN POTASSIUM 100 MG PO TABS: 100.0000 mg | ORAL_TABLET | Freq: Every day | ORAL | 1 refills | Status: DC

## 2022-11-23 MED ORDER — AMLODIPINE BESYLATE 10 MG PO TABS: ORAL_TABLET | ORAL | 1 refills | Status: DC

## 2022-11-23 NOTE — Addendum Note (Signed)
Addended by: Kern Reap B on: 11/23/2022 03:06 PM   Modules accepted: Orders

## 2022-11-23 NOTE — Telephone Encounter (Signed)
Refills sent

## 2022-11-23 NOTE — Telephone Encounter (Signed)
Requesting scripts for atorvastatin (LIPITOR) 40 MG tablet  isosorbide mononitrate (IMDUR) 30 MG 24 hr tablet  losartan (COZAAR) 100 MG tablet   levothyroxine (SYNTHROID) 137 MCG tablet   amLODipine (NORVASC) 10 MG tablet [facility] - [place], [place] - [address] Phone: [phone]  Fax: [phone]

## 2022-11-25 ENCOUNTER — Telehealth: Payer: Self-pay | Admitting: *Deleted

## 2022-11-25 DIAGNOSIS — R4589 Other symptoms and signs involving emotional state: Secondary | ICD-10-CM

## 2022-11-25 NOTE — Telephone Encounter (Signed)
Patient is requesting a referral to behavior health closer to home.  Tasha Garrett is possible.  Okay to place the referral?

## 2022-11-26 ENCOUNTER — Ambulatory Visit
Admission: RE | Admit: 2022-11-26 | Discharge: 2022-11-26 | Disposition: A | Discharge status: Home / Self Care | Payer: Medicare HMO | Source: Ambulatory Visit | Attending: Internal Medicine | Admitting: Internal Medicine

## 2022-11-26 DIAGNOSIS — Z1231 Encounter for screening mammogram for malignant neoplasm of breast: Secondary | ICD-10-CM | POA: Diagnosis not present

## 2022-11-26 NOTE — Telephone Encounter (Signed)
Referral placed.

## 2022-11-27 DIAGNOSIS — H401131 Primary open-angle glaucoma, bilateral, mild stage: Secondary | ICD-10-CM | POA: Diagnosis not present

## 2022-11-27 DIAGNOSIS — H35363 Drusen (degenerative) of macula, bilateral: Secondary | ICD-10-CM | POA: Diagnosis not present

## 2022-12-01 ENCOUNTER — Other Ambulatory Visit: Payer: Self-pay | Admitting: Internal Medicine

## 2022-12-01 DIAGNOSIS — R4589 Other symptoms and signs involving emotional state: Secondary | ICD-10-CM

## 2022-12-07 NOTE — Addendum Note (Signed)
Addended by: Gershon Crane A on: 12/07/2022 05:10 PM   Modules accepted: Orders

## 2022-12-09 ENCOUNTER — Telehealth: Payer: Self-pay | Admitting: Internal Medicine

## 2022-12-09 MED ORDER — LOSARTAN POTASSIUM 100 MG PO TABS: 100.0000 mg | ORAL_TABLET | Freq: Every day | ORAL | 1 refills | Status: DC

## 2022-12-09 NOTE — Telephone Encounter (Signed)
Prescription Request  12/09/2022  LOV: 09/21/2022 losartan (COZAAR) 100 MG tablet   Have you contacted your pharmacy to request a refill? Yes   Which pharmacy would you like this sent to?  [facility] - [place], [place] - [address] Phone: [phone]  Fax: [phone]     Patient notified that their request is being sent to the clinical staff for review and that they should receive a response within 2 business days.   Please advise at Mobile [phone] (mobile)

## 2022-12-09 NOTE — Telephone Encounter (Signed)
Refill sent.

## 2022-12-15 ENCOUNTER — Encounter: Payer: Self-pay | Admitting: Family Medicine

## 2022-12-17 ENCOUNTER — Other Ambulatory Visit: Payer: Medicare HMO

## 2022-12-21 ENCOUNTER — Ambulatory Visit (INDEPENDENT_AMBULATORY_CARE_PROVIDER_SITE_OTHER): Payer: Medicare HMO | Admitting: Internal Medicine

## 2022-12-21 ENCOUNTER — Encounter: Payer: Self-pay | Admitting: Internal Medicine

## 2022-12-21 ENCOUNTER — Other Ambulatory Visit: Payer: Self-pay | Admitting: *Deleted

## 2022-12-21 VITALS — BP 150/68 | HR 73 | Temp 98.2°F | Wt 156.4 lb

## 2022-12-21 DIAGNOSIS — I2510 Atherosclerotic heart disease of native coronary artery without angina pectoris: Secondary | ICD-10-CM

## 2022-12-21 DIAGNOSIS — I10 Essential (primary) hypertension: Secondary | ICD-10-CM | POA: Diagnosis not present

## 2022-12-21 DIAGNOSIS — N183 Chronic kidney disease, stage 3 unspecified: Secondary | ICD-10-CM

## 2022-12-21 DIAGNOSIS — E039 Hypothyroidism, unspecified: Secondary | ICD-10-CM | POA: Diagnosis not present

## 2022-12-21 DIAGNOSIS — E785 Hyperlipidemia, unspecified: Secondary | ICD-10-CM | POA: Diagnosis not present

## 2022-12-21 DIAGNOSIS — E1159 Type 2 diabetes mellitus with other circulatory complications: Secondary | ICD-10-CM | POA: Diagnosis not present

## 2022-12-21 LAB — POCT GLYCOSYLATED HEMOGLOBIN (HGB A1C): Hemoglobin A1C: 6.4 % — AB (ref 4.0–5.6)

## 2022-12-21 MED ORDER — LOSARTAN POTASSIUM 100 MG PO TABS: 100.0000 mg | ORAL_TABLET | Freq: Every day | ORAL | 1 refills | Status: DC

## 2022-12-21 NOTE — Assessment & Plan Note (Signed)
Uncontrolled likely due to lack of losartan.  She will resume and follow-up in 3 months.

## 2022-12-21 NOTE — Assessment & Plan Note (Signed)
Noted, stable, no chest pain or dyspnea on exertion.

## 2022-12-21 NOTE — Assessment & Plan Note (Signed)
On levothyroxine with a stable TSH at 1.660.

## 2022-12-21 NOTE — Assessment & Plan Note (Signed)
Recent LDL 70 on atorvastatin.

## 2022-12-21 NOTE — Progress Notes (Signed)
Established Patient Office Visit     CC/Reason for Visit: Follow-up chronic conditions  HPI: Tasha Garrett is a 72 y.o. female who is coming in today for the above mentioned reasons. Past Medical History is significant for: Hypertension, hyperlipidemia, type 2 diabetes, chronic kidney disease stage III, coronary artery disease, hypothyroidism.  She is feeling well without acute concerns or complaints.  States that the pharmacy has not delivered her losartan in over a month.  I can see where refills were sent from our end on November 27.   Past Medical/Surgical History: Past Medical History:  Diagnosis Date   Blood in stool    Chronic kidney disease    High cholesterol    Hypertension    Hypothyroid    Urinary tract infection     Past Surgical History:  Procedure Laterality Date   ABDOMINAL HYSTERECTOMY     AORTIC VALVE REPLACEMENT (AVR)/CORONARY ARTERY BYPASS GRAFTING (CABG)      Social History:  reports that she quit smoking about 12 years ago. Her smoking use included cigarettes. She started smoking about 32 years ago. She has a 10 pack-year smoking history. She has never used smokeless tobacco. She reports current alcohol use. She reports that she does not currently use drugs.  Allergies: No Known Allergies  Family History:  Family History  Problem Relation Age of Onset   ALS Mother      Current Outpatient Medications:    amLODipine (NORVASC) 10 MG tablet, TAKE 1 TABLET EVERY DAY, Disp: 90 tablet, Rfl: 1   ASPIRIN 81 PO, Take 1 tablet by mouth daily., Disp: , Rfl:    atorvastatin (LIPITOR) 40 MG tablet, Take 1 tablet (40 mg total) by mouth at bedtime., Disp: 90 tablet, Rfl: 1   brimonidine (ALPHAGAN) 0.2 % ophthalmic solution, Place 1 drop into both eyes 2 (two) times daily., Disp: , Rfl:    Cod Liver Oil CAPS, Take by mouth., Disp: , Rfl:    cyclobenzaprine (FLEXERIL) 5 MG tablet, Take 1 tablet (5 mg total) by mouth at bedtime as needed., Disp: 30 tablet, Rfl:  0   diclofenac Sodium (VOLTAREN) 1 % GEL, APPLY 2 GRAMS TOPICALLY 4 TIMES DAILY, Disp: 600 g, Rfl: 2   isosorbide mononitrate (IMDUR) 30 MG 24 hr tablet, Take 1 tablet (30 mg total) by mouth daily., Disp: 90 tablet, Rfl: 1   latanoprost (XALATAN) 0.005 % ophthalmic solution, 1 drop at bedtime., Disp: , Rfl:    levothyroxine (SYNTHROID) 137 MCG tablet, Take 1 tablet (137 mcg total) by mouth daily., Disp: 90 tablet, Rfl: 1   metoprolol tartrate (LOPRESSOR) 25 MG tablet, Take 1 tablet (25 mg total) by mouth 2 (two) times daily., Disp: 180 tablet, Rfl: 1   Multiple Vitamins-Minerals (MULTIVITAMIN ADULTS 50+ PO), Take 1 tablet by mouth daily., Disp: , Rfl:    SYMBICORT 160-4.5 MCG/ACT inhaler, INHALE 2 PUFFS INTO THE LUNGS IN THE MORNING AND AT BEDTIME., Disp: 3 each, Rfl: 3   losartan (COZAAR) 100 MG tablet, Take 1 tablet (100 mg total) by mouth daily., Disp: 90 tablet, Rfl: 1  Review of Systems:  Negative unless indicated in HPI.   Physical Exam: Vitals:   12/21/22 1500 12/21/22 1505  BP: (!) 160/74 (!) 150/68  Pulse: 73   Temp: 98.2 F (36.8 C)   TempSrc: Oral   SpO2: 96%   Weight: 156 lb 6.4 oz (70.9 kg)     Body mass index is 29.55 kg/m.   Physical Exam Vitals reviewed.  Constitutional:      Appearance: Normal appearance.  HENT:     Head: Normocephalic and atraumatic.  Eyes:     Conjunctiva/sclera: Conjunctivae normal.  Cardiovascular:     Rate and Rhythm: Normal rate and regular rhythm.  Pulmonary:     Effort: Pulmonary effort is normal.     Breath sounds: Normal breath sounds.  Skin:    General: Skin is warm and dry.  Neurological:     General: No focal deficit present.     Mental Status: She is alert and oriented to person, place, and time.  Psychiatric:        Mood and Affect: Mood normal.        Behavior: Behavior normal.        Thought Content: Thought content normal.        Judgment: Judgment normal.      Impression and Plan:  Type 2 diabetes mellitus  with other circulatory complication, without long-term current use of insulin (HCC) Assessment & Plan: Well-controlled on current with an A1c of 6.4 today.  Orders: -     POCT glycosylated hemoglobin (Hb A1C)  Stage 3 chronic kidney disease, unspecified whether stage 3a or 3b CKD (HCC) Assessment & Plan: Followed by nephrology.  Baseline creatinine around 1.2-1.4.   Coronary arteriosclerosis Assessment & Plan: Noted, stable, no chest pain or dyspnea on exertion.   Essential hypertension Assessment & Plan: Uncontrolled likely due to lack of losartan.  She will resume and follow-up in 3 months.   Hyperlipidemia with target LDL less than 70 Assessment & Plan: Recent LDL 70 on atorvastatin.   Acquired hypothyroidism Assessment & Plan: On levothyroxine with a stable TSH at 1.660.      Time spent:33 minutes reviewing chart, interviewing and examining patient and formulating plan of care.     Chaya Jan, MD [facility]

## 2022-12-21 NOTE — Assessment & Plan Note (Signed)
Well-controlled on current with an A1c of 6.4 today.

## 2022-12-21 NOTE — Assessment & Plan Note (Signed)
Followed by nephrology.  Baseline creatinine around 1.2-1.4.

## 2022-12-23 ENCOUNTER — Telehealth: Payer: Self-pay | Admitting: Internal Medicine

## 2022-12-23 NOTE — Telephone Encounter (Signed)
Pt says pharmacy will not fill her ex for losartan until January

## 2022-12-23 NOTE — Telephone Encounter (Signed)
error 

## 2022-12-24 NOTE — Telephone Encounter (Signed)
Patient has found her refill.

## 2022-12-24 NOTE — Telephone Encounter (Signed)
Spoke to daughter and explained that I called [facility] and a refill was sent 11/24/22 and the next refill will be 01/30/22.  Daughter will check with her mom and call us back.

## 2022-12-28 DIAGNOSIS — H524 Presbyopia: Secondary | ICD-10-CM | POA: Diagnosis not present

## 2022-12-29 ENCOUNTER — Ambulatory Visit (HOSPITAL_COMMUNITY): Admission: RE | Admit: 2022-12-29 | Payer: Medicare HMO | Source: Ambulatory Visit

## 2023-01-07 ENCOUNTER — Ambulatory Visit
Admission: RE | Admit: 2023-01-07 | Discharge: 2023-01-07 | Disposition: A | Discharge status: Home / Self Care | Payer: Medicare HMO | Source: Ambulatory Visit | Attending: Family Medicine | Admitting: Family Medicine

## 2023-01-07 DIAGNOSIS — M25562 Pain in left knee: Secondary | ICD-10-CM

## 2023-01-07 DIAGNOSIS — M25461 Effusion, right knee: Secondary | ICD-10-CM | POA: Diagnosis not present

## 2023-01-07 DIAGNOSIS — M23211 Derangement of anterior horn of medial meniscus due to old tear or injury, right knee: Secondary | ICD-10-CM | POA: Diagnosis not present

## 2023-01-07 DIAGNOSIS — M899 Disorder of bone, unspecified: Secondary | ICD-10-CM

## 2023-01-08 ENCOUNTER — Ambulatory Visit (HOSPITAL_COMMUNITY)
Admission: RE | Admit: 2023-01-08 | Discharge: 2023-01-08 | Disposition: A | Discharge status: Home / Self Care | Payer: Medicare HMO | Source: Ambulatory Visit | Attending: Acute Care | Admitting: Acute Care

## 2023-01-08 DIAGNOSIS — R918 Other nonspecific abnormal finding of lung field: Secondary | ICD-10-CM | POA: Diagnosis not present

## 2023-01-08 DIAGNOSIS — I700 Atherosclerosis of aorta: Secondary | ICD-10-CM | POA: Diagnosis not present

## 2023-01-08 DIAGNOSIS — Z122 Encounter for screening for malignant neoplasm of respiratory organs: Secondary | ICD-10-CM | POA: Insufficient documentation

## 2023-01-08 DIAGNOSIS — J439 Emphysema, unspecified: Secondary | ICD-10-CM | POA: Diagnosis not present

## 2023-01-08 DIAGNOSIS — Z87891 Personal history of nicotine dependence: Secondary | ICD-10-CM | POA: Diagnosis not present

## 2023-01-08 DIAGNOSIS — K7460 Unspecified cirrhosis of liver: Secondary | ICD-10-CM | POA: Diagnosis not present

## 2023-01-25 ENCOUNTER — Other Ambulatory Visit: Payer: Self-pay

## 2023-01-25 DIAGNOSIS — J4540 Moderate persistent asthma, uncomplicated: Secondary | ICD-10-CM

## 2023-01-25 DIAGNOSIS — Z87891 Personal history of nicotine dependence: Secondary | ICD-10-CM

## 2023-01-25 DIAGNOSIS — Z122 Encounter for screening for malignant neoplasm of respiratory organs: Secondary | ICD-10-CM

## 2023-01-28 ENCOUNTER — Other Ambulatory Visit: Payer: Self-pay | Admitting: Internal Medicine

## 2023-01-28 DIAGNOSIS — M25562 Pain in left knee: Secondary | ICD-10-CM

## 2023-01-28 DIAGNOSIS — M899 Disorder of bone, unspecified: Secondary | ICD-10-CM

## 2023-01-28 NOTE — Addendum Note (Signed)
Addended by: Gershon Crane A on: 01/28/2023 09:27 AM   Modules accepted: Orders

## 2023-02-03 ENCOUNTER — Ambulatory Visit: Payer: Medicare HMO | Admitting: Orthopaedic Surgery

## 2023-02-09 ENCOUNTER — Ambulatory Visit: Payer: Medicare HMO | Admitting: Vascular Surgery

## 2023-02-09 ENCOUNTER — Encounter: Payer: Self-pay | Admitting: Vascular Surgery

## 2023-02-09 ENCOUNTER — Ambulatory Visit (HOSPITAL_COMMUNITY)
Admission: RE | Admit: 2023-02-09 | Discharge: 2023-02-09 | Disposition: A | Discharge status: Home / Self Care | Payer: Medicare HMO | Source: Ambulatory Visit | Attending: Vascular Surgery | Admitting: Vascular Surgery

## 2023-02-09 VITALS — BP 171/79 | HR 67 | Temp 97.1°F | Resp 18 | Ht 61.0 in | Wt 154.0 lb

## 2023-02-09 DIAGNOSIS — I6522 Occlusion and stenosis of left carotid artery: Secondary | ICD-10-CM | POA: Diagnosis not present

## 2023-02-09 NOTE — Progress Notes (Signed)
Patient name: Tasha Garrett MRN: [id number] DOB: [birth year] Sex: female  REASON FOR CONSULT: 31-month follow-up, surveillance carotid disease  HPI: Tasha Garrett is a 73 y.o. female, with history of CKD, CAD s/p CABG, hypertension, hyperlipidemia that presents for 68-month follow-up for surveillance of her carotid disease. Patient denies any history of stroke or TIA.  No recent neurologic events.   Previously seen after she had a carotid duplex in [place], [place] showing greater than 70% left ICA stenosis.  Subsequent had a CTA at [facility] that showed a 58% stenosis of the left carotid bulb and 79 to 80% stenosis of the left ICA.  Duplex here only demonstrated 60 to 79% stenosis.   She is on aspirin and statin for risk reduction.  States she quit smoking after her heart attack.  Past Medical History:  Diagnosis Date   Blood in stool    Carotid artery occlusion    Chronic kidney disease    High cholesterol    Hypertension    Hypothyroid    Urinary tract infection     Past Surgical History:  Procedure Laterality Date   ABDOMINAL HYSTERECTOMY     AORTIC VALVE REPLACEMENT (AVR)/CORONARY ARTERY BYPASS GRAFTING (CABG)      Family History  Problem Relation Age of Onset   ALS Mother     SOCIAL HISTORY: Social History   Socioeconomic History   Marital status: Widowed    Spouse name: Not on file   Number of children: Not on file   Years of education: Not on file   Highest education level: 12th grade  Occupational History   Not on file  Tobacco Use   Smoking status: Former    Current packs/day: 0.00    Average packs/day: 0.5 packs/day for 20.0 years (10.0 ttl pk-yrs)    Types: Cigarettes    Start date: 04/1990    Quit date: 04/2010    Years since quitting: 12.8   Smokeless tobacco: Never   Tobacco comments:    Pt states she stopped smoking 10 years ago and at most smoked 1/2 ppd. ALS 10/12  Vaping Use   Vaping status: Never Used  Substance and Sexual Activity   Alcohol  use: Yes   Drug use: Not Currently   Sexual activity: Not Currently  Other Topics Concern   Not on file  Social History Narrative   Not on file   Social Drivers of Health   Financial Resource Strain: Patient Declined (11/17/2022)   Overall Financial Resource Strain (CARDIA)    Difficulty of Paying Living Expenses: Patient declined  Food Insecurity: Patient Declined (11/17/2022)   Hunger Vital Sign    Worried About Running Out of Food in the Last Year: Patient declined    Ran Out of Food in the Last Year: Patient declined  Transportation Needs: No Transportation Needs (11/17/2022)   PRAPARE - Administrator, Civil Service (Medical): No    Lack of Transportation (Non-Medical): No  Physical Activity: Insufficiently Active (11/17/2022)   Exercise Vital Sign    Days of Exercise per Week: 5 days    Minutes of Exercise per Session: 20 min  Stress: No Stress Concern Present (11/17/2022)   [employer] of Occupational Health - Occupational Stress Questionnaire    Feeling of Stress : Not at all  Social Connections: Unknown (11/17/2022)   Social Connection and Isolation Panel [NHANES]    Frequency of Communication with Friends and Family: More than three times a week    Frequency  of Social Gatherings with Friends and Family: Never    Attends Religious Services: Patient declined    Database administrator or Organizations: No    Attends Engineer, structural: More than 4 times per year    Marital Status: Widowed  Intimate Partner Violence: Not At Risk (05/19/2022)   Humiliation, Afraid, Rape, and Kick questionnaire    Fear of Current or Ex-Partner: No    Emotionally Abused: No    Physically Abused: No    Sexually Abused: No    No Known Allergies  Current Outpatient Medications  Medication Sig Dispense Refill   amLODipine (NORVASC) 10 MG tablet TAKE 1 TABLET EVERY DAY 90 tablet 1   ASPIRIN 81 PO Take 1 tablet by mouth daily.     atorvastatin (LIPITOR) 40 MG tablet  Take 1 tablet (40 mg total) by mouth at bedtime. 90 tablet 1   brimonidine (ALPHAGAN) 0.2 % ophthalmic solution Place 1 drop into both eyes 2 (two) times daily.     Cod Liver Oil CAPS Take by mouth.     cyclobenzaprine (FLEXERIL) 5 MG tablet Take 1 tablet (5 mg total) by mouth at bedtime as needed. 30 tablet 0   diclofenac Sodium (VOLTAREN) 1 % GEL APPLY 2 GRAMS TOPICALLY 4 TIMES DAILY 600 g 2   isosorbide mononitrate (IMDUR) 30 MG 24 hr tablet Take 1 tablet (30 mg total) by mouth daily. 90 tablet 1   latanoprost (XALATAN) 0.005 % ophthalmic solution 1 drop at bedtime.     levothyroxine (SYNTHROID) 137 MCG tablet Take 1 tablet (137 mcg total) by mouth daily. 90 tablet 1   losartan (COZAAR) 100 MG tablet Take 1 tablet (100 mg total) by mouth daily. 90 tablet 1   metoprolol tartrate (LOPRESSOR) 25 MG tablet Take 1 tablet (25 mg total) by mouth 2 (two) times daily. 180 tablet 1   Multiple Vitamins-Minerals (MULTIVITAMIN ADULTS 50+ PO) Take 1 tablet by mouth daily.     SYMBICORT 160-4.5 MCG/ACT inhaler INHALE 2 PUFFS INTO THE LUNGS IN THE MORNING AND AT BEDTIME. 3 each 3   No current facility-administered medications for this visit.    REVIEW OF SYSTEMS:  [X]  denotes positive finding, [ ]  denotes negative finding Cardiac  Comments:  Chest pain or chest pressure:    Shortness of breath upon exertion:    Short of breath when lying flat:    Irregular heart rhythm:        Vascular    Pain in calf, thigh, or hip brought on by ambulation:    Pain in feet at night that wakes you up from your sleep:     Blood clot in your veins:    Leg swelling:         Pulmonary    Oxygen at home:    Productive cough:     Wheezing:         Neurologic    Sudden weakness in arms or legs:     Sudden numbness in arms or legs:     Sudden onset of difficulty speaking or slurred speech:    Temporary loss of vision in one eye:     Problems with dizziness:         Gastrointestinal    Blood in stool:      Vomited blood:         Genitourinary    Burning when urinating:     Blood in urine:        Psychiatric  Major depression:         Hematologic    Bleeding problems:    Problems with blood clotting too easily:        Skin    Rashes or ulcers:        Constitutional    Fever or chills:      PHYSICAL EXAM: Vitals:   02/09/23 1542 02/09/23 1544  BP: (!) 166/64 (!) 171/79  Pulse: 67   Resp: 18   Temp: (!) 97.1 F (36.2 C)   TempSrc: Temporal   SpO2: 98%   Weight: 154 lb (69.9 kg)   Height: 5\' 1"  (1.549 m)     GENERAL: The patient is a well-nourished female, in no acute distress. The vital signs are documented above. CARDIAC: There is a regular rate and rhythm.  VASCULAR:  PULMONARY: No respiratory distress. ABDOMEN: Soft and non-tender. MUSCULOSKELETAL: There are no major deformities or cyanosis. NEUROLOGIC: No focal weakness or paresthesias are detected.  CN II-XII grossly intact. SKIN: There are no ulcers or rashes noted. PSYCHIATRIC: The patient has a normal affect.  DATA:   Carotid duplex today shows 1 to 39% right ICA stenosis and 60 to 79% left ICA stenosis with velocity of 357/82 (previously 246/75)  Assessment/Plan:  73 y.o. female, with history of CKD, CAD s/p CABG, hypertension, hyperlipidemia that presents for 6 month follow-up for surveillance of her carotid stenosis.  Previously seen after she had a carotid duplex in [place], [place] showing greater than 70% left ICA stenosis.  Subsequent had a CTA at [facility] that showed a 58% stenosis of the left carotid bulb and 79 to 80% stenosis of the left ICA (per report, I do not have images).  Duplex here in the past only showed 60-79% stenosis.  On 50-month follow-up today she has had a significant increase in the peak systolic velocity in her left ICA now almost 400.  I have recommended a CTA neck for further evaluation.  Discussed the options of carotid endarterectomy versus TCAR with stenting using flow  reversal.  Discussed for asymptomatic disease would recommend surgical intervention when its greater than 80%.  I will have her follow-up with me once CTA is complete to discuss stenosis and indication for intervention and what would be the best option.   Cephus Shelling, MD [facility] Office: [phone]

## 2023-02-10 ENCOUNTER — Ambulatory Visit (HOSPITAL_BASED_OUTPATIENT_CLINIC_OR_DEPARTMENT_OTHER): Payer: Medicare HMO | Admitting: Student

## 2023-02-10 DIAGNOSIS — M175 Other unilateral secondary osteoarthritis of knee: Secondary | ICD-10-CM | POA: Diagnosis not present

## 2023-02-10 NOTE — Progress Notes (Signed)
Chief Complaint: Left knee pain     History of Present Illness:    Tasha Garrett is a 73 y.o. female today for evaluation of chronic left knee pain.  Her symptoms began about 3 to 4 years ago without any specific injury or precipitating event.  She does recall a fall while at the beach in 2012 and may have sustained an injury to her knee at that point.  Pain is now consistent and is located on both sides of the knee, worse laterally.  She has had multiple cortisone injections within the last few years and only reports getting a few days of relief.  Denies any other treatment options at this point.  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   Surgical History:   None  PMH/PSH/Family History/Social History/Meds/Allergies:    Past Medical History:  Diagnosis Date   Blood in stool    Carotid artery occlusion    Chronic kidney disease    High cholesterol    Hypertension    Hypothyroid    Urinary tract infection    Past Surgical History:  Procedure Laterality Date   ABDOMINAL HYSTERECTOMY     AORTIC VALVE REPLACEMENT (AVR)/CORONARY ARTERY BYPASS GRAFTING (CABG)     Social History   Socioeconomic History   Marital status: Widowed    Spouse name: Not on file   Number of children: Not on file   Years of education: Not on file   Highest education level: 12th grade  Occupational History   Not on file  Tobacco Use   Smoking status: Former    Current packs/day: 0.00    Average packs/day: 0.5 packs/day for 20.0 years (10.0 ttl pk-yrs)    Types: Cigarettes    Start date: 04/1990    Quit date: 04/2010    Years since quitting: 12.8   Smokeless tobacco: Never   Tobacco comments:    Pt states she stopped smoking 10 years ago and at most smoked 1/2 ppd. ALS 10/12  Vaping Use   Vaping status: Never Used  Substance and Sexual Activity   Alcohol use: Yes   Drug use: Not Currently   Sexual  activity: Not Currently  Other Topics Concern   Not on file  Social History Narrative   Not on file   Social Drivers of Health   Financial Resource Strain: Patient Declined (11/17/2022)   Overall Financial Resource Strain (CARDIA)    Difficulty of Paying Living Expenses: Patient declined  Food Insecurity: Patient Declined (11/17/2022)   Hunger Vital Sign    Worried About Running Out of Food in the Last Year: Patient declined    Ran Out of Food in the Last Year: Patient declined  Transportation Needs: No Transportation Needs (11/17/2022)   PRAPARE - Administrator, Civil Service (Medical): No    Lack of Transportation (Non-Medical): No  Physical Activity: Insufficiently Active (11/17/2022)   Exercise Vital Sign    Days of Exercise per Week: 5 days    Minutes of Exercise per Session: 20 min  Stress: No Stress Concern Present (11/17/2022)   [employer] of Occupational Health - Occupational Stress Questionnaire    Feeling of Stress : Not at all  Social Connections: Unknown (11/17/2022)   Social Connection and Isolation Panel [  NHANES]    Frequency of Communication with Friends and Family: More than three times a week    Frequency of Social Gatherings with Friends and Family: Never    Attends Religious Services: Patient declined    Database administrator or Organizations: No    Attends Engineer, structural: More than 4 times per year    Marital Status: Widowed   Family History  Problem Relation Age of Onset   ALS Mother    No Known Allergies Current Outpatient Medications  Medication Sig Dispense Refill   amLODipine (NORVASC) 10 MG tablet TAKE 1 TABLET EVERY DAY 90 tablet 1   ASPIRIN 81 PO Take 1 tablet by mouth daily.     atorvastatin (LIPITOR) 40 MG tablet Take 1 tablet (40 mg total) by mouth at bedtime. 90 tablet 1   brimonidine (ALPHAGAN) 0.2 % ophthalmic solution Place 1 drop into both eyes 2 (two) times daily.     Cod Liver Oil CAPS Take by mouth.      cyclobenzaprine (FLEXERIL) 5 MG tablet Take 1 tablet (5 mg total) by mouth at bedtime as needed. 30 tablet 0   diclofenac Sodium (VOLTAREN) 1 % GEL APPLY 2 GRAMS TOPICALLY 4 TIMES DAILY 600 g 2   isosorbide mononitrate (IMDUR) 30 MG 24 hr tablet Take 1 tablet (30 mg total) by mouth daily. 90 tablet 1   latanoprost (XALATAN) 0.005 % ophthalmic solution 1 drop at bedtime.     levothyroxine (SYNTHROID) 137 MCG tablet Take 1 tablet (137 mcg total) by mouth daily. 90 tablet 1   losartan (COZAAR) 100 MG tablet Take 1 tablet (100 mg total) by mouth daily. 90 tablet 1   metoprolol tartrate (LOPRESSOR) 25 MG tablet Take 1 tablet (25 mg total) by mouth 2 (two) times daily. 180 tablet 1   Multiple Vitamins-Minerals (MULTIVITAMIN ADULTS 50+ PO) Take 1 tablet by mouth daily.     SYMBICORT 160-4.5 MCG/ACT inhaler INHALE 2 PUFFS INTO THE LUNGS IN THE MORNING AND AT BEDTIME. 3 each 3   No current facility-administered medications for this visit.   VAS US CAROTID Result Date: 02/09/2023 Carotid Arterial Duplex Study Patient Name:  Tasha Garrett  Date of Exam:   02/09/2023 Medical Rec #: [id number]    Accession #:    [id number] Date of Birth: 24-Apr-1950    Patient Gender: F Patient Age:   4 years Exam Location:  [facility] Procedure:      VAS US CAROTID Referring Phys: Sherald Hess --------------------------------------------------------------------------------  Indications:                            Carotid artery disease. Risk Factors:                           Hypertension, hyperlipidemia, Diabetes,                                         coronary artery disease. Comparison Study:                       No significant change since prior exam  of 08/11/2022 Pre-Surgical Evaluation & Surgical      Stenosis at left ICA only. ICA is Correlation                             normal past the stenosis. Anatomy on                                         the left is  within normal limits.Left                                         bifurcation is located near the                                         mandible. Performing Technologist: Elita Quick RVT  Examination Guidelines: A complete evaluation includes B-mode imaging, spectral Doppler, color Doppler, and power Doppler as needed of all accessible portions of each vessel. Bilateral testing is considered an integral part of a complete examination. Limited examinations for reoccurring indications may be performed as noted.  Right Carotid Findings: +----------+--------+--------+--------+------------------+--------+           PSV cm/sEDV cm/sStenosisPlaque DescriptionComments +----------+--------+--------+--------+------------------+--------+ CCA Prox  103     17                                         +----------+--------+--------+--------+------------------+--------+ CCA Mid   69      15                                         +----------+--------+--------+--------+------------------+--------+ CCA Distal60      16              heterogenous               +----------+--------+--------+--------+------------------+--------+ ICA Prox  99      33      1-39%   heterogenous               +----------+--------+--------+--------+------------------+--------+ ICA Mid   96      21                                         +----------+--------+--------+--------+------------------+--------+ ICA Distal48      15                                         +----------+--------+--------+--------+------------------+--------+ ECA       157     14                                         +----------+--------+--------+--------+------------------+--------+ +----------+--------+-------+----------------+-------------------+           PSV cm/sEDV cmsDescribe  Arm Pressure (mmHG) +----------+--------+-------+----------------+-------------------+ [id number]     4      Multiphasic, [id number]                  +----------+--------+-------+----------------+-------------------+ +---------+--------+--+--------+--+---------+ VertebralPSV cm/s56EDV cm/s16Antegrade +---------+--------+--+--------+--+---------+  Left Carotid Findings: +----------+--------+--------+--------+-------------------------+--------+           PSV cm/sEDV cm/sStenosisPlaque Description       Comments +----------+--------+--------+--------+-------------------------+--------+ CCA Prox  127     12                                                +----------+--------+--------+--------+-------------------------+--------+ CCA Mid   76      17                                                +----------+--------+--------+--------+-------------------------+--------+ CCA Distal55      15              calcific and heterogenous         +----------+--------+--------+--------+-------------------------+--------+ ICA Prox  357     82      60-79%  calcific and heterogenous         +----------+--------+--------+--------+-------------------------+--------+ ICA Mid   151     32                                                +----------+--------+--------+--------+-------------------------+--------+ ICA Distal92      22                                                +----------+--------+--------+--------+-------------------------+--------+ ECA       294     50      >50%                                      +----------+--------+--------+--------+-------------------------+--------+ +----------+--------+--------+----------------+-------------------+           PSV cm/sEDV cm/sDescribe        Arm Pressure (mmHG) +----------+--------+--------+----------------+-------------------+ Subclavian140     16      Multiphasic, [id number]                 +----------+--------+--------+----------------+-------------------+ +---------+--------+--+--------+--+---------+ VertebralPSV cm/s45EDV  cm/s14Antegrade +---------+--------+--+--------+--+---------+   Summary: Right Carotid: Velocities in the right ICA are consistent with a 1-39% stenosis. Left Carotid: Velocities in the left ICA are consistent with a 60-79% stenosis.               The ECA appears >50% stenosed. Vertebrals:  Bilateral vertebral arteries demonstrate antegrade flow. Subclavians: Normal flow hemodynamics were seen in bilateral subclavian              arteries. *See table(s) above for measurements and observations.  Electronically signed by Sherald Hess MD on 02/09/2023 at 4:27:22 PM.    Final     Review of Systems:   A ROS was performed including pertinent positives and negatives as documented in the  HPI.  Physical Exam :   Constitutional: NAD and appears stated age Neurological: Alert and oriented Psych: Appropriate affect and cooperative There were no vitals taken for this visit.   Comprehensive Musculoskeletal Exam:    Left knee exam demonstrates active range of motion from 0 to 100 degrees with palpable crepitus.  Tenderness with palpation over the lateral joint line.  Stable collaterals with varus valgus stress.  Mild effusion present.  Imaging:   MRI (left knee): Tear in the anterior root and extrusion of the lateral meniscus. Tricompartmental degenerative changes most notable in the lateral compartment.  Joint effusion present.   I personally reviewed and interpreted the radiographs.   Assessment:   73 y.o. female with chronic left knee pain over the last 3 to 4 years.  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  She has failed previous treatment with cortisone injections as these only provided a few days of relief.  There are arthritic changes noted in all 3 compartments and therefore I believe she is likely a candidate for a total knee replacement so I will  have her referred over to Dr. Magnus Ivan to discuss this further.  Patient is agreeable to plan.  Plan :    - Referral to Dr. Magnus Ivan for discussion of total knee replacement     I personally saw and evaluated the patient, and participated in the management and treatment plan.  Hazle Nordmann, PA-C Orthopedics

## 2023-02-11 DIAGNOSIS — I10 Essential (primary) hypertension: Secondary | ICD-10-CM | POA: Diagnosis not present

## 2023-02-11 DIAGNOSIS — D649 Anemia, unspecified: Secondary | ICD-10-CM | POA: Diagnosis not present

## 2023-02-11 DIAGNOSIS — N1831 Chronic kidney disease, stage 3a: Secondary | ICD-10-CM | POA: Diagnosis not present

## 2023-02-11 LAB — BASIC METABOLIC PANEL WITH GFR
CO2: 26 — AB (ref 13–22)
Chloride: 110 — AB (ref 99–108)
Creatinine: 1.5 — AB (ref 0.5–1.1)
Glucose: 127
Potassium: 5 meq/L (ref 3.5–5.1)
Sodium: 145 (ref 137–147)

## 2023-02-11 LAB — CBC AND DIFFERENTIAL: Hemoglobin: 11.4 — AB (ref 12.0–16.0)

## 2023-02-11 LAB — COMPREHENSIVE METABOLIC PANEL WITH GFR
Albumin: 4.5 (ref 3.5–5.0)
Calcium: 10.8 — AB (ref 8.7–10.7)

## 2023-02-15 DIAGNOSIS — N2581 Secondary hyperparathyroidism of renal origin: Secondary | ICD-10-CM | POA: Diagnosis not present

## 2023-02-15 DIAGNOSIS — E875 Hyperkalemia: Secondary | ICD-10-CM | POA: Diagnosis not present

## 2023-02-15 DIAGNOSIS — N1831 Chronic kidney disease, stage 3a: Secondary | ICD-10-CM | POA: Diagnosis not present

## 2023-02-17 ENCOUNTER — Other Ambulatory Visit: Payer: Self-pay

## 2023-02-17 DIAGNOSIS — I6522 Occlusion and stenosis of left carotid artery: Secondary | ICD-10-CM

## 2023-02-26 ENCOUNTER — Ambulatory Visit
Admission: RE | Admit: 2023-02-26 | Discharge: 2023-02-26 | Disposition: A | Discharge status: Home / Self Care | Payer: Medicare HMO | Source: Ambulatory Visit | Attending: Vascular Surgery | Admitting: Vascular Surgery

## 2023-02-26 DIAGNOSIS — I6522 Occlusion and stenosis of left carotid artery: Secondary | ICD-10-CM | POA: Diagnosis not present

## 2023-02-26 DIAGNOSIS — M47812 Spondylosis without myelopathy or radiculopathy, cervical region: Secondary | ICD-10-CM | POA: Diagnosis not present

## 2023-02-26 MED ORDER — IOPAMIDOL (ISOVUE-370) INJECTION 76%
100.0000 mL | Freq: Once | INTRAVENOUS | Status: AC | PRN
  Administered 2023-02-26: 75 mL via INTRAVENOUS

## 2023-03-03 ENCOUNTER — Ambulatory Visit: Payer: Medicare HMO | Admitting: Orthopaedic Surgery

## 2023-03-09 ENCOUNTER — Encounter: Payer: Self-pay | Admitting: Vascular Surgery

## 2023-03-09 ENCOUNTER — Ambulatory Visit: Payer: Medicare HMO | Admitting: Vascular Surgery

## 2023-03-09 VITALS — BP 145/75 | HR 60 | Temp 97.7°F | Resp 18 | Ht 61.0 in | Wt 155.9 lb

## 2023-03-09 DIAGNOSIS — I6522 Occlusion and stenosis of left carotid artery: Secondary | ICD-10-CM

## 2023-03-09 NOTE — Progress Notes (Signed)
 Patient name: Tasha Garrett MRN: [id number] DOB: 1950-02-14 Sex: female  REASON FOR CONSULT: Follow-up after CTA neck for evaluation of carotid artery disease  HPI: Tasha Garrett is a 73 y.o. female, with history of CKD, CAD s/p CABG, hypertension, hyperlipidemia that presents for follow-up after CTA neck for evaluation of her carotid artery disease.  Previously seen after she had a carotid duplex in [place], [place] showing greater than 70% left ICA stenosis.    Most recent ultrasound showed a borderline high-grade left carotid stenosis and I sent her for CTA neck.   She is on aspirin and statin for risk reduction.  States she quit smoking after her heart attack.  Past Medical History:  Diagnosis Date   Blood in stool    Carotid artery occlusion    Chronic kidney disease    High cholesterol    Hypertension    Hypothyroid    Urinary tract infection     Past Surgical History:  Procedure Laterality Date   ABDOMINAL HYSTERECTOMY     AORTIC VALVE REPLACEMENT (AVR)/CORONARY ARTERY BYPASS GRAFTING (CABG)      Family History  Problem Relation Age of Onset   ALS Mother     SOCIAL HISTORY: Social History   Socioeconomic History   Marital status: Widowed    Spouse name: Not on file   Number of children: Not on file   Years of education: Not on file   Highest education level: 12th grade  Occupational History   Not on file  Tobacco Use   Smoking status: Former    Current packs/day: 0.00    Average packs/day: 0.5 packs/day for 20.0 years (10.0 ttl pk-yrs)    Types: Cigarettes    Start date: 04/1990    Quit date: 04/2010    Years since quitting: 12.9   Smokeless tobacco: Never   Tobacco comments:    Pt states she stopped smoking 10 years ago and at most smoked 1/2 ppd. ALS 10/12  Vaping Use   Vaping status: Never Used  Substance and Sexual Activity   Alcohol use: Yes   Drug use: Not Currently   Sexual activity: Not Currently  Other Topics Concern   Not on file  Social  History Narrative   Not on file   Social Drivers of Health   Financial Resource Strain: Patient Declined (11/17/2022)   Overall Financial Resource Strain (CARDIA)    Difficulty of Paying Living Expenses: Patient declined  Food Insecurity: Patient Declined (11/17/2022)   Hunger Vital Sign    Worried About Running Out of Food in the Last Year: Patient declined    Ran Out of Food in the Last Year: Patient declined  Transportation Needs: No Transportation Needs (11/17/2022)   PRAPARE - Administrator, Civil Service (Medical): No    Lack of Transportation (Non-Medical): No  Physical Activity: Insufficiently Active (11/17/2022)   Exercise Vital Sign    Days of Exercise per Week: 5 days    Minutes of Exercise per Session: 20 min  Stress: No Stress Concern Present (11/17/2022)   [employer] of Occupational Health - Occupational Stress Questionnaire    Feeling of Stress : Not at all  Social Connections: Unknown (11/17/2022)   Social Connection and Isolation Panel [NHANES]    Frequency of Communication with Friends and Family: More than three times a week    Frequency of Social Gatherings with Friends and Family: Never    Attends Religious Services: Patient declined    Active Member  of Clubs or Organizations: No    Attends Banker Meetings: More than 4 times per year    Marital Status: Widowed  Intimate Partner Violence: Not At Risk (05/19/2022)   Humiliation, Afraid, Rape, and Kick questionnaire    Fear of Current or Ex-Partner: No    Emotionally Abused: No    Physically Abused: No    Sexually Abused: No    No Known Allergies  Current Outpatient Medications  Medication Sig Dispense Refill   amLODipine (NORVASC) 10 MG tablet TAKE 1 TABLET EVERY DAY 90 tablet 1   ASPIRIN 81 PO Take 1 tablet by mouth daily.     atorvastatin (LIPITOR) 40 MG tablet Take 1 tablet (40 mg total) by mouth at bedtime. 90 tablet 1   brimonidine (ALPHAGAN) 0.2 % ophthalmic solution  Place 1 drop into both eyes 2 (two) times daily.     Cod Liver Oil CAPS Take by mouth.     cyclobenzaprine (FLEXERIL) 5 MG tablet Take 1 tablet (5 mg total) by mouth at bedtime as needed. 30 tablet 0   diclofenac Sodium (VOLTAREN) 1 % GEL APPLY 2 GRAMS TOPICALLY 4 TIMES DAILY 600 g 2   isosorbide mononitrate (IMDUR) 30 MG 24 hr tablet Take 1 tablet (30 mg total) by mouth daily. 90 tablet 1   latanoprost (XALATAN) 0.005 % ophthalmic solution 1 drop at bedtime.     levothyroxine (SYNTHROID) 137 MCG tablet Take 1 tablet (137 mcg total) by mouth daily. 90 tablet 1   losartan (COZAAR) 100 MG tablet Take 1 tablet (100 mg total) by mouth daily. 90 tablet 1   metoprolol tartrate (LOPRESSOR) 25 MG tablet Take 1 tablet (25 mg total) by mouth 2 (two) times daily. 180 tablet 1   Multiple Vitamins-Minerals (MULTIVITAMIN ADULTS 50+ PO) Take 1 tablet by mouth daily.     SYMBICORT 160-4.5 MCG/ACT inhaler INHALE 2 PUFFS INTO THE LUNGS IN THE MORNING AND AT BEDTIME. 3 each 3   No current facility-administered medications for this visit.    REVIEW OF SYSTEMS:  [X]  denotes positive finding, [ ]  denotes negative finding Cardiac  Comments:  Chest pain or chest pressure:    Shortness of breath upon exertion:    Short of breath when lying flat:    Irregular heart rhythm:        Vascular    Pain in calf, thigh, or hip brought on by ambulation:    Pain in feet at night that wakes you up from your sleep:     Blood clot in your veins:    Leg swelling:         Pulmonary    Oxygen at home:    Productive cough:     Wheezing:         Neurologic    Sudden weakness in arms or legs:     Sudden numbness in arms or legs:     Sudden onset of difficulty speaking or slurred speech:    Temporary loss of vision in one eye:     Problems with dizziness:         Gastrointestinal    Blood in stool:     Vomited blood:         Genitourinary    Burning when urinating:     Blood in urine:        Psychiatric    Major  depression:         Hematologic    Bleeding problems:  Problems with blood clotting too easily:        Skin    Rashes or ulcers:        Constitutional    Fever or chills:      PHYSICAL EXAM: Vitals:   03/09/23 1354 03/09/23 1356  BP: (!) 160/87 (!) 145/75  Pulse: 60   Resp: 18   Temp: 97.7 F (36.5 C)   TempSrc: Temporal   SpO2: 95%   Weight: 155 lb 14.4 oz (70.7 kg)   Height: 5\' 1"  (1.549 m)     GENERAL: The patient is a well-nourished female, in no acute distress. The vital signs are documented above. CARDIAC: There is a regular rate and rhythm.  VASCULAR:  PULMONARY: No respiratory distress. ABDOMEN: Soft and non-tender. MUSCULOSKELETAL: There are no major deformities or cyanosis. NEUROLOGIC: No focal weakness or paresthesias are detected.  CN II-XII grossly intact. SKIN: There are no ulcers or rashes noted. PSYCHIATRIC: The patient has a normal affect.  DATA:   CTA neck 02/26/2023 shows a high-grade over 80% calcified left internal carotid stenosis  Carotid duplex today shows 1 to 39% right ICA stenosis and 60 to 79% left ICA stenosis with velocity of 357/82 (previously 246/75)  Assessment/Plan:  73 y.o. female, with history of CKD, CAD s/p CABG, hypertension, hyperlipidemia that presents for follow-up after CTA neck for evaluation of carotid artery disease.  On recent 29-month follow-up noted to have increasing velocity in the left ICA near 400 with borderline high grade left carotid stenosis.  I sent her for CTA neck that confirms a high-grade over 80% left carotid stenosis.  I have recommended carotid revascularization for stroke risk reduction.  I discussed TCAR versus carotid endarterectomy.  I discussed both these carry approximate 1% stroke risk.  We discussed both procedures in detail.  Ultimately also discussed medical therapy.  She wants to think about her options and I will set up a phone visit in several weeks when she has had more time to discuss things  with family.  Discussed if she pursues medical therapy or carotid stenting this would require dual antiplatelet therapy.   Cephus Shelling, MD [facility] Office: [phone]

## 2023-03-10 DIAGNOSIS — N1831 Chronic kidney disease, stage 3a: Secondary | ICD-10-CM | POA: Diagnosis not present

## 2023-03-22 ENCOUNTER — Ambulatory Visit (INDEPENDENT_AMBULATORY_CARE_PROVIDER_SITE_OTHER): Payer: Medicare HMO | Admitting: Internal Medicine

## 2023-03-22 ENCOUNTER — Encounter: Payer: Self-pay | Admitting: Internal Medicine

## 2023-03-22 VITALS — BP 128/70 | HR 63 | Temp 97.6°F | Wt 156.0 lb

## 2023-03-22 DIAGNOSIS — E1129 Type 2 diabetes mellitus with other diabetic kidney complication: Secondary | ICD-10-CM

## 2023-03-22 DIAGNOSIS — N183 Chronic kidney disease, stage 3 unspecified: Secondary | ICD-10-CM | POA: Diagnosis not present

## 2023-03-22 DIAGNOSIS — E785 Hyperlipidemia, unspecified: Secondary | ICD-10-CM

## 2023-03-22 DIAGNOSIS — E039 Hypothyroidism, unspecified: Secondary | ICD-10-CM

## 2023-03-22 DIAGNOSIS — R809 Proteinuria, unspecified: Secondary | ICD-10-CM | POA: Diagnosis not present

## 2023-03-22 DIAGNOSIS — I10 Essential (primary) hypertension: Secondary | ICD-10-CM | POA: Diagnosis not present

## 2023-03-22 DIAGNOSIS — I6522 Occlusion and stenosis of left carotid artery: Secondary | ICD-10-CM | POA: Diagnosis not present

## 2023-03-22 DIAGNOSIS — E1159 Type 2 diabetes mellitus with other circulatory complications: Secondary | ICD-10-CM | POA: Diagnosis not present

## 2023-03-22 LAB — POCT GLYCOSYLATED HEMOGLOBIN (HGB A1C): Hemoglobin A1C: 6.6 % — AB (ref 4.0–5.6)

## 2023-03-22 NOTE — Assessment & Plan Note (Signed)
 Well-controlled on present.  On recent visit with nephrology her losartan dose was cut in half and her metoprolol dose was doubled.

## 2023-03-22 NOTE — Progress Notes (Signed)
 Established Patient Office Visit     CC/Reason for Visit: Follow-up chronic medical conditions  HPI: Tasha Garrett is a 73 y.o. female who is coming in today for the above mentioned reasons. Past Medical History is significant for: Hypertension, hyperlipidemia, type 2 diabetes, stage III chronic kidney disease followed by nephrology, hypothyroidism, coronary artery disease.  She was recently diagnosed with high-grade left ICA stenosis, is being followed by vascular and carotid revascularization has been advised.  She has also been dealing with knee pain and a total knee replacement has been recommended.  She is feeling well.   Past Medical/Surgical History: Past Medical History:  Diagnosis Date   Blood in stool    Carotid artery occlusion    Chronic kidney disease    High cholesterol    Hypertension    Hypothyroid    Urinary tract infection     Past Surgical History:  Procedure Laterality Date   ABDOMINAL HYSTERECTOMY     AORTIC VALVE REPLACEMENT (AVR)/CORONARY ARTERY BYPASS GRAFTING (CABG)      Social History:  reports that she quit smoking about 12 years ago. Her smoking use included cigarettes. She started smoking about 32 years ago. She has a 10 pack-year smoking history. She has never used smokeless tobacco. She reports current alcohol use. She reports that she does not currently use drugs.  Allergies: No Known Allergies  Family History:  Family History  Problem Relation Age of Onset   ALS Mother      Current Outpatient Medications:    amLODipine (NORVASC) 10 MG tablet, TAKE 1 TABLET EVERY DAY, Disp: 90 tablet, Rfl: 1   ASPIRIN 81 PO, Take 1 tablet by mouth daily., Disp: , Rfl:    atorvastatin (LIPITOR) 40 MG tablet, Take 1 tablet (40 mg total) by mouth at bedtime., Disp: 90 tablet, Rfl: 1   brimonidine (ALPHAGAN) 0.2 % ophthalmic solution, Place 1 drop into both eyes 2 (two) times daily., Disp: , Rfl:    Cod Liver Oil CAPS, Take by mouth., Disp: , Rfl:     cyclobenzaprine (FLEXERIL) 5 MG tablet, Take 1 tablet (5 mg total) by mouth at bedtime as needed., Disp: 30 tablet, Rfl: 0   diclofenac Sodium (VOLTAREN) 1 % GEL, APPLY 2 GRAMS TOPICALLY 4 TIMES DAILY, Disp: 600 g, Rfl: 2   isosorbide mononitrate (IMDUR) 30 MG 24 hr tablet, Take 1 tablet (30 mg total) by mouth daily., Disp: 90 tablet, Rfl: 1   latanoprost (XALATAN) 0.005 % ophthalmic solution, 1 drop at bedtime., Disp: , Rfl:    levothyroxine (SYNTHROID) 137 MCG tablet, Take 1 tablet (137 mcg total) by mouth daily., Disp: 90 tablet, Rfl: 1   losartan (COZAAR) 100 MG tablet, Take 1 tablet (100 mg total) by mouth daily., Disp: 90 tablet, Rfl: 1   metoprolol tartrate (LOPRESSOR) 25 MG tablet, Take 1 tablet (25 mg total) by mouth 2 (two) times daily., Disp: 180 tablet, Rfl: 1   Multiple Vitamins-Minerals (MULTIVITAMIN ADULTS 50+ PO), Take 1 tablet by mouth daily., Disp: , Rfl:    SYMBICORT 160-4.5 MCG/ACT inhaler, INHALE 2 PUFFS INTO THE LUNGS IN THE MORNING AND AT BEDTIME., Disp: 3 each, Rfl: 3  Review of Systems:  Negative unless indicated in HPI.   Physical Exam: Vitals:   03/22/23 1523 03/22/23 1527  BP: (!) 160/70 128/70  Pulse: 63   Temp: 97.6 F (36.4 C)   TempSrc: Oral   SpO2: 95%   Weight: 156 lb (70.8 kg)  Body mass index is 29.48 kg/m.   Physical Exam Vitals reviewed.  Constitutional:      Appearance: Normal appearance.  HENT:     Head: Normocephalic and atraumatic.  Eyes:     Conjunctiva/sclera: Conjunctivae normal.  Cardiovascular:     Rate and Rhythm: Normal rate and regular rhythm.  Pulmonary:     Effort: Pulmonary effort is normal.     Breath sounds: Normal breath sounds.  Skin:    General: Skin is warm and dry.  Neurological:     General: No focal deficit present.     Mental Status: She is alert and oriented to person, place, and time.  Psychiatric:        Mood and Affect: Mood normal.        Behavior: Behavior normal.        Thought Content: Thought  content normal.        Judgment: Judgment normal.      Impression and Plan:  Type 2 diabetes mellitus with other circulatory complication, without long-term current use of insulin (HCC) Assessment & Plan: Well-controlled on current with an A1c of 6.6 today.  Orders: -     POCT glycosylated hemoglobin (Hb A1C)  Essential hypertension Assessment & Plan: Well-controlled on present.  On recent visit with nephrology her losartan dose was cut in half and her metoprolol dose was doubled.   Stage 3 chronic kidney disease, unspecified whether stage 3a or 3b CKD (HCC) Assessment & Plan: Followed by nephrology.  Baseline creatinine around 1.2-1.4.   Stenosis of left carotid artery Assessment & Plan: Being followed by vascular surgery.  CTA of the neck with an 80% high-grade left ICA stenosis.  Surgeon has recommended carotid revascularization.  Patient has appointment tomorrow to discuss.   Acquired hypothyroidism Assessment & Plan: On levothyroxine with a stable TSH at 1.660.   Microalbuminuria due to type 2 diabetes mellitus [facility]) Assessment & Plan: On losartan.   Hyperlipidemia with target LDL less than 70 Assessment & Plan: Recent LDL 70 on atorvastatin.      Time spent:31 minutes reviewing chart, interviewing and examining patient and formulating plan of care.     Chaya Jan, MD [facility]

## 2023-03-22 NOTE — Assessment & Plan Note (Signed)
 Well-controlled on current with an A1c of 6.6 today.

## 2023-03-22 NOTE — Assessment & Plan Note (Signed)
Followed by nephrology.  Baseline creatinine around 1.2-1.4.

## 2023-03-22 NOTE — Assessment & Plan Note (Signed)
 On levothyroxine with a stable TSH at 1.660.

## 2023-03-22 NOTE — Assessment & Plan Note (Signed)
 On losartan

## 2023-03-22 NOTE — Assessment & Plan Note (Signed)
 Being followed by vascular surgery.  CTA of the neck with an 80% high-grade left ICA stenosis.  Surgeon has recommended carotid revascularization.  Patient has appointment tomorrow to discuss.

## 2023-03-22 NOTE — Assessment & Plan Note (Signed)
Recent LDL 70 on atorvastatin.

## 2023-03-23 ENCOUNTER — Ambulatory Visit: Payer: Medicare HMO | Admitting: Vascular Surgery

## 2023-03-23 DIAGNOSIS — I6522 Occlusion and stenosis of left carotid artery: Secondary | ICD-10-CM

## 2023-03-23 MED ORDER — CLOPIDOGREL BISULFATE 75 MG PO TABS: 75.0000 mg | ORAL_TABLET | Freq: Every day | ORAL | 6 refills | Status: DC

## 2023-03-23 NOTE — Progress Notes (Signed)
    Virtual Visit via Telephone Note    I connected with Tasha Garrett on 03/23/2023 using the [email or web address] by telephone and verified that I was speaking with the correct person using two identifiers. Patient was located in her car with daughter. I am located at [facility] office.   The limitations of evaluation and management by telemedicine and the availability of in person appointments have been previously discussed with the patient and are documented in the patients chart. The patient expressed understanding and consented to proceed.  PCP: Philip Aspen, Limmie Patricia, MD  Chief Complaint: Discuss left carotid revascularization  History of Present Illness: Tasha Garrett is a 73 y.o. female with with history of CKD, CAD status post CABG, hypertension, hyperlipidemia that presents for phone follow-up to discuss left carotid revascularization.  She had a carotid duplex showing greater than 70% left ICA stenosis in [facility].  I sent her for CTA.  This showed a over 80% calcified left internal carotid stenosis.  She wanted to think about carotid endarterectomy versus TCAR.  Past Medical History:  Diagnosis Date   Blood in stool    Carotid artery occlusion    Chronic kidney disease    High cholesterol    Hypertension    Hypothyroid    Urinary tract infection     Past Surgical History:  Procedure Laterality Date   ABDOMINAL HYSTERECTOMY     AORTIC VALVE REPLACEMENT (AVR)/CORONARY ARTERY BYPASS GRAFTING (CABG)      No outpatient medications have been marked as taking for the 03/23/23 encounter (Appointment) with Cephus Shelling, MD.    12 system ROS was negative unless otherwise noted in HPI   Observations/Objective:  CTA neck 02/26/2023 shows a high-grade over 80% calcified left internal carotid stenosis   Carotid duplex today shows 1 to 39% right ICA stenosis and 60 to 79% left ICA stenosis with velocity of 357/82 (previously 246/75)  Assessment and Plan:  73 y.o. female,  with history of CKD, CAD s/p CABG, hypertension, hyperlipidemia that presents for phone follow-up after CTA neck to discuss left carotid revascularization.  Discussed she has a high-grade left ICA stenosis on CTA.  We previously discussed carotid endarterectomy versus TCAR.  She is very worried about anesthesia and the risk of waking up.  I discussed again the option of carotid endarterectomy versus TCAR.  She is favoring a more minimal invasive approach.  Will plan left TCAR for stroke risk reduction.  I did send Plavix to her pharmacy and will need aspirin Plavix plus statin.  I discussed she does not stop this around the time of surgery.  My office will call to schedule.  I discussed 1% perioperative stroke risk.    Follow Up Instructions:   Follow up: Schedule left TCAR   I discussed the assessment and treatment plan with the patient. The patient was provided an opportunity to ask questions and all were answered. The patient agreed with the plan and demonstrated an understanding of the instructions.   The patient was advised to call back or seek an in-person evaluation if the symptoms worsen or if the condition fails to improve as anticipated.  I spent 10 minutes with the patient via telephone encounter.   Signed, Cephus Shelling [facility] Office: [phone]  03/23/2023, 4:15 PM

## 2023-03-24 ENCOUNTER — Encounter: Payer: Self-pay | Admitting: Pulmonary Disease

## 2023-03-24 ENCOUNTER — Telehealth: Payer: Self-pay

## 2023-03-24 ENCOUNTER — Ambulatory Visit: Payer: Medicare HMO | Admitting: Pulmonary Disease

## 2023-03-24 VITALS — BP 136/64 | HR 60 | Temp 98.2°F | Ht 61.0 in | Wt 155.4 lb

## 2023-03-24 DIAGNOSIS — R053 Chronic cough: Secondary | ICD-10-CM

## 2023-03-24 DIAGNOSIS — R0602 Shortness of breath: Secondary | ICD-10-CM | POA: Diagnosis not present

## 2023-03-24 NOTE — Telephone Encounter (Signed)
 Attempted to call for surgery scheduling. LVM

## 2023-03-24 NOTE — Progress Notes (Signed)
 @Patient  ID: Tasha Garrett, female    DOB: 1950-03-01, 73 y.o.   MRN: [id number]  Chief Complaint  Patient presents with   Follow-up    Doing well.  No sx noted today.    Referring provider: Philip Aspen, [place]*  HPI:   73 y.o. woman whom we are seeing in follow up for evaluation of dyspnea on exertion.  Most recent PCP note reviewed. Most recent vascular surgery note reviewed.  Continues on Symbicort as needed.  Doing well.  Symptoms totally controlled. Using symbicort rarely.  HPI at initial visit: Patient with dyspnea for the last several months to couple years.  Worse on inclines or stairs.  No time of day when things are better or worse.  No seasonal environmental factors she can identify that make things better or worse.  No position that makes things better or worse.  She is used albuterol as needed.  She is not sure this helps it much at all.  No other alleviating or exacerbating factors.  Most recent chest imaging CT lung cancer 06/2021 personally reviewed and interpreted as mild emphysematous changes at the apices, mild lower lobe bronchiectasis, otherwise clear.  PMH: Hypertension, hyperlipidemia Surgical history: Hysterectomy, aortic valve replacement/CABG Family history: Mother with ALS Social history: Former smoker, 10-pack-year, quit 2012, lives in [place], works in [employer]	[place] at [employer] / Pulmonary Flowsheets:   ACT:      No data to display          MMRC:     No data to display          Epworth:      No data to display          Tests:   FENO:  No results found for: "NITRICOXIDE"  PFT:     No data to display          WALK:      No data to display          Imaging: Personally reviewed and as per EMR discussion in this note CT ANGIO NECK W OR WO CONTRAST Result Date: 03/08/2023 CLINICAL DATA:  Provided history: Stenosis of left carotid artery. EXAM: CT ANGIOGRAPHY NECK  TECHNIQUE: Multidetector CT imaging of the neck was performed using the standard protocol during bolus administration of intravenous contrast. Multiplanar CT image reconstructions and MIPs were obtained to evaluate the vascular anatomy. Carotid stenosis measurements (when applicable) are obtained utilizing NASCET criteria, using the distal internal carotid diameter as the denominator. RADIATION DOSE REDUCTION: This exam was performed according to the departmental dose-optimization program which includes automated exposure control, adjustment of the mA and/or kV according to patient size and/or use of iterative reconstruction technique. CONTRAST:  75mL ISOVUE-370 IOPAMIDOL (ISOVUE-370) INJECTION 76% COMPARISON:  None. FINDINGS: Aortic arch: Common origin of the innominate and left common carotid arteries. Atherosclerotic plaque within the visualized aortic arch and proximal major branch vessels of the neck. Right carotid system: CCA and ICA patent within the neck without hemodynamically significant stenosis (50% or greater). Mild atherosclerotic plaque about the carotid bifurcation and within the proximal ICA. Partially retropharyngeal course of the cervical ICA. Atherosclerotic plaque within the intracranial ICA with no more than mild stenosis. Left carotid system: CCA and ICA patent within the neck. Non-stenotic atherosclerotic plaque at the CCA origin. Calcified atherosclerotic plaque about the carotid bifurcation and within the proximal ICA. Resultant severe (greater than 80% stenosis) at the ICA origin. Atherosclerotic plaque within the intracranial ICA  with no more than mild stenosis. Vertebral arteries: Codominant and patent within the neck. Venous reflux of contrast obscures portions of the left vertebral artery V1 segment. Within this limitation, findings are as follows. No significant atherosclerotic stenosis. Severe narrowing of the right vertebral artery at the C5 level due to mass effect from  degenerative bony spurring. Moderate/severe narrowing of the right vertebral artery at the C6 level due to mass effect from degenerative bony spurring. Skeleton: Cervical spondylosis with multilevel spinal canal narrowing. Most notably at C5-C6 and C6-C7, posterior disc osteophyte complexes contribute to at least moderate spinal canal stenosis. Multilevel bony neural foraminal narrowing. Disc space narrowing is greatest at C5-C6 and C6-C7 (advanced at these levels). Other neck: No neck mass or cervical lymphadenopathy. Upper chest: Prior median sternotomy. No consolidation within the imaged lung apices. IMPRESSION: 1. The common carotid and internal carotid arteries are patent within the neck. Atherosclerotic plaque bilaterally. Most notably, atherosclerotic plaque about the left carotid bifurcation and within the proximal left internal carotid artery results in a severe (greater than 80%) stenosis. No hemodynamically significant stenosis within the right common carotid or right cervical internal carotid arteries. 2. Venous reflux of contrast obscures portions of the left vertebral artery V1 segment. Within this limitation, the is no significant atherosclerotic disease within the cervical vertebral arteries. However, the right vertebral artery is severely narrowed at the C5 level, and moderate-to-severely narrowed at the C6 level, due to mass effect from degenerative bony spurring. 3. Cervical spondylosis as described. Electronically Signed   By: Jackey Loge D.O.   On: 03/08/2023 14:23    Lab Results: Personally reviewed, notably eosinophils 400 07/2021 CBC    Component Value Date/Time   WBC 6.9 05/20/2022 1114   RBC 3.90 05/20/2022 1114   HGB 11.4 (L) 05/20/2022 1114   HCT 34.5 (L) 05/20/2022 1114   PLT 347.0 05/20/2022 1114   MCV 88.4 05/20/2022 1114   MCHC 33.0 05/20/2022 1114   RDW 13.2 05/20/2022 1114   LYMPHSABS 1.8 05/20/2022 1114   MONOABS 0.7 05/20/2022 1114   EOSABS 0.5 05/20/2022 1114    BASOSABS 0.1 05/20/2022 1114    BMET    Component Value Date/Time   NA 141 05/20/2022 1114   NA 143 05/27/2021 0000   K 5.1 05/20/2022 1114   CL 106 05/20/2022 1114   CO2 26 05/20/2022 1114   GLUCOSE 143 (H) 05/20/2022 1114   BUN 26 (H) 05/20/2022 1114   CREATININE 1.29 (H) 05/20/2022 1114   CALCIUM 10.8 (H) 05/20/2022 1114    BNP No results found for: "BNP"  ProBNP No results found for: "PROBNP"  Specialty Problems   None   No Known Allergies  Immunization History  Administered Date(s) Administered   Fluad Quad(high Dose 65+) 11/11/2021   Fluad Trivalent(High Dose 65+) 09/21/2022   Influenza, High Dose Seasonal PF 11/04/2015, 11/17/2016, 11/18/2017, 11/24/2018, 10/03/2019, 09/30/2020   Influenza-Unspecified 10/26/2020   Moderna Sars-Covid-2 Vaccination 03/13/2019, 04/08/2019, 03/01/2020   Pneumococcal Conjugate-13 12/14/2017   Pneumococcal Polysaccharide-23 05/25/2016   RSV,unspecified 10/02/2022   Td (Adult),5 Lf Tetanus Toxid, Preservative Free 10/22/2017   Tdap 10/22/2017   Zoster Recombinant(Shingrix) 10/31/2018, 12/30/2018    Past Medical History:  Diagnosis Date   Blood in stool    Carotid artery occlusion    Chronic kidney disease    High cholesterol    Hypertension    Hypothyroid    Urinary tract infection     Tobacco History: Social History   Tobacco Use  Smoking Status  Former   Current packs/day: 0.00   Average packs/day: 0.5 packs/day for 20.0 years (10.0 ttl pk-yrs)   Types: Cigarettes   Start date: 04/1990   Quit date: 04/2010   Years since quitting: 12.9  Smokeless Tobacco Never  Tobacco Comments   Pt states she stopped smoking 10 years ago and at most smoked 1/2 ppd. ALS 10/12   Counseling given: Not Answered Tobacco comments: Pt states she stopped smoking 10 years ago and at most smoked 1/2 ppd. ALS 10/12   Continue to not smoke  Outpatient Encounter Medications as of 03/24/2023  Medication Sig   amLODipine (NORVASC) 10 MG  tablet TAKE 1 TABLET EVERY DAY   ASPIRIN 81 PO Take 1 tablet by mouth daily.   atorvastatin (LIPITOR) 40 MG tablet Take 1 tablet (40 mg total) by mouth at bedtime.   brimonidine (ALPHAGAN) 0.2 % ophthalmic solution Place 1 drop into both eyes 2 (two) times daily.   clopidogrel (PLAVIX) 75 MG tablet Take 1 tablet (75 mg total) by mouth daily.   Cod Liver Oil CAPS Take by mouth.   cyclobenzaprine (FLEXERIL) 5 MG tablet Take 1 tablet (5 mg total) by mouth at bedtime as needed.   diclofenac Sodium (VOLTAREN) 1 % GEL APPLY 2 GRAMS TOPICALLY 4 TIMES DAILY   isosorbide mononitrate (IMDUR) 30 MG 24 hr tablet Take 1 tablet (30 mg total) by mouth daily.   latanoprost (XALATAN) 0.005 % ophthalmic solution 1 drop at bedtime.   levothyroxine (SYNTHROID) 137 MCG tablet Take 1 tablet (137 mcg total) by mouth daily.   losartan (COZAAR) 100 MG tablet Take 1 tablet (100 mg total) by mouth daily.   metoprolol tartrate (LOPRESSOR) 25 MG tablet Take 1 tablet (25 mg total) by mouth 2 (two) times daily.   Multiple Vitamins-Minerals (MULTIVITAMIN ADULTS 50+ PO) Take 1 tablet by mouth daily.   SYMBICORT 160-4.5 MCG/ACT inhaler INHALE 2 PUFFS INTO THE LUNGS IN THE MORNING AND AT BEDTIME.   No facility-administered encounter medications on file as of 03/24/2023.     Review of Systems  Review of Systems  N/a Physical Exam  BP 136/64 (BP Location: Left Arm, Patient Position: Sitting, Cuff Size: Normal)   Pulse 60   Temp 98.2 F (36.8 C) (Oral)   Ht 5\' 1"  (1.549 m)   Wt 155 lb 6.4 oz (70.5 kg)   SpO2 96%   BMI 29.36 kg/m   Wt Readings from Last 5 Encounters:  03/24/23 155 lb 6.4 oz (70.5 kg)  03/22/23 156 lb (70.8 kg)  03/09/23 155 lb 14.4 oz (70.7 kg)  02/09/23 154 lb (69.9 kg)  12/21/22 156 lb 6.4 oz (70.9 kg)    BMI Readings from Last 5 Encounters:  03/24/23 29.36 kg/m  03/22/23 29.48 kg/m  03/09/23 29.46 kg/m  02/09/23 29.10 kg/m  12/21/22 29.55 kg/m     Physical Exam General: Sitting  in chair, no acute distress Eyes: EOMI, no icterus Neck: Supple, no JVP Pulmonary: Clear, normal work of breathing Cardiovascular: Warm, no edema Abdomen: Nondistended, bowel sounds present MSK: No synovitis, no joint effusion Neuro: Normal gait, no weakness Psych: Normal mood, full affect   Assessment & Plan:   Dyspnea on exertion, wheeze, cough: Asthma versus smoking-related lung disease.  Emphysema albeit mild on prior CT scan.  No PFTs in the past.  Marked improvement on high-dose Symbicort 2 puff twice daily.  Now using as needed.  Cough: Likely multifactorial.  Related to smoking-related disease or asthma as above.  She also  cleared describes aspiration symptoms when she swallows.  She declined speech pathologist evaluation.  She has bilateral lower lobe bronchiectasis likely in the setting of chronic aspiration.  Improved with inhaler therapy.  De-escalating Symbicort, now as needed with good control.   Return in about 1 year (around 03/23/2024) for f/u Dr. Judeth Horn.   Karren Burly, MD 03/24/2023

## 2023-03-29 ENCOUNTER — Other Ambulatory Visit: Payer: Self-pay

## 2023-03-29 ENCOUNTER — Telehealth: Payer: Self-pay

## 2023-03-29 DIAGNOSIS — I6522 Occlusion and stenosis of left carotid artery: Secondary | ICD-10-CM

## 2023-03-29 NOTE — Telephone Encounter (Signed)
 Patient called to ask if her upcoming 4/23 surgery will be a problem with her trip in May.  She was advised that most patients that have a TCAR go home the next day.  Those that work, will go back to work in 2 days.  Patient stated that she doesn't foresee doing anything strenuous after her surgery.

## 2023-03-30 ENCOUNTER — Telehealth: Payer: Self-pay

## 2023-03-30 DIAGNOSIS — H401131 Primary open-angle glaucoma, bilateral, mild stage: Secondary | ICD-10-CM | POA: Diagnosis not present

## 2023-03-30 NOTE — Telephone Encounter (Signed)
 Patient's daughter called to confirm instructions re: Plavix.  Per Dr. Chestine Spore, take 7 days prior to [facility] and 30 days afterwards.

## 2023-03-31 ENCOUNTER — Ambulatory Visit: Payer: Medicare HMO | Admitting: Orthopaedic Surgery

## 2023-03-31 DIAGNOSIS — S83272D Complex tear of lateral meniscus, current injury, left knee, subsequent encounter: Secondary | ICD-10-CM | POA: Diagnosis not present

## 2023-03-31 NOTE — Progress Notes (Signed)
 The patient is a 73 year old female that I am seeing for the first time but she has seen one of the PAs in the office.  This is mainly for her left knee.  She has had recurrent effusions of the left knee and has had multiple injections and aspirations over time.  She is diabetic.  She is also on Plavix.  She was sent to me to discuss the potential for knee replacement surgery.  She does have a MRI on the system from a review of her left knee.  She says she is not ready for surgery just yet given the fact that she has to have carotid surgery on her neck next month.  She does report continued swelling with her left knee.  On examination of the left knee today there is pain over the lateral joint line and a positive McMurray's exam to the lateral joint line.  There is a moderate effusion as well.  She has good range of motion of the knee.  She says her biggest complaint is not being able to get down and up and she is on the ground because of that knee.  X-rays of the left knee were reviewed and showed still well-maintained medial lateral compartments.  The MRI is reviewed and there was some slight subchondral edema laterally and definitely evidence of a lateral meniscal tear.  There was no full-thickness cartilage deficits and the thinning of the cartilage is high-grade but definitely not full-thickness.  From my standpoint I think that she would rather consider arthroscopic intervention for that knee and I agree with this as well.  The medial side shows no chondral defect and the cartilage thinning is not full-thickness.  She does have a significant lateral meniscal tear and I think that may be causing her continued effusion with that knee.  We will see her back in 2 months to see how she is doing overall in terms of recovery from carotid surgery and then further discussed the potential for an arthroscopic intervention for her left knee.  She agrees with this treatment plan.

## 2023-04-01 DIAGNOSIS — E785 Hyperlipidemia, unspecified: Secondary | ICD-10-CM | POA: Diagnosis not present

## 2023-04-01 DIAGNOSIS — I6529 Occlusion and stenosis of unspecified carotid artery: Secondary | ICD-10-CM | POA: Diagnosis not present

## 2023-04-01 DIAGNOSIS — Z01810 Encounter for preprocedural cardiovascular examination: Secondary | ICD-10-CM | POA: Diagnosis not present

## 2023-04-01 DIAGNOSIS — I2510 Atherosclerotic heart disease of native coronary artery without angina pectoris: Secondary | ICD-10-CM | POA: Diagnosis not present

## 2023-04-01 DIAGNOSIS — N183 Chronic kidney disease, stage 3 unspecified: Secondary | ICD-10-CM | POA: Diagnosis not present

## 2023-04-01 DIAGNOSIS — I10 Essential (primary) hypertension: Secondary | ICD-10-CM | POA: Diagnosis not present

## 2023-04-01 DIAGNOSIS — Z6829 Body mass index (BMI) 29.0-29.9, adult: Secondary | ICD-10-CM | POA: Diagnosis not present

## 2023-04-01 DIAGNOSIS — Z87891 Personal history of nicotine dependence: Secondary | ICD-10-CM | POA: Diagnosis not present

## 2023-04-20 ENCOUNTER — Other Ambulatory Visit: Payer: Self-pay | Admitting: Internal Medicine

## 2023-04-21 DIAGNOSIS — I10 Essential (primary) hypertension: Secondary | ICD-10-CM | POA: Diagnosis not present

## 2023-04-21 DIAGNOSIS — F17210 Nicotine dependence, cigarettes, uncomplicated: Secondary | ICD-10-CM | POA: Diagnosis not present

## 2023-04-21 DIAGNOSIS — E785 Hyperlipidemia, unspecified: Secondary | ICD-10-CM | POA: Diagnosis not present

## 2023-04-26 ENCOUNTER — Other Ambulatory Visit: Payer: Self-pay

## 2023-04-26 ENCOUNTER — Encounter (HOSPITAL_COMMUNITY): Payer: Self-pay

## 2023-04-26 NOTE — Progress Notes (Signed)
 Surgical Instructions   Your procedure is scheduled on Wednesday, April 23rd, 2025. Report to [facility] Main Entrance "A" at 5:30 A.M., then check in with the Admitting office. Any questions or running late day of surgery: call [phone]  Questions prior to your surgery date: call [phone], Monday-Friday, 8am-4pm. If you experience any cold or flu symptoms such as cough, fever, chills, shortness of breath, etc. between now and your scheduled surgery, please notify us at the above number.     Remember:  Do not eat or drink after midnight the night before your surgery     Take these medicines the morning of surgery with A SIP OF WATER: Amlodipine (Norvasc) Aspirin Clopidogrel (Plavix) Levothyroxine (Synthroid) Metoprolol Tartrate (Lopressor) Eye drops   May take these medicines IF NEEDED: Symbicort Inhaler    One week prior to surgery, STOP taking any Aleve, Naproxen, Ibuprofen, Motrin, Advil, Goody's, BC's, all herbal medications, fish oil, and non-prescription vitamins.                     Do NOT Smoke (Tobacco/Vaping) for 24 hours prior to your procedure.  If you use a CPAP at night, you may bring your mask/headgear for your overnight stay.   You will be asked to remove any contacts, glasses, piercing's, hearing aid's, dentures/partials prior to surgery. Please bring cases for these items if needed.    Patients discharged the day of surgery will not be allowed to drive home, and someone needs to stay with them for 24 hours.  SURGICAL WAITING ROOM VISITATION Patients may have no more than 2 support people in the waiting area - these visitors may rotate.   Pre-op nurse will coordinate an appropriate time for 1 ADULT support person, who may not rotate, to accompany patient in pre-op.  Children under the age of 27 must have an adult with them who is not the patient and must remain in the main waiting area with an adult.  If the patient needs to stay at the hospital  during part of their recovery, the visitor guidelines for inpatient rooms apply.  Please refer to the [facility] website for the visitor guidelines for any additional information.   If you received a COVID test during your pre-op visit  it is requested that you wear a mask when out in public, stay away from anyone that may not be feeling well and notify your surgeon if you develop symptoms. If you have been in contact with anyone that has tested positive in the last 10 days please notify you surgeon.      Pre-operative CHG Bathing Instructions   You can play a key role in reducing the risk of infection after surgery. Your skin needs to be as free of germs as possible. You can reduce the number of germs on your skin by washing with CHG (chlorhexidine gluconate) soap before surgery. CHG is an antiseptic soap that kills germs and continues to kill germs even after washing.   DO NOT use if you have an allergy to chlorhexidine/CHG or antibacterial soaps. If your skin becomes reddened or irritated, stop using the CHG and notify one of our RNs at [phone].              TAKE A SHOWER THE NIGHT BEFORE SURGERY AND THE DAY OF SURGERY    Please keep in mind the following:  DO NOT shave, including legs and underarms, 48 hours prior to surgery.   You may shave your face  before/day of surgery.  Place clean sheets on your bed the night before surgery Use a clean washcloth (not used since being washed) for each shower. DO NOT sleep with pet's night before surgery.  CHG Shower Instructions:  Wash your face and private area with normal soap. If you choose to wash your hair, wash first with your normal shampoo.  After you use shampoo/soap, rinse your hair and body thoroughly to remove shampoo/soap residue.  Turn the water OFF and apply half the bottle of CHG soap to a CLEAN washcloth.  Apply CHG soap ONLY FROM YOUR NECK DOWN TO YOUR TOES (washing for 3-5 minutes)  DO NOT use CHG soap on face, private  areas, open wounds, or sores.  Pay special attention to the area where your surgery is being performed.  If you are having back surgery, having someone wash your back for you may be helpful. Wait 2 minutes after CHG soap is applied, then you may rinse off the CHG soap.  Pat dry with a clean towel  Put on clean pajamas    Additional instructions for the day of surgery: DO NOT APPLY any lotions, deodorants, cologne, or perfumes.   Do not wear jewelry or makeup Do not wear nail polish, gel polish, artificial nails, or any other type of covering on natural nails (fingers and toes) Do not bring valuables to the hospital. [facility] is not responsible for valuables/personal belongings. Put on clean/comfortable clothes.  Please brush your teeth.  Ask your nurse before applying any prescription medications to the skin.

## 2023-04-26 NOTE — Progress Notes (Signed)
 PCP - Dr Marguerita Shih Cardiologist - none Pulmonary - Dr Orbie Binder  CT Chest x-ray - 01/08/23 EKG - 04/27/23 Stress Test - *** ECHO - 05/13/11 CE Cardiac Cath - ***  ICD Pacemaker/Loop - n/a  Sleep Study -  n/a  Diabetes Type 2, no meds If your blood sugar is less than 70 mg/dL, you will need to treat for low blood sugar: Treat a low blood sugar (less than 70 mg/dL) with  cup of clear juice (cranberry or apple), 4 glucose tablets, OR glucose gel. Recheck blood sugar in 15 minutes after treatment (to make sure it is greater than 70 mg/dL). If your blood sugar is not greater than 70 mg/dL on recheck, call [phone] for further instructions.  Aspirin and Plavix - Continue per MD.  NPO   Anesthesia review: Yes  STOP now taking any Aspirin (unless otherwise instructed by your surgeon), Aleve, Naproxen, Ibuprofen, Motrin, Advil, Goody's, BC's, all herbal medications, fish oil, and all vitamins.   Coronavirus Screening Do you have any of the following symptoms:  Cough yes/no: No Fever (>100.57F)  yes/no: No Runny nose yes/no: No Sore throat yes/no: No Difficulty breathing/shortness of breath  yes/no: No  Have you traveled in the last 14 days and where? yes/no: No  Patient verbalized understanding of instructions that were given to them at the PAT appointment. Patient was also instructed that they will need to review over the PAT instructions again at home before surgery.

## 2023-04-27 ENCOUNTER — Encounter (HOSPITAL_COMMUNITY)
Admission: RE | Admit: 2023-04-27 | Discharge: 2023-04-27 | Disposition: A | Discharge status: Home / Self Care | Source: Ambulatory Visit | Attending: Vascular Surgery | Admitting: Vascular Surgery

## 2023-04-27 ENCOUNTER — Encounter (HOSPITAL_COMMUNITY): Payer: Self-pay

## 2023-04-27 ENCOUNTER — Other Ambulatory Visit: Payer: Self-pay

## 2023-04-27 VITALS — BP 134/62 | HR 65 | Temp 98.2°F | Resp 17 | Ht 61.0 in | Wt 153.9 lb

## 2023-04-27 DIAGNOSIS — I6522 Occlusion and stenosis of left carotid artery: Secondary | ICD-10-CM | POA: Insufficient documentation

## 2023-04-27 DIAGNOSIS — N1831 Chronic kidney disease, stage 3a: Secondary | ICD-10-CM | POA: Insufficient documentation

## 2023-04-27 DIAGNOSIS — E1122 Type 2 diabetes mellitus with diabetic chronic kidney disease: Secondary | ICD-10-CM | POA: Insufficient documentation

## 2023-04-27 DIAGNOSIS — Z87891 Personal history of nicotine dependence: Secondary | ICD-10-CM | POA: Insufficient documentation

## 2023-04-27 DIAGNOSIS — R001 Bradycardia, unspecified: Secondary | ICD-10-CM | POA: Insufficient documentation

## 2023-04-27 DIAGNOSIS — Z951 Presence of aortocoronary bypass graft: Secondary | ICD-10-CM | POA: Diagnosis not present

## 2023-04-27 DIAGNOSIS — Z01818 Encounter for other preprocedural examination: Secondary | ICD-10-CM | POA: Diagnosis not present

## 2023-04-27 DIAGNOSIS — I498 Other specified cardiac arrhythmias: Secondary | ICD-10-CM | POA: Insufficient documentation

## 2023-04-27 DIAGNOSIS — E785 Hyperlipidemia, unspecified: Secondary | ICD-10-CM | POA: Diagnosis not present

## 2023-04-27 DIAGNOSIS — I129 Hypertensive chronic kidney disease with stage 1 through stage 4 chronic kidney disease, or unspecified chronic kidney disease: Secondary | ICD-10-CM | POA: Diagnosis not present

## 2023-04-27 DIAGNOSIS — Z7982 Long term (current) use of aspirin: Secondary | ICD-10-CM | POA: Insufficient documentation

## 2023-04-27 DIAGNOSIS — Z7902 Long term (current) use of antithrombotics/antiplatelets: Secondary | ICD-10-CM | POA: Diagnosis not present

## 2023-04-27 DIAGNOSIS — I252 Old myocardial infarction: Secondary | ICD-10-CM | POA: Diagnosis not present

## 2023-04-27 DIAGNOSIS — E039 Hypothyroidism, unspecified: Secondary | ICD-10-CM | POA: Insufficient documentation

## 2023-04-27 DIAGNOSIS — Z79899 Other long term (current) drug therapy: Secondary | ICD-10-CM | POA: Insufficient documentation

## 2023-04-27 DIAGNOSIS — I2510 Atherosclerotic heart disease of native coronary artery without angina pectoris: Secondary | ICD-10-CM | POA: Diagnosis not present

## 2023-04-27 DIAGNOSIS — J439 Emphysema, unspecified: Secondary | ICD-10-CM | POA: Insufficient documentation

## 2023-04-27 HISTORY — DX: Type 2 diabetes mellitus without complications: E11.9

## 2023-04-27 HISTORY — DX: Unspecified osteoarthritis, unspecified site: M19.90

## 2023-04-27 HISTORY — DX: Atherosclerotic heart disease of native coronary artery without angina pectoris: I25.10

## 2023-04-27 LAB — CBC
HCT: 39.5 % (ref 36.0–46.0)
Hemoglobin: 11.9 g/dL — ABNORMAL LOW (ref 12.0–15.0)
MCH: 27.2 pg (ref 26.0–34.0)
MCHC: 30.1 g/dL (ref 30.0–36.0)
MCV: 90.4 fL (ref 80.0–100.0)
Platelets: 362 10*3/uL (ref 150–400)
RBC: 4.37 MIL/uL (ref 3.87–5.11)
RDW: 12.5 % (ref 11.5–15.5)
WBC: 8 10*3/uL (ref 4.0–10.5)
nRBC: 0 % (ref 0.0–0.2)

## 2023-04-27 LAB — TYPE AND SCREEN
ABO/RH(D): O NEG
Antibody Screen: NEGATIVE

## 2023-04-27 LAB — COMPREHENSIVE METABOLIC PANEL WITH GFR
ALT: 17 U/L (ref 0–44)
AST: 21 U/L (ref 15–41)
Albumin: 3.9 g/dL (ref 3.5–5.0)
Alkaline Phosphatase: 103 U/L (ref 38–126)
Anion gap: 10 (ref 5–15)
BUN: 24 mg/dL — ABNORMAL HIGH (ref 8–23)
CO2: 23 mmol/L (ref 22–32)
Calcium: 10.5 mg/dL — ABNORMAL HIGH (ref 8.9–10.3)
Chloride: 107 mmol/L (ref 98–111)
Creatinine, Ser: 1.06 mg/dL — ABNORMAL HIGH (ref 0.44–1.00)
GFR, Estimated: 56 mL/min — ABNORMAL LOW (ref >60)
Glucose, Bld: 143 mg/dL — ABNORMAL HIGH (ref 70–99)
Potassium: 4.3 mmol/L (ref 3.5–5.1)
Sodium: 140 mmol/L (ref 135–145)
Total Bilirubin: 0.6 mg/dL (ref 0.0–1.2)
Total Protein: 7.3 g/dL (ref 6.5–8.1)

## 2023-04-27 LAB — URINALYSIS, ROUTINE W REFLEX MICROSCOPIC
Bilirubin Urine: NEGATIVE
Glucose, UA: NEGATIVE mg/dL
Hgb urine dipstick: NEGATIVE
Ketones, ur: NEGATIVE mg/dL
Nitrite: NEGATIVE
Protein, ur: 30 mg/dL — AB
Specific Gravity, Urine: 1.012 (ref 1.005–1.030)
pH: 6 (ref 5.0–8.0)

## 2023-04-27 LAB — APTT: aPTT: 33 s (ref 24–36)

## 2023-04-27 LAB — SURGICAL PCR SCREEN
MRSA, PCR: NEGATIVE
Staphylococcus aureus: NEGATIVE

## 2023-04-27 NOTE — Progress Notes (Signed)
 [employer] sent to MD to review abnormal UA results from PAT appointment on 04/27/23.

## 2023-04-28 ENCOUNTER — Other Ambulatory Visit: Payer: Self-pay

## 2023-04-28 ENCOUNTER — Encounter (HOSPITAL_COMMUNITY): Payer: Self-pay

## 2023-04-28 ENCOUNTER — Telehealth: Payer: Self-pay

## 2023-04-28 MED ORDER — SULFAMETHOXAZOLE-TRIMETHOPRIM 800-160 MG PO TABS: 1.0000 | ORAL_TABLET | Freq: Two times a day (BID) | ORAL | 0 refills | Status: AC

## 2023-04-28 NOTE — Progress Notes (Signed)
 Anesthesia Chart Review:  Case: [id number] Date/Time: 05/05/23 0715   Procedure: TRANSCAROTID ARTERY REVASCULARIZATION (TCAR) (Left)   Anesthesia type: General   Diagnosis: Stenosis of left carotid artery [I65.22]   Pre-op diagnosis: STENOSIS LEFT CAROTID   Location: [facility] ROOM 16 / [facility] OR   Surgeons: Cephus Shelling, MD       DISCUSSION: Patient is scheduled for the above procedure. She will be 73 by her surgery date.   History includes former smoker (quit 04/13/10), HTN, HLD, CAD (NSTEMI 2012, s/p BMS in [place]; s/p CABG x3: LIMA-mLAD, SVG-OM, SVG-RCA 05/13/11 [facility]), carotid artery stenosis, DM2, hypothyroidism, CKD.  She had preoperative cardiology evaluation on 04/01/23 by Dr. Catha Gosselin (scanned under Media tab). He wrote: "Patient with coronary artery bypass surgery scheduled for left TCAR for carotid artery stenosis.  Since her bypass, she has no unstable angina, arrhythmia, congestive heart failure symptoms.  She is on beta-blocker therapy.  While it is acceptable.  Echocardiogram last year he feels no major abnormalities.  She exercises at least 5 days a week and that she was a fair exercise capacity.  As result I do not see any reason for preoperative stress testing.  She would be considered a moderate cardiovascular risk.  Antihypertensives especially beta-blockers amlodipine and nitrates should be continued perioperatively."  Last pulmonology visit with Dr. Judeth Horn was on 03/24/23 for follow-up cough and DOE. Cough, likely multifactorial. May have component of asthma. Mild emphysema on imaging. Symptoms improved on Symbicort 2 puffs BID, de-escalate as needed. She declined ST evaluation to evaluation for aspiration. 1 year follow-up recommended.  She is on Aspirin and will begin Plavix 7 days prior to procedure.   Have requested copy of 2024 echo. (UPDATE 04/29/23 5:42 PM: Received records from [facility] including copies of 02/01/22 EKG, 03/24/22 echo showing  normal biventricular function with no significant valvular abnormalities, and 04/21/23 abd aorta US showing no AAA but likely mild-moderate iliac artery stenosis. Records are scanned under Media tab.)   VS: BP 134/62   Pulse 65   Temp 36.8 C   Resp 17   Ht 5\' 1"  (1.549 m)   Wt 69.8 kg   SpO2 99%   BMI 29.08 kg/m   PROVIDERS: Philip Aspen, Limmie Patricia, MD is PCP  Darlyn Chamber, MD is cardiologist ([facility]) Rise Paganini, MD is nephrologist Vilma Meckel, MD is pulmonologist Sherald Hess, MD is vascular surgeon   LABS: Preoperative labs noted. Calcium 10.5, similar to labs from 05/20/22. A1c 6.6% on 03/22/23. VVS prescribed Bactrim for moderate leukocytes in UA.  (all labs ordered are listed, but only abnormal results are displayed)  Labs Reviewed  CBC - Abnormal; Notable for the following components:      Result Value   Hemoglobin 11.9 (*)    All other components within normal limits  COMPREHENSIVE METABOLIC PANEL WITH GFR - Abnormal; Notable for the following components:   Glucose, Bld 143 (*)    BUN 24 (*)    Creatinine, Ser 1.06 (*)    Calcium 10.5 (*)    GFR, Estimated 56 (*)    All other components within normal limits  URINALYSIS, ROUTINE W REFLEX MICROSCOPIC - Abnormal; Notable for the following components:   Protein, ur 30 (*)    Leukocytes,Ua MODERATE (*)    Bacteria, UA RARE (*)    All other components within normal limits  SURGICAL PCR SCREEN  APTT  TYPE AND SCREEN     IMAGES: CTA Neck 02/26/23:  IMPRESSION: 1. The common carotid and internal carotid arteries are patent within the neck. Atherosclerotic plaque bilaterally. Most notably, atherosclerotic plaque about the left carotid bifurcation and within the proximal left internal carotid artery results in a severe (greater than 80%) stenosis. No hemodynamically significant stenosis within the right common carotid or right cervical internal carotid arteries. 2. Venous reflux  of contrast obscures portions of the left vertebral artery V1 segment. Within this limitation, the is no significant atherosclerotic disease within the cervical vertebral arteries. However, the right vertebral artery is severely narrowed at the C5 level, and moderate-to-severely narrowed at the C6 level, due to mass effect from degenerative bony spurring. 3. Cervical spondylosis as described.   CT Chest LCS 01/08/23: IMPRESSION: 1. Lung-RADS 2, benign appearance or behavior. Continue annual screening with low-dose chest CT without contrast in 12 months. 2. Cirrhosis. 3. Liver appears cirrhotic. 4.  Aortic atherosclerosis (ICD10-I70.0). 5.  Emphysema (ICD10-J43.9).    EKG: 04/27/23: Sinus bradycardia at 55 bpm with sinus arrhythmia No previous ECGs available Confirmed by Dina Rich [phone]) on 04/27/2023 8:09:13 PM   CV: [place] Abd Aorta 04/21/23 ([facility], scanned under Media tab, AMB Correspondence):  Conclusions: 1.  There is no evidence of infrarenal abdominal aortic aneurysm. 2.  Likely mild to moderate common iliac artery stenosis.   US Carotid 02/09/23: Summary:  - Right Carotid: Velocities in the right ICA are consistent with a 1-39%  stenosis.  - Left Carotid: Velocities in the left ICA are consistent with a 60-79%  stenosis.               The ECA appears >50% stenosed.  - Vertebrals:  Bilateral vertebral arteries demonstrate antegrade flow.  - Subclavians: Normal flow hemodynamics were seen in bilateral subclavian               arteries.    Echo 03/24/22 ([facility], scanned under Media tab, AMB Correspondence):  Conclusions: 1.  Normal biventricular dimensions and systolic function.  LVEF greater than 55%. 2.  No major valvular abnormalities noted.   Other findings:   - Aortic valve mildly thickened with no evidence of aortic stenosis.  Mild mitral annular calcification with no mitral regurgitation.  Trace tricuspid regurgitation.  No evidence of pulmonary  hypertension. - Interatrial and interventricular septum intact.   Past Medical History:  Diagnosis Date   Arthritis    Blood in stool    Carotid artery occlusion    Chronic kidney disease    stage 3A   Coronary arteriosclerosis    Diabetes mellitus without complication (HCC)    type 2, no meds, does not check blood sugar per pt on 04/27/23   High cholesterol    Hypertension    Hypothyroid    Myocardial infarction [facility]) 2012   in [place]   Urinary tract infection     Past Surgical History:  Procedure Laterality Date   ABDOMINAL HYSTERECTOMY     COLONOSCOPY     CORONARY ARTERY BYPASS GRAFT  05/13/2011   CABG x3: LIMA-mid LAD, SVG-OM, SVG-RCA ([facility])   EYE SURGERY Bilateral    cataracts removed    MEDICATIONS:  sulfamethoxazole-trimethoprim (BACTRIM DS) 800-160 MG tablet   amLODipine (NORVASC) 10 MG tablet   aspirin EC 81 MG tablet   atorvastatin (LIPITOR) 40 MG tablet   brimonidine (ALPHAGAN) 0.2 % ophthalmic solution   clopidogrel (PLAVIX) 75 MG tablet   Cod Liver Oil CAPS   cyclobenzaprine (FLEXERIL) 5 MG tablet   diclofenac Sodium (VOLTAREN) 1 %  GEL   isosorbide mononitrate (IMDUR) 30 MG 24 hr tablet   latanoprost (XALATAN) 0.005 % ophthalmic solution   levothyroxine (SYNTHROID) 137 MCG tablet   losartan (COZAAR) 100 MG tablet   metoprolol tartrate (LOPRESSOR) 25 MG tablet   Multiple Vitamin (MULTIVITAMIN WITH MINERALS) TABS tablet   SYMBICORT 160-4.5 MCG/ACT inhaler   No current facility-administered medications for this encounter.    Ella Gun, PA-C Surgical Short Stay/Anesthesiology [facility] Phone [phone] [facility] Phone [phone] 04/28/2023 1:55 PM

## 2023-04-28 NOTE — Telephone Encounter (Signed)
 Left message for patient re: ABX called in to [facility] in [place].

## 2023-04-28 NOTE — Anesthesia Preprocedure Evaluation (Addendum)
 Anesthesia Evaluation  Patient identified by MRN, date of birth, ID band Patient awake    Reviewed: Allergy & Precautions, NPO status , Patient's Chart, lab work & pertinent test results, reviewed documented beta blocker date and time   Airway Mallampati: III  TM Distance: >3 FB Neck ROM: Full    Dental  (+) Partial Lower, Partial Upper, Dental Advisory Given   Pulmonary former smoker   Pulmonary exam normal breath sounds clear to auscultation       Cardiovascular hypertension, Pt. on medications and Pt. on home beta blockers + CAD, + Past MI and + CABG  Normal cardiovascular exam Rhythm:Regular Rate:Normal     Neuro/Psych negative neurological ROS     GI/Hepatic negative GI ROS, Neg liver ROS,,,  Endo/Other  diabetesHypothyroidism    Renal/GU Renal disease     Musculoskeletal  (+) Arthritis ,    Abdominal   Peds  Hematology negative hematology ROS (+)   Anesthesia Other Findings   Reproductive/Obstetrics                             Anesthesia Physical Anesthesia Plan  ASA: 3  Anesthesia Plan: General   Post-op Pain Management: Tylenol  PO (pre-op)*   Induction: Intravenous  PONV Risk Score and Plan: 3 and Ondansetron , Dexamethasone  and Treatment may vary due to age or medical condition  Airway Management Planned: Oral ETT  Additional Equipment: Arterial line  Intra-op Plan:   Post-operative Plan: Extubation in OR  Informed Consent: I have reviewed the patients History and Physical, chart, labs and discussed the procedure including the risks, benefits and alternatives for the proposed anesthesia with the patient or authorized representative who has indicated his/her understanding and acceptance.     Dental advisory given  Plan Discussed with: CRNA  Anesthesia Plan Comments: (2 x PIV, remi gtt  PAT note written by Ella Gun, PA-C.  )       Anesthesia Quick  Evaluation

## 2023-04-29 ENCOUNTER — Telehealth: Payer: Self-pay

## 2023-04-29 NOTE — Telephone Encounter (Signed)
 Have left patient 3 messages in re: to medication prior to surgery.

## 2023-05-03 ENCOUNTER — Other Ambulatory Visit (HOSPITAL_COMMUNITY): Payer: Self-pay

## 2023-05-04 ENCOUNTER — Other Ambulatory Visit (HOSPITAL_COMMUNITY): Payer: Self-pay

## 2023-05-04 MED ORDER — BRIMONIDINE TARTRATE 0.2 % OP SOLN
1.0000 [drp] | Freq: Two times a day (BID) | OPHTHALMIC | 4 refills | Status: AC
Start: 2022-12-03 — End: ?
  Filled 2023-07-16: qty 15, 75d supply, fill #0

## 2023-05-04 MED ORDER — LATANOPROST 0.005 % OP SOLN
1.0000 [drp] | Freq: Every day | OPHTHALMIC | 4 refills | Status: AC
Start: 2022-12-17 — End: ?
  Filled 2023-05-04: qty 2.5, 50d supply, fill #0
  Filled 2023-07-16: qty 2.5, 50d supply, fill #1
  Filled 2023-11-15 – 2023-11-25 (×2): qty 2.5, 50d supply, fill #2

## 2023-05-05 ENCOUNTER — Inpatient Hospital Stay (HOSPITAL_COMMUNITY)

## 2023-05-05 ENCOUNTER — Encounter (HOSPITAL_COMMUNITY)
Admission: RE | Disposition: A | Discharge status: Home / Self Care | Payer: Self-pay | Source: Home / Self Care | Attending: Vascular Surgery

## 2023-05-05 ENCOUNTER — Other Ambulatory Visit: Payer: Self-pay

## 2023-05-05 ENCOUNTER — Inpatient Hospital Stay (HOSPITAL_COMMUNITY)
Admission: RE | Admit: 2023-05-05 | Discharge: 2023-05-06 | DRG: 036 | Disposition: A | Discharge status: Home / Self Care | Attending: Vascular Surgery | Admitting: Vascular Surgery

## 2023-05-05 ENCOUNTER — Inpatient Hospital Stay (HOSPITAL_COMMUNITY): Payer: Self-pay | Admitting: Vascular Surgery

## 2023-05-05 ENCOUNTER — Other Ambulatory Visit (HOSPITAL_COMMUNITY): Payer: Self-pay

## 2023-05-05 ENCOUNTER — Encounter (HOSPITAL_COMMUNITY): Payer: Self-pay | Admitting: Vascular Surgery

## 2023-05-05 DIAGNOSIS — I6522 Occlusion and stenosis of left carotid artery: Principal | ICD-10-CM | POA: Diagnosis present

## 2023-05-05 DIAGNOSIS — Z79899 Other long term (current) drug therapy: Secondary | ICD-10-CM | POA: Diagnosis not present

## 2023-05-05 DIAGNOSIS — Z7902 Long term (current) use of antithrombotics/antiplatelets: Secondary | ICD-10-CM

## 2023-05-05 DIAGNOSIS — Z7982 Long term (current) use of aspirin: Secondary | ICD-10-CM | POA: Diagnosis not present

## 2023-05-05 DIAGNOSIS — E7800 Pure hypercholesterolemia, unspecified: Secondary | ICD-10-CM | POA: Diagnosis present

## 2023-05-05 DIAGNOSIS — E1122 Type 2 diabetes mellitus with diabetic chronic kidney disease: Secondary | ICD-10-CM | POA: Diagnosis not present

## 2023-05-05 DIAGNOSIS — I2510 Atherosclerotic heart disease of native coronary artery without angina pectoris: Secondary | ICD-10-CM

## 2023-05-05 DIAGNOSIS — Z952 Presence of prosthetic heart valve: Secondary | ICD-10-CM | POA: Diagnosis not present

## 2023-05-05 DIAGNOSIS — I129 Hypertensive chronic kidney disease with stage 1 through stage 4 chronic kidney disease, or unspecified chronic kidney disease: Secondary | ICD-10-CM | POA: Diagnosis present

## 2023-05-05 DIAGNOSIS — E039 Hypothyroidism, unspecified: Secondary | ICD-10-CM | POA: Diagnosis present

## 2023-05-05 DIAGNOSIS — Z7951 Long term (current) use of inhaled steroids: Secondary | ICD-10-CM | POA: Diagnosis not present

## 2023-05-05 DIAGNOSIS — N1831 Chronic kidney disease, stage 3a: Secondary | ICD-10-CM | POA: Diagnosis not present

## 2023-05-05 DIAGNOSIS — I10 Essential (primary) hypertension: Secondary | ICD-10-CM | POA: Diagnosis not present

## 2023-05-05 DIAGNOSIS — H9193 Unspecified hearing loss, bilateral: Secondary | ICD-10-CM | POA: Diagnosis present

## 2023-05-05 DIAGNOSIS — Z79890 Hormone replacement therapy (postmenopausal): Secondary | ICD-10-CM

## 2023-05-05 DIAGNOSIS — Z951 Presence of aortocoronary bypass graft: Secondary | ICD-10-CM

## 2023-05-05 DIAGNOSIS — I252 Old myocardial infarction: Secondary | ICD-10-CM | POA: Diagnosis not present

## 2023-05-05 DIAGNOSIS — Z87891 Personal history of nicotine dependence: Secondary | ICD-10-CM | POA: Diagnosis not present

## 2023-05-05 HISTORY — PX: TRANSCAROTID ARTERY REVASCULARIZATIONÂ: SHX6778

## 2023-05-05 LAB — CBC
HCT: 36.8 % (ref 36.0–46.0)
Hemoglobin: 11.2 g/dL — ABNORMAL LOW (ref 12.0–15.0)
MCH: 27.3 pg (ref 26.0–34.0)
MCHC: 30.4 g/dL (ref 30.0–36.0)
MCV: 89.8 fL (ref 80.0–100.0)
Platelets: 300 10*3/uL (ref 150–400)
RBC: 4.1 MIL/uL (ref 3.87–5.11)
RDW: 12.5 % (ref 11.5–15.5)
WBC: 9.4 10*3/uL (ref 4.0–10.5)
nRBC: 0 % (ref 0.0–0.2)

## 2023-05-05 LAB — CREATININE, SERUM
Creatinine, Ser: 1.13 mg/dL — ABNORMAL HIGH (ref 0.44–1.00)
GFR, Estimated: 51 mL/min — ABNORMAL LOW (ref >60)

## 2023-05-05 LAB — ABO/RH: ABO/RH(D): O NEG

## 2023-05-05 LAB — GLUCOSE, CAPILLARY: Glucose-Capillary: 126 mg/dL — ABNORMAL HIGH (ref 70–99)

## 2023-05-05 SURGERY — TRANSCAROTID ARTERY REVASCULARIZATION (TCAR)
Anesthesia: General | Laterality: Left

## 2023-05-05 MED ORDER — DOCUSATE SODIUM 100 MG PO CAPS
100.0000 mg | ORAL_CAPSULE | Freq: Every day | ORAL | Status: DC
  Administered 2023-05-06: 100 mg via ORAL
  Filled 2023-05-05: qty 1

## 2023-05-05 MED ORDER — ORAL CARE MOUTH RINSE: 15.0000 mL | Freq: Once | OROMUCOSAL | Status: AC

## 2023-05-05 MED ORDER — HEPARIN 6000 UNIT IRRIGATION SOLUTION
Status: AC
  Filled 2023-05-05: qty 500

## 2023-05-05 MED ORDER — ISOSORBIDE MONONITRATE ER 30 MG PO TB24
30.0000 mg | ORAL_TABLET | Freq: Every day | ORAL | Status: DC
  Administered 2023-05-06: 30 mg via ORAL
  Filled 2023-05-05 (×2): qty 1

## 2023-05-05 MED ORDER — EPHEDRINE 5 MG/ML INJ
INTRAVENOUS | Status: AC
  Filled 2023-05-05: qty 5

## 2023-05-05 MED ORDER — METOPROLOL TARTRATE 25 MG PO TABS
25.0000 mg | ORAL_TABLET | Freq: Two times a day (BID) | ORAL | Status: DC
  Administered 2023-05-05 – 2023-05-06 (×2): 25 mg via ORAL
  Filled 2023-05-05 (×2): qty 1

## 2023-05-05 MED ORDER — SODIUM CHLORIDE 0.9 % IV SOLN: 500.0000 mL | Freq: Once | INTRAVENOUS | Status: DC | PRN

## 2023-05-05 MED ORDER — BISACODYL 5 MG PO TBEC: 5.0000 mg | DELAYED_RELEASE_TABLET | Freq: Every day | ORAL | Status: DC | PRN

## 2023-05-05 MED ORDER — PROPOFOL 10 MG/ML IV BOLUS
INTRAVENOUS | Status: AC
  Filled 2023-05-05: qty 20

## 2023-05-05 MED ORDER — SODIUM CHLORIDE 0.9 % IV SOLN: 250.0000 mL | INTRAVENOUS | Status: DC | PRN

## 2023-05-05 MED ORDER — 0.9 % SODIUM CHLORIDE (POUR BTL) OPTIME
TOPICAL | Status: DC | PRN
  Administered 2023-05-05: 1000 mL

## 2023-05-05 MED ORDER — LABETALOL HCL 5 MG/ML IV SOLN: 10.0000 mg | INTRAVENOUS | Status: DC | PRN

## 2023-05-05 MED ORDER — SUGAMMADEX SODIUM 200 MG/2ML IV SOLN
INTRAVENOUS | Status: DC | PRN
  Administered 2023-05-05: 200 mg via INTRAVENOUS

## 2023-05-05 MED ORDER — LACTATED RINGERS IV SOLN: INTRAVENOUS | Status: DC | PRN

## 2023-05-05 MED ORDER — ROCURONIUM BROMIDE 10 MG/ML (PF) SYRINGE
PREFILLED_SYRINGE | INTRAVENOUS | Status: AC
  Filled 2023-05-05: qty 10

## 2023-05-05 MED ORDER — CEFAZOLIN SODIUM-DEXTROSE 2-4 GM/100ML-% IV SOLN
2.0000 g | INTRAVENOUS | Status: AC
  Administered 2023-05-05: 2 g via INTRAVENOUS
  Filled 2023-05-05: qty 100

## 2023-05-05 MED ORDER — PHENOL 1.4 % MT LIQD: 1.0000 | OROMUCOSAL | Status: DC | PRN

## 2023-05-05 MED ORDER — CLEVIDIPINE BUTYRATE 0.5 MG/ML IV EMUL
INTRAVENOUS | Status: AC
  Filled 2023-05-05: qty 50

## 2023-05-05 MED ORDER — ROCURONIUM BROMIDE 10 MG/ML (PF) SYRINGE
PREFILLED_SYRINGE | INTRAVENOUS | Status: DC | PRN
  Administered 2023-05-05: 20 mg via INTRAVENOUS
  Administered 2023-05-05: 50 mg via INTRAVENOUS
  Administered 2023-05-05: 20 mg via INTRAVENOUS

## 2023-05-05 MED ORDER — FLUTICASONE FUROATE-VILANTEROL 200-25 MCG/ACT IN AEPB: 1.0000 | INHALATION_SPRAY | Freq: Every day | RESPIRATORY_TRACT | Status: DC

## 2023-05-05 MED ORDER — ONDANSETRON HCL 4 MG/2ML IJ SOLN
INTRAMUSCULAR | Status: DC | PRN
  Administered 2023-05-05: 4 mg via INTRAVENOUS

## 2023-05-05 MED ORDER — IODIXANOL 320 MG/ML IV SOLN
INTRAVENOUS | Status: DC | PRN
  Administered 2023-05-05: 25 mL

## 2023-05-05 MED ORDER — POTASSIUM CHLORIDE CRYS ER 20 MEQ PO TBCR: 20.0000 meq | EXTENDED_RELEASE_TABLET | Freq: Every day | ORAL | Status: DC | PRN

## 2023-05-05 MED ORDER — SODIUM CHLORIDE 0.9% FLUSH: 3.0000 mL | INTRAVENOUS | Status: DC | PRN

## 2023-05-05 MED ORDER — CLOPIDOGREL BISULFATE 75 MG PO TABS
75.0000 mg | ORAL_TABLET | Freq: Every day | ORAL | Status: DC
  Administered 2023-05-06: 75 mg via ORAL
  Filled 2023-05-05: qty 1

## 2023-05-05 MED ORDER — SODIUM CHLORIDE 0.9% FLUSH
3.0000 mL | Freq: Two times a day (BID) | INTRAVENOUS | Status: DC
  Administered 2023-05-05 – 2023-05-06 (×2): 3 mL via INTRAVENOUS

## 2023-05-05 MED ORDER — LOSARTAN POTASSIUM 50 MG PO TABS
100.0000 mg | ORAL_TABLET | Freq: Every day | ORAL | Status: DC
  Administered 2023-05-06: 100 mg via ORAL
  Filled 2023-05-05 (×2): qty 2

## 2023-05-05 MED ORDER — ACETAMINOPHEN 650 MG RE SUPP: 325.0000 mg | RECTAL | Status: DC | PRN

## 2023-05-05 MED ORDER — ASPIRIN 81 MG PO TBEC
81.0000 mg | DELAYED_RELEASE_TABLET | Freq: Every morning | ORAL | Status: DC
  Administered 2023-05-06: 81 mg via ORAL
  Filled 2023-05-05: qty 1

## 2023-05-05 MED ORDER — FENTANYL CITRATE (PF) 250 MCG/5ML IJ SOLN
INTRAMUSCULAR | Status: DC | PRN
  Administered 2023-05-05 (×2): 50 ug via INTRAVENOUS
  Administered 2023-05-05: 25 ug via INTRAVENOUS
  Administered 2023-05-05: 50 ug via INTRAVENOUS

## 2023-05-05 MED ORDER — ONDANSETRON HCL 4 MG/2ML IJ SOLN
INTRAMUSCULAR | Status: AC
  Filled 2023-05-05: qty 2

## 2023-05-05 MED ORDER — GUAIFENESIN-DM 100-10 MG/5ML PO SYRP: 15.0000 mL | ORAL_SOLUTION | ORAL | Status: DC | PRN

## 2023-05-05 MED ORDER — HEPARIN 6000 UNIT IRRIGATION SOLUTION
Status: DC | PRN
  Administered 2023-05-05: 1

## 2023-05-05 MED ORDER — OXYCODONE HCL 5 MG PO TABS: 5.0000 mg | ORAL_TABLET | ORAL | Status: DC | PRN

## 2023-05-05 MED ORDER — LIDOCAINE 2% (20 MG/ML) 5 ML SYRINGE
INTRAMUSCULAR | Status: AC
  Filled 2023-05-05: qty 5

## 2023-05-05 MED ORDER — HEMOSTATIC AGENTS (NO CHARGE) OPTIME
TOPICAL | Status: DC | PRN
  Administered 2023-05-05: 1 via TOPICAL

## 2023-05-05 MED ORDER — PHENYLEPHRINE 80 MCG/ML (10ML) SYRINGE FOR IV PUSH (FOR BLOOD PRESSURE SUPPORT)
PREFILLED_SYRINGE | INTRAVENOUS | Status: AC
  Filled 2023-05-05: qty 10

## 2023-05-05 MED ORDER — PHENYLEPHRINE HCL-NACL 20-0.9 MG/250ML-% IV SOLN
INTRAVENOUS | Status: AC
Start: 2023-05-05 — End: ?
  Filled 2023-05-05: qty 500

## 2023-05-05 MED ORDER — PROTAMINE SULFATE 10 MG/ML IV SOLN
INTRAVENOUS | Status: AC
  Filled 2023-05-05: qty 5

## 2023-05-05 MED ORDER — GLYCOPYRROLATE 0.2 MG/ML IJ SOLN
INTRAMUSCULAR | Status: DC | PRN
  Administered 2023-05-05 (×2): .2 mg via INTRAVENOUS

## 2023-05-05 MED ORDER — ACETAMINOPHEN 325 MG PO TABS
325.0000 mg | ORAL_TABLET | ORAL | Status: DC | PRN
  Administered 2023-05-05: 325 mg via ORAL
  Administered 2023-05-06: 650 mg via ORAL
  Filled 2023-05-05 (×2): qty 2

## 2023-05-05 MED ORDER — HEPARIN SODIUM (PORCINE) 1000 UNIT/ML IJ SOLN
INTRAMUSCULAR | Status: DC | PRN
  Administered 2023-05-05: 7000 [IU] via INTRAVENOUS

## 2023-05-05 MED ORDER — HEPARIN SODIUM (PORCINE) 5000 UNIT/ML IJ SOLN
5000.0000 [IU] | Freq: Three times a day (TID) | INTRAMUSCULAR | Status: DC
  Administered 2023-05-06: 5000 [IU] via SUBCUTANEOUS
  Filled 2023-05-05: qty 1

## 2023-05-05 MED ORDER — PHENYLEPHRINE 80 MCG/ML (10ML) SYRINGE FOR IV PUSH (FOR BLOOD PRESSURE SUPPORT)
PREFILLED_SYRINGE | INTRAVENOUS | Status: DC | PRN
  Administered 2023-05-05: 80 ug via INTRAVENOUS
  Administered 2023-05-05: 40 ug via INTRAVENOUS

## 2023-05-05 MED ORDER — ATROPINE SULFATE 0.4 MG/ML IV SOLN
INTRAVENOUS | Status: AC
  Filled 2023-05-05: qty 1

## 2023-05-05 MED ORDER — CHLORHEXIDINE GLUCONATE CLOTH 2 % EX PADS: 6.0000 | MEDICATED_PAD | Freq: Once | CUTANEOUS | Status: DC

## 2023-05-05 MED ORDER — LEVOTHYROXINE SODIUM 25 MCG PO TABS
137.0000 ug | ORAL_TABLET | Freq: Every day | ORAL | Status: DC
  Administered 2023-05-06: 137 ug via ORAL
  Filled 2023-05-05: qty 1

## 2023-05-05 MED ORDER — LIDOCAINE 2% (20 MG/ML) 5 ML SYRINGE
INTRAMUSCULAR | Status: DC | PRN
  Administered 2023-05-05: 40 mg via INTRAVENOUS

## 2023-05-05 MED ORDER — MAGNESIUM SULFATE 2 GM/50ML IV SOLN: 2.0000 g | Freq: Every day | INTRAVENOUS | Status: DC | PRN

## 2023-05-05 MED ORDER — EPHEDRINE SULFATE-NACL 50-0.9 MG/10ML-% IV SOSY
PREFILLED_SYRINGE | INTRAVENOUS | Status: DC | PRN
  Administered 2023-05-05 (×2): 5 mg via INTRAVENOUS
  Administered 2023-05-05 (×2): 10 mg via INTRAVENOUS

## 2023-05-05 MED ORDER — HYDROMORPHONE HCL 1 MG/ML IJ SOLN
0.5000 mg | INTRAMUSCULAR | Status: DC | PRN
  Administered 2023-05-05: 0.5 mg via INTRAVENOUS
  Filled 2023-05-05: qty 0.5

## 2023-05-05 MED ORDER — DEXAMETHASONE SODIUM PHOSPHATE 10 MG/ML IJ SOLN
INTRAMUSCULAR | Status: DC | PRN
  Administered 2023-05-05: 5 mg via INTRAVENOUS

## 2023-05-05 MED ORDER — CEFAZOLIN SODIUM-DEXTROSE 2-4 GM/100ML-% IV SOLN
2.0000 g | Freq: Three times a day (TID) | INTRAVENOUS | Status: AC
  Administered 2023-05-05 (×2): 2 g via INTRAVENOUS
  Filled 2023-05-05 (×2): qty 100

## 2023-05-05 MED ORDER — ATORVASTATIN CALCIUM 40 MG PO TABS
40.0000 mg | ORAL_TABLET | Freq: Every day | ORAL | Status: DC
  Administered 2023-05-05: 40 mg via ORAL
  Filled 2023-05-05: qty 1

## 2023-05-05 MED ORDER — FENTANYL CITRATE (PF) 100 MCG/2ML IJ SOLN: 25.0000 ug | INTRAMUSCULAR | Status: DC | PRN

## 2023-05-05 MED ORDER — DEXAMETHASONE SODIUM PHOSPHATE 10 MG/ML IJ SOLN
INTRAMUSCULAR | Status: AC
  Filled 2023-05-05: qty 1

## 2023-05-05 MED ORDER — HYDRALAZINE HCL 20 MG/ML IJ SOLN: 5.0000 mg | INTRAMUSCULAR | Status: DC | PRN

## 2023-05-05 MED ORDER — DROPERIDOL 2.5 MG/ML IJ SOLN: 0.6250 mg | Freq: Once | INTRAMUSCULAR | Status: DC | PRN

## 2023-05-05 MED ORDER — CHLORHEXIDINE GLUCONATE 0.12 % MT SOLN
15.0000 mL | Freq: Once | OROMUCOSAL | Status: AC
  Administered 2023-05-05: 15 mL via OROMUCOSAL
  Filled 2023-05-05: qty 15

## 2023-05-05 MED ORDER — LACTATED RINGERS IV SOLN: INTRAVENOUS | Status: DC

## 2023-05-05 MED ORDER — SENNOSIDES-DOCUSATE SODIUM 8.6-50 MG PO TABS: 1.0000 | ORAL_TABLET | Freq: Every evening | ORAL | Status: DC | PRN

## 2023-05-05 MED ORDER — METOPROLOL TARTRATE 5 MG/5ML IV SOLN: 2.0000 mg | INTRAVENOUS | Status: DC | PRN

## 2023-05-05 MED ORDER — ALUM & MAG HYDROXIDE-SIMETH 200-200-20 MG/5ML PO SUSP: 15.0000 mL | ORAL | Status: DC | PRN

## 2023-05-05 MED ORDER — PROPOFOL 10 MG/ML IV BOLUS
INTRAVENOUS | Status: DC | PRN
  Administered 2023-05-05: 120 mg via INTRAVENOUS

## 2023-05-05 MED ORDER — FENTANYL CITRATE (PF) 250 MCG/5ML IJ SOLN
INTRAMUSCULAR | Status: AC
  Filled 2023-05-05: qty 5

## 2023-05-05 MED ORDER — PANTOPRAZOLE SODIUM 40 MG PO TBEC
40.0000 mg | DELAYED_RELEASE_TABLET | Freq: Every day | ORAL | Status: DC
  Administered 2023-05-05 – 2023-05-06 (×2): 40 mg via ORAL
  Filled 2023-05-05 (×2): qty 1

## 2023-05-05 MED ORDER — GLYCOPYRROLATE PF 0.2 MG/ML IJ SOSY
PREFILLED_SYRINGE | INTRAMUSCULAR | Status: AC
  Filled 2023-05-05: qty 1

## 2023-05-05 MED ORDER — ONDANSETRON HCL 4 MG/2ML IJ SOLN: 4.0000 mg | Freq: Four times a day (QID) | INTRAMUSCULAR | Status: DC | PRN

## 2023-05-05 MED ORDER — ACETAMINOPHEN 500 MG PO TABS
1000.0000 mg | ORAL_TABLET | Freq: Once | ORAL | Status: AC
  Administered 2023-05-05: 1000 mg via ORAL
  Filled 2023-05-05: qty 2

## 2023-05-05 MED ORDER — SODIUM CHLORIDE 0.9 % IV SOLN: INTRAVENOUS | Status: DC

## 2023-05-05 MED ORDER — AMLODIPINE BESYLATE 10 MG PO TABS
10.0000 mg | ORAL_TABLET | Freq: Every day | ORAL | Status: DC
  Administered 2023-05-06: 10 mg via ORAL
  Filled 2023-05-05: qty 1

## 2023-05-05 MED ORDER — PHENYLEPHRINE HCL-NACL 20-0.9 MG/250ML-% IV SOLN
INTRAVENOUS | Status: DC | PRN
  Administered 2023-05-05: 40 ug/min via INTRAVENOUS

## 2023-05-05 MED ORDER — PROTAMINE SULFATE 10 MG/ML IV SOLN
INTRAVENOUS | Status: DC | PRN
  Administered 2023-05-05: 50 mg via INTRAVENOUS

## 2023-05-05 SURGICAL SUPPLY — 40 items
BAG BANDED W/RUBBER/TAPE 36X54 (MISCELLANEOUS) ×1 IMPLANT
BAG COUNTER SPONGE SURGICOUNT (BAG) ×1 IMPLANT
CANISTER SUCT 3000ML PPV (MISCELLANEOUS) ×1 IMPLANT
CATH BALLN ENROUTE 6X25 (CATHETERS) IMPLANT
CLIP TI MEDIUM 6 (CLIP) ×1 IMPLANT
CLIP TI WIDE RED SMALL 6 (CLIP) ×1 IMPLANT
COVER DOME SNAP 22 D (MISCELLANEOUS) ×1 IMPLANT
COVER PROBE W GEL 5X96 (DRAPES) ×1 IMPLANT
DERMABOND ADVANCED .7 DNX12 (GAUZE/BANDAGES/DRESSINGS) ×1 IMPLANT
DRAPE FEMORAL ANGIO 80X135IN (DRAPES) ×1 IMPLANT
ELECTRODE REM PT RTRN 9FT ADLT (ELECTROSURGICAL) ×1 IMPLANT
GLOVE BIO SURGEON STRL SZ7 (GLOVE) IMPLANT
GLOVE BIO SURGEON STRL SZ7.5 (GLOVE) ×1 IMPLANT
GOWN STRL REUS W/ TWL LRG LVL3 (GOWN DISPOSABLE) ×2 IMPLANT
GOWN STRL REUS W/ TWL XL LVL3 (GOWN DISPOSABLE) ×1 IMPLANT
GUIDEWIRE ENROUTE 0.014 (WIRE) ×1 IMPLANT
HEMOSTAT SNOW SURGICEL 2X4 (HEMOSTASIS) IMPLANT
KIT BASIN OR (CUSTOM PROCEDURE TRAY) ×1 IMPLANT
KIT ENCORE 26 ADVANTAGE (KITS) ×1 IMPLANT
KIT INTRODUCER GALT 7 (INTRODUCER) ×1 IMPLANT
KIT TURNOVER KIT B (KITS) ×1 IMPLANT
NDL HYPO 25GX1X1/2 BEV (NEEDLE) IMPLANT
NEEDLE HYPO 25GX1X1/2 BEV (NEEDLE) IMPLANT
PACK CAROTID (CUSTOM PROCEDURE TRAY) ×1 IMPLANT
POSITIONER HEAD DONUT 9IN (MISCELLANEOUS) ×1 IMPLANT
SET MICROPUNCTURE 5F STIFF (MISCELLANEOUS) ×1 IMPLANT
SHEATH PINNACLE 5F 10CM (SHEATH) IMPLANT
STENT TRANSCAROTID SYSTEM 9X40 (Permanent Stent) IMPLANT
SUT MNCRL AB 4-0 PS2 18 (SUTURE) ×1 IMPLANT
SUT PROLENE 5 0 C 1 24 (SUTURE) ×1 IMPLANT
SUT SILK 2 0 PERMA HAND 18 BK (SUTURE) ×1 IMPLANT
SUT VIC AB 3-0 SH 27X BRD (SUTURE) ×1 IMPLANT
SYR 10ML LL (SYRINGE) ×3 IMPLANT
SYR 20ML LL LF (SYRINGE) ×1 IMPLANT
SYR CONTROL 10ML LL (SYRINGE) IMPLANT
SYSTEM ENROUTE TCAR NEURO PLUS (FILTER) IMPLANT
TOWEL GREEN STERILE (TOWEL DISPOSABLE) ×1 IMPLANT
WATER STERILE IRR 1000ML POUR (IV SOLUTION) ×1 IMPLANT
WIRE AMPLATZ SS-J .035X180CM (WIRE) IMPLANT
WIRE BENTSON .035X145CM (WIRE) ×1 IMPLANT

## 2023-05-05 NOTE — Discharge Instructions (Signed)

## 2023-05-05 NOTE — Anesthesia Postprocedure Evaluation (Signed)
 Anesthesia Post Note  Patient: Tasha Garrett  Procedure(s) Performed: LEFT TRANSCAROTID ARTERY REVASCULARIZATION (TCAR) (Left)     Patient location during evaluation: PACU Anesthesia Type: General Level of consciousness: sedated and patient cooperative Pain management: pain level controlled Vital Signs Assessment: post-procedure vital signs reviewed and stable Respiratory status: spontaneous breathing Cardiovascular status: stable Anesthetic complications: no   No notable events documented.  Last Vitals:  Vitals:   05/05/23 1322 05/05/23 1337  BP:  129/75  Pulse: 62 63  Resp: 19 15  Temp: 36.6 C 36.6 C  SpO2: 98% 97%    Last Pain:  Vitals:   05/05/23 1337  TempSrc: Oral  PainSc: 0-No pain                 Gorman Laughter

## 2023-05-05 NOTE — Transfer of Care (Signed)
 Immediate Anesthesia Transfer of Care Note  Patient: Tasha Garrett  Procedure(s) Performed: LEFT TRANSCAROTID ARTERY REVASCULARIZATION (TCAR) (Left)  Patient Location: PACU  Anesthesia Type:General  Level of Consciousness: awake and alert   Airway & Oxygen Therapy: Patient Spontanous Breathing  Post-op Assessment: Report given to RN and Post -op Vital signs reviewed and stable, MAE x4  Post vital signs: Reviewed and stable  Last Vitals:  Vitals Value Taken Time  BP 126/55 05/05/23 0952  Temp    Pulse 63 05/05/23 0954  Resp 26 05/05/23 0954  SpO2 91 % 05/05/23 0954  Vitals shown include unfiled device data.  Last Pain:  Vitals:   05/05/23 0610  TempSrc:   PainSc: 0-No pain         Complications: No notable events documented.

## 2023-05-05 NOTE — Op Note (Signed)
 Date: May 05, 2023  Preoperative diagnosis: High-grade asymptomatic left internal carotid artery stenosis over 80%  Postoperative diagnosis: Same  Procedure: 1.  Ultrasound-guided access right common femoral vein for delivery of TCAR venous sheath 2.  Transcatheter placement of left cervical carotid artery stent including angioplasty with flow reversal for distal embolic protection (left TCAR)  Surgeon: Dr. [name], MD  Assistant: Wynonia Hedges, PA  Indications: 73 year old female seen with left high-grade asymptomatic carotid stenosis over 80%.  She presents for left TCAR after risks benefits discussed.  Assistant was needed given the complexity of the case and also for wire and sheath exchange.  Findings: TCAR venous sheath was placed in the right common femoral vein.  A transverse incision above the left clavicle with cutdown on the left common carotid artery.  This was accessed percutaneously after a pursestring was placed on the anterior wall.  Once we went on active flow reversal the lesion was crossed and treated with 6 mm x 25 mm angioplasty balloon.  It was then stented with a 9 mm x 40 mm Enroute stent.  Less than 30% residual stenosis.  Anesthesia: General  Details: Patient is taken operating room after informed consent was obtained.  Placed on the operative table in the supine position.  General endotracheal anesthesia was induced.  Bilateral groins and left neck were then prepped and draped in standard sterile fashion.  Antibiotics were given and timeout performed.  Initially started in the right groin where we evaluated the femoral vein with ultrasound, it was patent, an image was saved.  This was accessed with micro access needle and placed a microwire and then a micro sheath.  I then advanced a Bentson wire.  In the process of trying to advance the TCAR venous sheath the wire was kinked.  We could not advance the sheath.  Ultimately used a 5 [place] sheath and  exchanged for an Amplatz wire for more support and then we were able to place the venous sheath in the right common femoral vein and this flushed easily. We then turned our attention to the left neck where a transverse incision was made 1 fingerbreadth above the clavicle.  Dissected down with Bovie cautery through the platysma and then divided between the heads of the sternocleidomastoid.  We visualized the internal jugular vein and this was mobilized laterally.  The common carotid was identified and this was dissected out with preservation of the vagus nerve.  I placed an umbilical tape and a large vessel loop.  Patient is given 100 units/kg IV heparin .  ACT was checked to maintain greater than 250.  5-0 U-stitch was placed on the anterior wall the common carotid.  This was accessed with micro access needle placed a microwire and micro sheath.  We got a left carotid angiogram.  I then advanced the J-wire below the carotid bifurcation and then the arterial working sheath was placed in the left common carotid artery.  We then connected the filter to the groin and were on passive flow reversal.  We performed a TCAR timeout.  Glycopyrrolate  was given.  We got a more left oblique angle to identify the carotid bifurcation and this was marked.  We then went on active flow reversal with clamping of the common carotid artery and made sure we had good flow through the sheath.  The lesion was then crossed with a wire into the internal carotid artery and then treated with a 6 mm x 25 mm angioplasty balloon in the  proximal ICA.  It was then stented with a 9 mm x 40 mm Enroute stent with the help of my assistant.  We allowed 2 minutes of flow reversal after this.  Final imaging in multiple views showed no dissection with less than 30% residual stenosis and appropriate treatment of the lesion.  Wires were removed and we came off active clamp.  Arterial sheath was removed and we tied down the pursestring with good hemostasis.   Doppler confirmed flow.  Protamine  was given.  The neck was irrigated out with use Surgicel snow.  Was closed the platysma with 3-0 Vicryl and the skin with 4-0 Monocryl and Dermabond.  The femoral sheath was removed and we held manual pressure with good hemostasis.  Complication: None  Condition: Stable  [name], MD [facility] Office: [phone]   [name]

## 2023-05-05 NOTE — H&P (Signed)
 I connected with Tasha Garrett on 03/23/2023 using the [employer] by telephone and verified that I was speaking with the correct person using two identifiers. Patient was located in her car with daughter. I am located at [facility] office.   The limitations of evaluation and management by telemedicine and the availability of in person appointments have been previously discussed with the patient and are documented in the patients chart. The patient expressed understanding and consented to proceed.   PCP: Zilphia Hilt, Charyl Coppersmith, MD   Chief Complaint: Discuss left carotid revascularization   History of Present Illness: Tasha Garrett is a 73 y.o. female with with history of CKD, CAD status post CABG, hypertension, hyperlipidemia that presents for phone follow-up to discuss left carotid revascularization.  She had a carotid duplex showing greater than 70% left ICA stenosis in [place] .  I sent her for CTA.  This showed a over 80% calcified left internal carotid stenosis.  She wanted to think about carotid endarterectomy versus TCAR.       Past Medical History:  Diagnosis Date   Blood in stool     Carotid artery occlusion     Chronic kidney disease     High cholesterol     Hypertension     Hypothyroid     Urinary tract infection                 Past Surgical History:  Procedure Laterality Date   ABDOMINAL HYSTERECTOMY       AORTIC VALVE REPLACEMENT (AVR)/CORONARY ARTERY BYPASS GRAFTING (CABG)              Active Medications  No outpatient medications have been marked as taking for the 03/23/23 encounter (Appointment) with [name], MD.        12 system ROS was negative unless otherwise noted in HPI   Observations/Objective:   CTA neck 02/26/2023 shows a high-grade over 80% calcified left internal carotid stenosis   Carotid duplex today shows 1 to 39% right ICA stenosis and 60 to 79% left ICA stenosis with velocity of 357/82 (previously 246/75)    Assessment and Plan:   73 y.o. female, with history of CKD, CAD s/p CABG, hypertension, hyperlipidemia that presents for phone follow-up after CTA neck to discuss left carotid revascularization.  Discussed she has a high-grade left ICA stenosis on CTA.  We previously discussed carotid endarterectomy versus TCAR.  She is very worried about anesthesia and the risk of waking up.  I discussed again the option of carotid endarterectomy versus TCAR.  She is favoring a more minimal invasive approach.  Will plan left TCAR for stroke risk reduction.  I did send Plavix  to her pharmacy and will need aspirin  Plavix  plus statin.  I discussed she does not stop this around the time of surgery.  My office will call to schedule.  I discussed 1% perioperative stroke risk.     Follow Up Instructions:    Follow up: Schedule left TCAR   I discussed the assessment and treatment plan with the patient. The patient was provided an opportunity to ask questions and all were answered. The patient agreed with the plan and demonstrated an understanding of the instructions.   The patient was advised to call back or seek an in-person evaluation if the symptoms worsen or if the condition fails to improve as anticipated.   I spent 10 minutes with the patient via telephone encounter.     Signed, [name]  [facility]  Office: [phone]  [name], MD [facility] Office: [phone]   [name]

## 2023-05-05 NOTE — Anesthesia Procedure Notes (Signed)
 Arterial Line Insertion Start/End4/23/2025 7:10 AM, 05/05/2023 7:15 AM Performed by: Alphia Jasmine, CRNA, CRNA  Patient location: Pre-op. Preanesthetic checklist: patient identified, IV checked, site marked, risks and benefits discussed, surgical consent, monitors and equipment checked, pre-op evaluation, timeout performed and anesthesia consent Lidocaine  1% used for infiltration Right, radial was placed Catheter size: 20 G  Attempts: 1 Procedure performed without using ultrasound guided technique. Following insertion, dressing applied and Biopatch. Patient tolerated the procedure well with no immediate complications. Additional procedure comments: Inserted by student Terrell Ferris SRNA.

## 2023-05-05 NOTE — Anesthesia Procedure Notes (Signed)
 Procedure Name: Intubation Date/Time: 05/05/2023 7:53 AM  Performed by: Neomia Banner, RNPre-anesthesia Checklist: Patient identified, Emergency Drugs available, Suction available and Patient being monitored Patient Re-evaluated:Patient Re-evaluated prior to induction Oxygen Delivery Method: Circle system utilized Preoxygenation: Pre-oxygenation with 100% oxygen Induction Type: IV induction Ventilation: Mask ventilation without difficulty and Oral airway inserted - appropriate to patient size Laryngoscope Size: Mac and 3 Grade View: Grade I Tube type: Oral Tube size: 7.0 mm Number of attempts: 1 Airway Equipment and Method: Stylet and Oral airway Placement Confirmation: ETT inserted through vocal cords under direct vision, positive ETCO2 and breath sounds checked- equal and bilateral Secured at: 21 cm Tube secured with: Tape Dental Injury: Teeth and Oropharynx as per pre-operative assessment

## 2023-05-05 NOTE — Progress Notes (Signed)
  Day of Surgery Note    Subjective:  resting comfortably in recovery; no complaints   Vitals:   05/05/23 1230 05/05/23 1245  BP: (!) 122/52 (!) 122/55  Pulse: (!) 56 60  Resp: 15 16  Temp:    SpO2: 91% (!) 89%    Incisions:   left neck incision is clean and dry; right groin soft without hematoma Neuro:  moving all extremities equally; tongue is midline   Cardiac:  regular Lungs:  non labored    Assessment/Plan:  This is a 73 y.o. female who is s/p  Left TCAR for asymptomatic carotid artery stenosis  -pt doing well in recovery and neuro in tact.  Incision looks good without hematoma -transfer to floor shortly.   -anticipate discharge tomorrow if no events overnight.  -asa/statin/plavix  reordered for transfer to the floor   Maryanna Smart, PA-C 05/05/2023 1:20 PM [phone]

## 2023-05-06 ENCOUNTER — Encounter (HOSPITAL_COMMUNITY): Payer: Self-pay | Admitting: Vascular Surgery

## 2023-05-06 LAB — LIPID PANEL
Cholesterol: 110 mg/dL (ref 0–200)
HDL: 24 mg/dL — ABNORMAL LOW (ref >40)
LDL Cholesterol: 64 mg/dL (ref 0–99)
Total CHOL/HDL Ratio: 4.6 ratio
Triglycerides: 111 mg/dL (ref <150)
VLDL: 22 mg/dL (ref 0–40)

## 2023-05-06 LAB — BASIC METABOLIC PANEL WITH GFR
Anion gap: 9 (ref 5–15)
BUN: 30 mg/dL — ABNORMAL HIGH (ref 8–23)
CO2: 19 mmol/L — ABNORMAL LOW (ref 22–32)
Calcium: 10.1 mg/dL (ref 8.9–10.3)
Chloride: 108 mmol/L (ref 98–111)
Creatinine, Ser: 1.21 mg/dL — ABNORMAL HIGH (ref 0.44–1.00)
GFR, Estimated: 47 mL/min — ABNORMAL LOW (ref >60)
Glucose, Bld: 135 mg/dL — ABNORMAL HIGH (ref 70–99)
Potassium: 4.8 mmol/L (ref 3.5–5.1)
Sodium: 136 mmol/L (ref 135–145)

## 2023-05-06 LAB — CBC
HCT: 33 % — ABNORMAL LOW (ref 36.0–46.0)
Hemoglobin: 10.4 g/dL — ABNORMAL LOW (ref 12.0–15.0)
MCH: 28.3 pg (ref 26.0–34.0)
MCHC: 31.5 g/dL (ref 30.0–36.0)
MCV: 89.9 fL (ref 80.0–100.0)
Platelets: 294 10*3/uL (ref 150–400)
RBC: 3.67 MIL/uL — ABNORMAL LOW (ref 3.87–5.11)
RDW: 12.5 % (ref 11.5–15.5)
WBC: 10.8 10*3/uL — ABNORMAL HIGH (ref 4.0–10.5)
nRBC: 0 % (ref 0.0–0.2)

## 2023-05-06 LAB — POCT ACTIVATED CLOTTING TIME: Activated Clotting Time: 279 s

## 2023-05-06 MED ORDER — ATORVASTATIN CALCIUM 80 MG PO TABS: 80.0000 mg | ORAL_TABLET | Freq: Every day | ORAL | 3 refills | Status: DC

## 2023-05-06 MED ORDER — OXYCODONE-ACETAMINOPHEN 5-325 MG PO TABS: 1.0000 | ORAL_TABLET | Freq: Four times a day (QID) | ORAL | 0 refills | Status: DC | PRN

## 2023-05-06 MED ORDER — ATORVASTATIN CALCIUM 80 MG PO TABS: 80.0000 mg | ORAL_TABLET | Freq: Every day | ORAL | Status: DC

## 2023-05-06 NOTE — Progress Notes (Signed)
 Mobility Specialist Progress Note:    05/06/23 0914  Mobility  Activity Ambulated independently in hallway;Ambulated independently in room  Level of Assistance Standby assist, set-up cues, supervision of patient - no hands on  Assistive Device None  Distance Ambulated (ft) 470 ft  Activity Response Tolerated well  Mobility Referral Yes  Mobility visit 1 Mobility  Mobility Specialist Start Time (ACUTE ONLY) 0914  Mobility Specialist Stop Time (ACUTE ONLY) 0919  Mobility Specialist Time Calculation (min) (ACUTE ONLY) 5 min   Pt received in bed, agreeable to mobility session. Ambulated in hallway, no AD required, SV for safety. Tolerated well, HR in 80's throughout session. Returned pt to room, left with all needs met. Eager for d/c.   Cyril Woodmansee Mobility Specialist Please contact via SecureChat or  Rehab office at [phone]

## 2023-05-06 NOTE — TOC Transition Note (Signed)
 Transition of Care [facility]) - Discharge Note   Patient Details  Name: Tasha Garrett MRN: [id number] Date of Birth: [birth year]  Transition of Care [facility]) CM/SW Contact:  Eusebio High, RN Phone Number: 05/06/2023, 9:31 AM   Clinical Narrative:     Patient will DC to home today. No TOC needs identified. Patient will follow up as directed on AVS    Family to transport           Patient Goals and CMS Choice            Discharge Placement                       Discharge Plan and Services Additional resources added to the After Visit Summary for                                       Social Drivers of Health (SDOH) Interventions SDOH Screenings   Food Insecurity: Food Insecurity Present (05/05/2023)  Housing: Low Risk  (05/05/2023)  Transportation Needs: No Transportation Needs (05/05/2023)  Utilities: Not At Risk (05/05/2023)  Alcohol Screen: Low Risk  (05/19/2022)  Depression (PHQ2-9): Low Risk  (12/21/2022)  Financial Resource Strain: Low Risk  (03/18/2023)  Physical Activity: Insufficiently Active (03/18/2023)  Social Connections: Moderately Integrated (05/05/2023)  Stress: Stress Concern Present (03/18/2023)  Tobacco Use: Medium Risk (05/05/2023)     Readmission Risk Interventions     No data to display

## 2023-05-06 NOTE — Progress Notes (Addendum)
  Progress Note    05/06/2023 6:48 AM 1 Day Post-Op  Subjective:  standing at sink washing up.  No complaints.   She has walked, and voided.  No trouble swallowing.  Wants to go home.   Afebrile HR 50's-70's  110's-120's systolic 94% RA  Gtts:  none   Vitals:   05/06/23 0328 05/06/23 0400  BP: 139/60 (!) 127/53  Pulse: 73 69  Resp: 18 14  Temp: 97.6 F (36.4 C)   SpO2: 95% 95%     Physical Exam: Neuro:  in tact Lungs:  non labored Incision:  clean and dry  CBC    Component Value Date/Time   WBC 10.8 (H) 05/06/2023 0415   RBC 3.67 (L) 05/06/2023 0415   HGB 10.4 (L) 05/06/2023 0415   HCT 33.0 (L) 05/06/2023 0415   PLT 294 05/06/2023 0415   MCV 89.9 05/06/2023 0415   MCH 28.3 05/06/2023 0415   MCHC 31.5 05/06/2023 0415   RDW 12.5 05/06/2023 0415   LYMPHSABS 1.8 05/20/2022 1114   MONOABS 0.7 05/20/2022 1114   EOSABS 0.5 05/20/2022 1114   BASOSABS 0.1 05/20/2022 1114    BMET    Component Value Date/Time   NA 136 05/06/2023 0415   NA 143 05/27/2021 0000   K 4.8 05/06/2023 0415   CL 108 05/06/2023 0415   CO2 19 (L) 05/06/2023 0415   GLUCOSE 135 (H) 05/06/2023 0415   BUN 30 (H) 05/06/2023 0415   CREATININE 1.21 (H) 05/06/2023 0415   CALCIUM  10.1 05/06/2023 0415   GFRNONAA 47 (L) 05/06/2023 0415     Intake/Output Summary (Last 24 hours) at 05/06/2023 0648 Last data filed at 05/06/2023 0524 Gross per 24 hour  Intake 1350 ml  Output 930 ml  Net 420 ml     Assessment/Plan:  This is a 73 y.o. female who is s/p left TCAR for asymptomatic carotid artery stenosis  1 Day Post-Op  -pt is doing well this am. -pt neuro exam is in tact -creatinine up slightly 1.2 today from 1.1 yesterday but appears to be at baseline from previous admissions -pt has ambulated -pt has voided -continue asa/statin/plavix  -f/u with [facility] in 4 weeks with carotid duplex on Dr. Fulton Job clinic day.   Maryanna Smart, PA-C [facility] [phone]   I have seen  and evaluated the patient. I agree with the PA note as documented above.  Postop day 1 status post left TCAR for asymptomatic high-grade stenosis.  Looks good this a.m.  Grossly neurologically intact.  Left neck incision looks good.  Plan discharge with 1 month follow-up with carotid duplex.  Plan aspirin  Plavix  statin as we discussed.  Has some CKD and creatinine around baseline.  [name], MD [facility] [place] Office: [phone]

## 2023-05-06 NOTE — Progress Notes (Signed)
 Patient alert and oriented , verbalized understanding of dc instructions. All belongings and paperwork given to patient. Patient will be transferred to the dc lounge to await ride.

## 2023-05-06 NOTE — Discharge Summary (Signed)
 Discharge Summary     Tasha Garrett 03-23-1950 73 y.o. female  [id number]  Admission Date: 05/05/2023  Discharge Date: 05/10/2023  Physician: No att. providers found  Admission Diagnosis: Stenosis of left carotid artery [I65.22] Asymptomatic stenosis of left carotid artery [I65.22]   HPI:   This is a 73 y.o. female seen with left high-grade asymptomatic carotid stenosis over 80%.  She presents for left TCAR after risks benefits discussed.  Assistant was needed given the complexity of the case and also for wire and sheath exchange.   Hospital Course:  The patient was admitted to the hospital and taken to the operating room on 05/05/2023 and underwent 1.  Ultrasound-guided access right common femoral vein for delivery of TCAR venous sheath 2.  Transcatheter placement of left cervical carotid artery stent including angioplasty with flow reversal for distal embolic protection (left TCAR)      Findings:  TCAR venous sheath was placed in the right common femoral vein.  A transverse incision above the left clavicle with cutdown on the left common carotid artery.  This was accessed percutaneously after a pursestring was placed on the anterior wall.  Once we went on active flow reversal the lesion was crossed and treated with 6 mm x 25 mm angioplasty balloon.  It was then stented with a 9 mm x 40 mm Enroute stent.  Less than 30% residual stenosis.   The pt tolerated the procedure well and was transported to the PACU in good condition.  Postop day 1 status post left TCAR for asymptomatic high-grade stenosis. Looks good this a.m. Grossly neurologically intact. Left neck incision looks good. Plan discharge with 1 month follow-up with carotid duplex. Plan aspirin  Plavix  statin as we discussed. Has some CKD and creatinine around baseline.    No results for input(s): "NA", "K", "CL", "CO2", "GLUCOSE", "BUN", "CALCIUM " in the last 72 hours.  Invalid input(s): "CRATININE"  No results for  input(s): "WBC", "HGB", "HCT", "PLT" in the last 72 hours.  No results for input(s): "INR" in the last 72 hours.   Discharge Instructions     Discharge patient   Complete by: As directed    Discharge pt after she has eaten breakfast and been seen by Dr. Fulton Job.  Thanks   Discharge disposition: 01-Home or Self Care   Discharge patient date: 05/06/2023       Discharge Diagnosis:  Stenosis of left carotid artery [I65.22] Asymptomatic stenosis of left carotid artery [I65.22]  Secondary Diagnosis: Patient Active Problem List   Diagnosis Date Noted   Asymptomatic stenosis of left carotid artery 05/05/2023   Bilateral hearing loss 09/21/2022   Abnormal SPEP 09/21/2022   Hearing loss 09/21/2022   Carotid stenosis 08/11/2022   Vitamin D  deficiency 06/01/2022   Microalbuminuria due to type 2 diabetes mellitus (HCC) 05/12/2021   DM (diabetes mellitus), type 2 (HCC) 05/08/2021   Chronic kidney disease, stage 3 (HCC) 04/04/2019   Coronary arteriosclerosis 04/04/2019   Essential hypertension 04/04/2019   Hx of non-ST elevation myocardial infarction (NSTEMI) 05/14/2011   Hyperlipidemia with target LDL less than 70 05/14/2011   Hypothyroid 05/14/2011   Past Medical History:  Diagnosis Date   Arthritis    Blood in stool    Carotid artery occlusion    Chronic kidney disease    stage 3A   Coronary arteriosclerosis    Diabetes mellitus without complication (HCC)    type 2, no meds, does not check blood sugar per pt on 04/27/23   High cholesterol  Hypertension    Hypothyroid    Myocardial infarction (HCC) 2012   in [place]   Urinary tract infection     Allergies as of 05/06/2023   No Known Allergies      Medication List     TAKE these medications    amLODipine  10 MG tablet Commonly known as: NORVASC  TAKE 1 TABLET EVERY DAY   aspirin  EC 81 MG tablet Take 81 mg by mouth in the morning.   atorvastatin  80 MG tablet Commonly known as: LIPITOR Take 1 tablet (80  mg total) by mouth at bedtime. What changed:  medication strength how much to take   brimonidine  0.2 % ophthalmic solution Commonly known as: ALPHAGAN  Place 1 drop into both eyes 2 (two) times daily.   brimonidine  0.2 % ophthalmic solution Commonly known as: ALPHAGAN  Place 1 drop into both eyes 2 (two) times daily 12 hr apart.   clopidogrel  75 MG tablet Commonly known as: PLAVIX  Take 1 tablet (75 mg total) by mouth daily.   Cod Liver Oil Caps Take 1 capsule by mouth in the morning.   cyclobenzaprine  5 MG tablet Commonly known as: FLEXERIL  Take 1 tablet (5 mg total) by mouth at bedtime as needed.   diclofenac  Sodium 1 % Gel Commonly known as: VOLTAREN  APPLY 2 GRAMS TOPICALLY 4 TIMES DAILY What changed: See the new instructions.   isosorbide  mononitrate 30 MG 24 hr tablet Commonly known as: IMDUR  Take 1 tablet (30 mg total) by mouth daily.   latanoprost  0.005 % ophthalmic solution Commonly known as: XALATAN  Place 1 drop into both eyes at bedtime.   latanoprost  0.005 % ophthalmic solution Commonly known as: XALATAN  Place 1 drop into both eyes at bedtime.   levothyroxine  137 MCG tablet Commonly known as: SYNTHROID  Take 1 tablet (137 mcg total) by mouth daily.   losartan  100 MG tablet Commonly known as: COZAAR  Take 1 tablet (100 mg total) by mouth daily.   metoprolol  tartrate 25 MG tablet Commonly known as: LOPRESSOR  TAKE 1 TABLET TWICE DAILY   multivitamin with minerals Tabs tablet Take 1 tablet by mouth in the morning.   oxyCODONE -acetaminophen  5-325 MG tablet Commonly known as: Percocet Take 1 tablet by mouth every 6 (six) hours as needed for severe pain (pain score 7-10).   Symbicort  160-4.5 MCG/ACT inhaler Generic drug: budesonide -formoterol  INHALE 2 PUFFS INTO THE LUNGS IN THE MORNING AND AT BEDTIME.         [facility] Discharge Instructions Carotid Endarterectomy (CEA)  Please refer to the following  instructions for your post-procedure care. Your surgeon or physician assistant will discuss any changes with you.  Activity  You are encouraged to walk as much as you can. You can slowly return to normal activities but must avoid strenuous activity and heavy lifting until your doctor tell you it's OK. Avoid activities such as vacuuming or swinging a golf club. You can drive after one week if you are comfortable and you are no longer taking prescription pain medications. It is normal to feel tired for serval weeks after your surgery. It is also normal to have difficulty with sleep habits, eating, and bowel movements after surgery. These will go away with time.  Bathing/Showering  You may shower after you come home. Do not soak in a bathtub, hot tub, or swim until the incision heals completely.  Incision Care  Shower every day. Clean your incision with mild soap and water. Pat the area dry with a clean towel. You  do not need a bandage unless otherwise instructed. Do not apply any ointments or creams to your incision. You may have skin glue on your incision. Do not peel it off. It will come off on its own in about one week. Your incision may feel thickened and raised for several weeks after your surgery. This is normal and the skin will soften over time. For Men Only: It's OK to shave around the incision but do not shave the incision itself for 2 weeks. It is common to have numbness under your chin that could last for several months.  Diet  Resume your normal diet. There are no special food restrictions following this procedure. A low fat/low cholesterol diet is recommended for all patients with vascular disease. In order to heal from your surgery, it is CRITICAL to get adequate nutrition. Your body requires vitamins, minerals, and protein. Vegetables are the best source of vitamins and minerals. Vegetables also provide the perfect balance of protein. Processed food has little nutritional value, so try  to avoid this.  Medications  Resume taking all of your medications unless your doctor or physician assistant tells you not to.  If your incision is causing pain, you may take over-the- counter pain relievers such as acetaminophen  (Tylenol ). If you were prescribed a stronger pain medication, please be aware these medications can cause nausea and constipation.  Prevent nausea by taking the medication with a snack or meal. Avoid constipation by drinking plenty of fluids and eating foods with a high amount of fiber, such as fruits, vegetables, and grains.  Do not take Tylenol  if you are taking prescription pain medications.  Follow Up  Our office will schedule a follow up appointment 2-3 weeks following discharge.  Please call us  immediately for any of the following conditions  Increased pain, redness, drainage (pus) from your incision site. Fever of 101 degrees or higher. If you should develop stroke (slurred speech, difficulty swallowing, weakness on one side of your body, loss of vision) you should call 911 and go to the nearest emergency room.  Reduce your risk of vascular disease:  Stop smoking. If you would like help call [facility] at 1-800-QUIT-NOW (581-014-2793) or [place] at 6806750949. Manage your cholesterol Maintain a desired weight Control your diabetes Keep your blood pressure down  If you have any questions, please call the office at [phone].  Prescriptions given: 1.   Roxicet #8 No Refill  Disposition: home  Patient's condition: is Good  Follow up: 1. [facility] in 4 weeks with carotid duplex on Dr. Fulton Job clinic day   Maryanna Smart, PA-C [facility] [phone]   --- For [facility] Registry use ---   Modified Rankin score at D/C (0-6): 0  IV medication needed for:  1. Hypertension: No 2. Hypotension: No  Post-op Complications: No  1. Post-op CVA or TIA: No  If yes: Event classification (right eye, left eye, right cortical, left  cortical, verterobasilar, other): n/a  If yes: Timing of event (intra-op, <6 hrs post-op, >=6 hrs post-op, unknown): n/a  2. CN injury: No  If yes: CN n/a injuried   3. Myocardial infarction: No  If yes: Dx by (EKG or clinical, Troponin): n/a  4.  CHF: No  5.  Dysrhythmia (new): No  6. Wound infection: No  7. Reperfusion symptoms: No  8. Return to OR: No  If yes: return to OR for (bleeding, neurologic, other CEA incision, other): n/a  Discharge medications: Statin use:  Yes ASA use:  Yes  Beta blocker use:  Yes ACE-Inhibitor use:  No  ARB use:  Yes CCB use: Yes P2Y12 Antagonist use: Yes, [x ] Plavix , [ ]  Plasugrel, [ ]  Ticlopinine, [ ]  Ticagrelor, [ ]  Other, [ ]  No for medical reason, [ ]  Non-compliant, [ ]  Not-indicated Anti-coagulant use:  No, [ ]  Warfarin, [ ]  Rivaroxaban, [ ]  Dabigatran,

## 2023-05-06 NOTE — Progress Notes (Signed)
 PHARMACIST LIPID MONITORING   Tasha Garrett is a 73 y.o. female admitted on 05/05/2023 with CEA.  Pharmacy has been consulted to optimize lipid-lowering therapy with the indication of secondary prevention for clinical ASCVD.  Recent Labs:  Lipid Panel (last 6 months):   Lab Results  Component Value Date   CHOL 110 05/06/2023   TRIG 111 05/06/2023   HDL 24 (L) 05/06/2023   CHOLHDL 4.6 05/06/2023   VLDL 22 05/06/2023   LDLCALC 64 05/06/2023    Hepatic function panel (last 6 months):   Lab Results  Component Value Date   AST 21 04/27/2023   ALT 17 04/27/2023   ALKPHOS 103 04/27/2023   BILITOT 0.6 04/27/2023    SCr (since admission):   Serum creatinine: 1.21 mg/dL (H) 16/10/96 0454 Estimated creatinine clearance: 36.9 mL/min (A)  Current therapy and lipid therapy tolerance Current lipid-lowering therapy: atorvastatin  40mg  Previous lipid-lowering therapies (if applicable): n/a Documented or reported allergies or intolerances to lipid-lowering therapies (if applicable): n/a  Assessment:   Patient agrees with changes to lipid-lowering therapy  Plan:    1.Statin intensity (high intensity recommended for all patients regardless of the LDL):  Add or increase statin to high intensity.  2.Add ezetimibe (if any one of the following):   Not indicated at this time.  3.Refer to lipid clinic:   No  4.Follow-up with:  Primary care provider - Zilphia Hilt, Charyl Coppersmith, MD  5.Follow-up labs after discharge:  Changes in lipid therapy were made. Check a lipid panel in 8-12 weeks then annually.       Sheron Dixons, PharmD 05/06/2023, 7:09 AM

## 2023-05-07 ENCOUNTER — Telehealth: Payer: Self-pay

## 2023-05-07 NOTE — Transitions of Care (Post Inpatient/ED Visit) (Signed)
 05/07/2023  Name: Tasha Garrett MRN: [id number] DOB: 10-14-1950  Today's TOC FU Call Status: Today's TOC FU Call Status:: Successful TOC FU Call Completed TOC FU Call Complete Date: 05/07/23 Patient's Name and Date of Birth confirmed.  Transition Care Management Follow-up Telephone Call Date of Discharge: 05/06/23 Discharge Facility: [facility]) Type of Discharge: Inpatient Admission Primary Inpatient Discharge Diagnosis:: Asymptomatic stenosis of left carotid artery How have you been since you were released from the hospital?: Better (reports that she feels better. No new concerns) Any questions or concerns?: No  Items Reviewed: Did you receive and understand the discharge instructions provided?: Yes Medications obtained,verified, and reconciled?: Yes (Medications Reviewed) Any new allergies since your discharge?: No Dietary orders reviewed?: NA Do you have support at home?: Yes Name of Support/Comfort Primary Source: granddaughter  Medications Reviewed Today: Medications Reviewed Today     Reviewed by Vanetta Generous, RN (Registered Nurse) on 05/07/23 at 1023  Med List Status: <None>   Medication Order Taking? Sig Documenting Provider Last Dose Status Informant  amLODipine  (NORVASC ) 10 MG tablet [id number] Yes TAKE 1 TABLET EVERY DAY Zilphia Hilt, Charyl Coppersmith, MD Taking Active Self  aspirin  EC 81 MG tablet [id number] Yes Take 81 mg by mouth in the morning. [provider] Taking Active Self  atorvastatin  (LIPITOR) 80 MG tablet [id number] Yes Take 1 tablet (80 mg total) by mouth at bedtime. Rhyne, Samantha J, PA-C Taking Active   brimonidine  (ALPHAGAN ) 0.2 % ophthalmic solution [id number] Yes Place 1 drop into both eyes 2 (two) times daily. [provider] Taking Active Self  brimonidine  (ALPHAGAN ) 0.2 % ophthalmic solution [id number] Yes Place 1 drop into both eyes 2 (two) times daily 12 hr apart.  Taking Active   clopidogrel  (PLAVIX ) 75 MG tablet [id number] Yes  Take 1 tablet (75 mg total) by mouth daily. [name], MD Taking Active Self           Med Note Raquel Cables, Sherrilyn Doll T   Wed Apr 21, 2023  2:23 PM) Newly prescribed patient has not started will begin 7 days prior to procedure.  Cod Liver Oil CAPS [id number] Yes Take 1 capsule by mouth in the morning. [provider] Taking Active Self  cyclobenzaprine  (FLEXERIL ) 5 MG tablet [id number] Yes Take 1 tablet (5 mg total) by mouth at bedtime as needed. Viola Greulich, MD Taking Active Self  diclofenac  Sodium (VOLTAREN ) 1 % GEL [id number] Yes APPLY 2 GRAMS TOPICALLY 4 TIMES DAILY  Patient taking differently: Apply 2 g topically 4 (four) times daily as needed (pain.).   Zilphia Hilt, Charyl Coppersmith, MD Taking Active Self  isosorbide  mononitrate (IMDUR ) 30 MG 24 hr tablet [id number] Yes Take 1 tablet (30 mg total) by mouth daily. Zilphia Hilt, Charyl Coppersmith, MD Taking Active Self  latanoprost  (XALATAN ) 0.005 % ophthalmic solution [id number] Yes Place 1 drop into both eyes at bedtime. [provider] Taking Active Self  latanoprost  (XALATAN ) 0.005 % ophthalmic solution [id number] Yes Place 1 drop into both eyes at bedtime.  Taking Active   levothyroxine  (SYNTHROID ) 137 MCG tablet [id number] Yes Take 1 tablet (137 mcg total) by mouth daily. Zilphia Hilt, Charyl Coppersmith, MD Taking Active Self  losartan  (COZAAR ) 100 MG tablet [id number] Yes Take 1 tablet (100 mg total) by mouth daily. Zilphia Hilt, Charyl Coppersmith, MD Taking Active Self  metoprolol  tartrate (LOPRESSOR ) 25 MG tablet [id number] Yes TAKE 1 TABLET TWICE DAILY Zilphia Hilt, Charyl Coppersmith, MD Taking Active Self  Multiple Vitamin (MULTIVITAMIN WITH MINERALS) TABS  tablet [id number] Yes Take 1 tablet by mouth in the morning. [provider] Taking Active Self  oxyCODONE -acetaminophen  (PERCOCET) 5-325 MG tablet [id number] Yes Take 1 tablet by mouth every 6 (six) hours as needed for severe pain (pain score 7-10). Rhyne, Samantha J, PA-C Taking  Active   SYMBICORT  160-4.5 MCG/ACT inhaler [id number] Yes INHALE 2 PUFFS INTO THE LUNGS IN THE MORNING AND AT BEDTIME.  Patient taking differently: Inhale 2 puffs into the lungs 2 (two) times daily as needed (respiratory issues.).   Hunsucker, Archer Kobs, MD Taking Active Self            Home Care and Equipment/Supplies: Were Home Health Services Ordered?: No Any new equipment or medical supplies ordered?: No  Functional Questionnaire: Do you need assistance with bathing/showering or dressing?: No Do you need assistance with meal preparation?: No Do you need assistance with eating?: No Do you have difficulty maintaining continence: No Do you need assistance with getting out of bed/getting out of a chair/moving?: No Do you have difficulty managing or taking your medications?: No  Follow up appointments reviewed: PCP Follow-up appointment confirmed?: No (patient will call today and make an appointment) MD Provider Line Number:[phone] Given: No Specialist Hospital Follow-up appointment confirmed?: No Reason Specialist Follow-Up Not Confirmed: Patient has Specialist Provider Number and will Call for Appointment Do you need transportation to your follow-up appointment?: No Do you understand care options if your condition(s) worsen?: Yes-patient verbalized understanding  SDOH Interventions Today    Flowsheet Row Most Recent Value  SDOH Interventions   Food Insecurity Interventions Intervention Not Indicated  Housing Interventions Intervention Not Indicated  Transportation Interventions Intervention Not Indicated  Utilities Interventions Intervention Not Indicated     Patient reports that she is doing very well. Reports no concerns today.  Reviewed all discharge instructions and medications. Encouraged patient to call MD for any changes in condition.  Reviewed importance of PCP follow up and patient reports she will call MD office today to scheduled. Reviewed and offered 30 day  program and patient declined.   Orpha Blade, RN, BSN, CEN [employer]- Transition of Care Team.  [facility] [phone]

## 2023-05-14 ENCOUNTER — Encounter: Payer: Self-pay | Admitting: Radiology

## 2023-05-17 ENCOUNTER — Other Ambulatory Visit (HOSPITAL_COMMUNITY): Payer: Self-pay

## 2023-05-17 ENCOUNTER — Encounter: Payer: Self-pay | Admitting: Internal Medicine

## 2023-05-17 ENCOUNTER — Ambulatory Visit: Admitting: Internal Medicine

## 2023-05-17 VITALS — BP 128/78 | HR 59 | Temp 98.0°F | Wt 152.8 lb

## 2023-05-17 DIAGNOSIS — I6522 Occlusion and stenosis of left carotid artery: Secondary | ICD-10-CM | POA: Diagnosis not present

## 2023-05-17 DIAGNOSIS — Z09 Encounter for follow-up examination after completed treatment for conditions other than malignant neoplasm: Secondary | ICD-10-CM

## 2023-05-17 NOTE — Progress Notes (Signed)
 Hospital follow-up visit     CC/Reason for Visit: Hospitalization follow-up  HPI: Tasha Garrett is a 73 y.o. female who is coming in today for the above mentioned reasons, specifically transitional care services face-to-face visit.    Dates hospitalized: 05/05/2023-05/10/2023 Days since discharge from hospital: 6 Patient was discharged from the hospital to: Home Reason for admission to hospital: High-grade left internal carotid artery stenosis Date of interactive phone contact with patient and/or caregiver: 05/07/2023  I have reviewed in detail patient's discharge summary plus pertinent specific notes, labs, and images from the hospitalization.  Yes  Patient was admitted to the hospital on 4/23 for planned left TCAR and angioplasty.  She did well and was discharged home on the 28th.  She is on dual antiplatelet therapy with aspirin  and Plavix .  She has follow-up scheduled with vascular surgery in 4 weeks.  Medication reconciliation was done today and patient is taking meds as recommended by discharging hospitalist/specialist.  Yes   Past Medical/Surgical History: Past Medical History:  Diagnosis Date   Arthritis    Blood in stool    Carotid artery occlusion    Chronic kidney disease    stage 3A   Coronary arteriosclerosis    Diabetes mellitus without complication (HCC)    type 2, no meds, does not check blood sugar per pt on 04/27/23   High cholesterol    Hypertension    Hypothyroid    Myocardial infarction (HCC) 2012   in [place]   Urinary tract infection     Past Surgical History:  Procedure Laterality Date   ABDOMINAL HYSTERECTOMY     COLONOSCOPY     CORONARY ARTERY BYPASS GRAFT  05/13/2011   CABG x3: LIMA-mid LAD, SVG-OM, SVG-RCA ([facility])   EYE SURGERY Bilateral    cataracts removed   TRANSCAROTID ARTERY REVASCULARIZATION  Left 05/05/2023   Procedure: LEFT TRANSCAROTID ARTERY REVASCULARIZATION (TCAR);  Surgeon: [name], MD;  Location: [facility]  OR;  Service: Vascular;  Laterality: Left;    Social History:  reports that she quit smoking about 13 years ago. Her smoking use included cigarettes. She started smoking about 33 years ago. She has a 10 pack-year smoking history. She has never used smokeless tobacco. She reports that she does not currently use alcohol. She reports that she does not currently use drugs.  Allergies: No Known Allergies  Family History:  Family History  Problem Relation Age of Onset   ALS Mother      Current Outpatient Medications:    amLODipine  (NORVASC ) 10 MG tablet, TAKE 1 TABLET EVERY DAY, Disp: 90 tablet, Rfl: 1   aspirin  EC 81 MG tablet, Take 81 mg by mouth in the morning., Disp: , Rfl:    atorvastatin  (LIPITOR) 80 MG tablet, Take 1 tablet (80 mg total) by mouth at bedtime., Disp: 90 tablet, Rfl: 3   brimonidine  (ALPHAGAN ) 0.2 % ophthalmic solution, Place 1 drop into both eyes 2 (two) times daily., Disp: , Rfl:    brimonidine  (ALPHAGAN ) 0.2 % ophthalmic solution, Place 1 drop into both eyes 2 (two) times daily 12 hr apart., Disp: 45 mL, Rfl: 4   clopidogrel  (PLAVIX ) 75 MG tablet, Take 1 tablet (75 mg total) by mouth daily., Disp: 30 tablet, Rfl: 6   Cod Liver Oil CAPS, Take 1 capsule by mouth in the morning., Disp: , Rfl:    cyclobenzaprine  (FLEXERIL ) 5 MG tablet, Take 1 tablet (5 mg total) by mouth at bedtime as needed., Disp: 30 tablet, Rfl:  0   diclofenac  Sodium (VOLTAREN ) 1 % GEL, APPLY 2 GRAMS TOPICALLY 4 TIMES DAILY (Patient taking differently: Apply 2 g topically 4 (four) times daily as needed (pain.).), Disp: 600 g, Rfl: 2   isosorbide  mononitrate (IMDUR ) 30 MG 24 hr tablet, Take 1 tablet (30 mg total) by mouth daily., Disp: 90 tablet, Rfl: 1   latanoprost  (XALATAN ) 0.005 % ophthalmic solution, Place 1 drop into both eyes at bedtime., Disp: , Rfl:    latanoprost  (XALATAN ) 0.005 % ophthalmic solution, Place 1 drop into both eyes at bedtime., Disp: 7.5 mL, Rfl: 4   levothyroxine  (SYNTHROID ) 137  MCG tablet, Take 1 tablet (137 mcg total) by mouth daily., Disp: 90 tablet, Rfl: 1   losartan  (COZAAR ) 100 MG tablet, Take 1 tablet (100 mg total) by mouth daily., Disp: 90 tablet, Rfl: 1   metoprolol  tartrate (LOPRESSOR ) 25 MG tablet, TAKE 1 TABLET TWICE DAILY, Disp: 180 tablet, Rfl: 1   Multiple Vitamin (MULTIVITAMIN WITH MINERALS) TABS tablet, Take 1 tablet by mouth in the morning., Disp: , Rfl:    oxyCODONE -acetaminophen  (PERCOCET) 5-325 MG tablet, Take 1 tablet by mouth every 6 (six) hours as needed for severe pain (pain score 7-10)., Disp: 8 tablet, Rfl: 0   SYMBICORT  160-4.5 MCG/ACT inhaler, INHALE 2 PUFFS INTO THE LUNGS IN THE MORNING AND AT BEDTIME. (Patient taking differently: Inhale 2 puffs into the lungs 2 (two) times daily as needed (respiratory issues.).), Disp: 3 each, Rfl: 3  Review of Systems:  Negative except as mentioned in HPI.    Physical Exam: Vitals:   05/17/23 0836 05/17/23 0845  BP: (!) 150/80 128/78  Pulse: (!) 59   Temp: 98 F (36.7 C)   TempSrc: Oral   SpO2: 99%   Weight: 152 lb 12.8 oz (69.3 kg)     Body mass index is 28.87 kg/m.   Physical Exam Vitals reviewed.  Constitutional:      Appearance: Normal appearance.  HENT:     Head: Normocephalic and atraumatic.  Eyes:     Conjunctiva/sclera: Conjunctivae normal.  Neck:     Comments: Incision is healing well Cardiovascular:     Rate and Rhythm: Normal rate and regular rhythm.  Pulmonary:     Effort: Pulmonary effort is normal.     Breath sounds: Normal breath sounds.  Skin:    General: Skin is warm and dry.  Neurological:     General: No focal deficit present.     Mental Status: She is alert and oriented to person, place, and time.  Psychiatric:        Mood and Affect: Mood normal.        Behavior: Behavior normal.        Thought Content: Thought content normal.        Judgment: Judgment normal.     Impression and Plan:  Hospital discharge follow-up  Stenosis of left carotid  artery  [facility] charts reviewed in detail. - She has follow-up scheduled with vascular surgery and is taking dual antiplatelet therapy as prescribed.  Medical decision making of low complexity was utilized today.     Marguerita Shih, MD [facility]

## 2023-05-18 ENCOUNTER — Other Ambulatory Visit: Payer: Self-pay | Admitting: Internal Medicine

## 2023-05-18 ENCOUNTER — Other Ambulatory Visit (HOSPITAL_COMMUNITY): Payer: Self-pay

## 2023-05-18 DIAGNOSIS — M159 Polyosteoarthritis, unspecified: Secondary | ICD-10-CM

## 2023-05-19 ENCOUNTER — Other Ambulatory Visit (HOSPITAL_COMMUNITY): Payer: Self-pay

## 2023-05-19 ENCOUNTER — Encounter: Payer: Self-pay | Admitting: Pharmacist

## 2023-05-19 ENCOUNTER — Other Ambulatory Visit: Payer: Self-pay

## 2023-05-19 MED ORDER — LOSARTAN POTASSIUM 100 MG PO TABS
100.0000 mg | ORAL_TABLET | Freq: Every day | ORAL | 1 refills | Status: AC
Start: 2023-05-19 — End: ?
  Filled 2023-05-19 (×2): qty 90, 90d supply, fill #0

## 2023-05-19 MED ORDER — DICLOFENAC SODIUM 1 % EX GEL
CUTANEOUS | 2 refills | Status: DC
  Filled 2023-05-19: qty 600, 72d supply, fill #0
  Filled 2023-05-19: qty 600, 90d supply, fill #0

## 2023-05-19 MED ORDER — ISOSORBIDE MONONITRATE ER 30 MG PO TB24
30.0000 mg | ORAL_TABLET | Freq: Every day | ORAL | 1 refills | Status: AC
Start: 2023-05-19 — End: ?
  Filled 2023-05-19 (×2): qty 90, 90d supply, fill #0

## 2023-05-19 MED ORDER — ATORVASTATIN CALCIUM 40 MG PO TABS
40.0000 mg | ORAL_TABLET | Freq: Every day | ORAL | 1 refills | Status: DC
  Filled 2023-05-19: qty 90, 90d supply, fill #0

## 2023-05-19 MED FILL — Budesonide-Formoterol Fumarate Dihyd Aerosol 160-4.5 MCG/ACT: RESPIRATORY_TRACT | 30 days supply | Qty: 30.6 | Fill #0 | Status: CN

## 2023-05-19 MED FILL — Budesonide-Formoterol Fumarate Dihyd Aerosol 160-4.5 MCG/ACT: RESPIRATORY_TRACT | 90 days supply | Qty: 30.6 | Fill #0 | Status: CN

## 2023-05-20 ENCOUNTER — Other Ambulatory Visit (HOSPITAL_COMMUNITY): Payer: Self-pay

## 2023-05-20 MED ORDER — BUDESONIDE-FORMOTEROL FUMARATE 160-4.5 MCG/ACT IN AERO
4.0000 | INHALATION_SPRAY | Freq: Every day | RESPIRATORY_TRACT | 3 refills | Status: DC
  Filled 2023-05-20: qty 30.6, 90d supply, fill #0

## 2023-05-20 MED ORDER — ATORVASTATIN CALCIUM 40 MG PO TABS
40.0000 mg | ORAL_TABLET | Freq: Every day | ORAL | 1 refills | Status: DC
  Filled 2023-05-20: qty 90, 90d supply, fill #0

## 2023-05-31 ENCOUNTER — Encounter: Payer: Self-pay | Admitting: Orthopaedic Surgery

## 2023-05-31 ENCOUNTER — Ambulatory Visit: Admitting: Orthopaedic Surgery

## 2023-05-31 DIAGNOSIS — S83272D Complex tear of lateral meniscus, current injury, left knee, subsequent encounter: Secondary | ICD-10-CM | POA: Diagnosis not present

## 2023-05-31 NOTE — Progress Notes (Signed)
 The patient is well-known to us .  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  She has recently had carotid surgery earlier this year and is at the point where she does wish for us  to schedule her for a left knee arthroscopy.  We had recommended this as opposed to a knee replacement given her signs and symptoms combined with MRI showing a complex lateral meniscal tear but no full-thickness cartilage deficit of the lateral aspect of her knee.  She is 73 years old.  She is otherwise in pretty good shape and has done well from her carotid surgery.  She is on Plavix .  Examination of her left knee does show valgus malalignment with a moderate effusion.  There is lateral joint line tenderness only.  There is some slight patellofemoral crepitation throughout the arc of motion of the knee.  I did talk in length in detail again about a knee arthroscopy versus replacement of the left knee.  I do feel it is worthwhile trying an arthroscopic intervention given her signs and symptoms and MRI findings combined with the failure of conservative treatment.  I would not need her stopping Plavix  prior to a simple knee arthroscopy compared to if this was a knee replacement.  We will work on getting her scheduled soon for surgery as an outpatient and then we will see her back in 1 week follow-up.

## 2023-06-01 DIAGNOSIS — I10 Essential (primary) hypertension: Secondary | ICD-10-CM | POA: Diagnosis not present

## 2023-06-01 DIAGNOSIS — R778 Other specified abnormalities of plasma proteins: Secondary | ICD-10-CM | POA: Diagnosis not present

## 2023-06-01 DIAGNOSIS — N1831 Chronic kidney disease, stage 3a: Secondary | ICD-10-CM | POA: Diagnosis not present

## 2023-06-01 DIAGNOSIS — E875 Hyperkalemia: Secondary | ICD-10-CM | POA: Diagnosis not present

## 2023-06-02 ENCOUNTER — Ambulatory Visit (INDEPENDENT_AMBULATORY_CARE_PROVIDER_SITE_OTHER): Admitting: Family Medicine

## 2023-06-02 ENCOUNTER — Encounter: Payer: Self-pay | Admitting: Family Medicine

## 2023-06-02 ENCOUNTER — Ambulatory Visit: Payer: Self-pay

## 2023-06-02 VITALS — BP 140/76 | HR 69 | Temp 98.4°F | Ht 61.0 in | Wt 153.0 lb

## 2023-06-02 DIAGNOSIS — R310 Gross hematuria: Secondary | ICD-10-CM

## 2023-06-02 DIAGNOSIS — R109 Unspecified abdominal pain: Secondary | ICD-10-CM | POA: Diagnosis not present

## 2023-06-02 LAB — POCT URINALYSIS DIPSTICK
Bilirubin, UA: NEGATIVE
Blood, UA: POSITIVE
Glucose, UA: NEGATIVE
Ketones, UA: NEGATIVE
Leukocytes, UA: NEGATIVE
Nitrite, UA: NEGATIVE
Protein, UA: POSITIVE — AB
Spec Grav, UA: 1.02 (ref 1.010–1.025)
Urobilinogen, UA: 0.2 U/dL
pH, UA: 6 (ref 5.0–8.0)

## 2023-06-02 NOTE — Telephone Encounter (Signed)
 Patient has appt 5/21

## 2023-06-02 NOTE — Progress Notes (Signed)
 Established Patient Office Visit   Subjective  Patient ID: Tasha Garrett, female    DOB: 1950/03/05  Age: 73 y.o. MRN: [id number]  Chief Complaint  Patient presents with   Medical Management of Chronic Issues    Possible UTI, started a day ago, pink, left side pelvic pain, sharp,     Patient is a 73 year old female followed by Dr. Ival Marines and seen for acute concern.  Patient endorses left lower flank pain x 1 day with pinkish colored urine noted in toilet.  Endorses episode of nausea earlier today with the pain.  Denies constipation, changes in medication, increased flatus, dysuria, suprapubic pain/pressure, back pain.  Patient drinks mostly water throughout the day.  Had 1 or 2 sodas in the last 2 weeks.  Denies changes in medication.    Patient Active Problem List   Diagnosis Date Noted   Asymptomatic stenosis of left carotid artery 05/05/2023   Bilateral hearing loss 09/21/2022   Abnormal SPEP 09/21/2022   Hearing loss 09/21/2022   Carotid stenosis 08/11/2022   Vitamin D  deficiency 06/01/2022   Microalbuminuria due to type 2 diabetes mellitus (HCC) 05/12/2021   DM (diabetes mellitus), type 2 (HCC) 05/08/2021   Chronic kidney disease, stage 3 (HCC) 04/04/2019   Coronary arteriosclerosis 04/04/2019   Essential hypertension 04/04/2019   Hx of non-ST elevation myocardial infarction (NSTEMI) 05/14/2011   Hyperlipidemia with target LDL less than 70 05/14/2011   Hypothyroid 05/14/2011   Past Medical History:  Diagnosis Date   Arthritis    Blood in stool    Carotid artery occlusion    Chronic kidney disease    stage 3A   Coronary arteriosclerosis    Diabetes mellitus without complication (HCC)    type 2, no meds, does not check blood sugar per pt on 04/27/23   High cholesterol    Hypertension    Hypothyroid    Myocardial infarction (HCC) 2012   in [place]   Urinary tract infection    Past Surgical History:  Procedure Laterality Date   ABDOMINAL HYSTERECTOMY      COLONOSCOPY     CORONARY ARTERY BYPASS GRAFT  05/13/2011   CABG x3: LIMA-mid LAD, SVG-OM, SVG-RCA ([facility])   EYE SURGERY Bilateral    cataracts removed   TRANSCAROTID ARTERY REVASCULARIZATION  Left 05/05/2023   Procedure: LEFT TRANSCAROTID ARTERY REVASCULARIZATION (TCAR);  Surgeon: [name], MD;  Location: [facility] OR;  Service: Vascular;  Laterality: Left;   Social History   Tobacco Use   Smoking status: Former    Current packs/day: 0.00    Average packs/day: 0.5 packs/day for 20.0 years (10.0 ttl pk-yrs)    Types: Cigarettes    Start date: 04/1990    Quit date: 04/2010    Years since quitting: 13.1   Smokeless tobacco: Never   Tobacco comments:    Pt states she stopped smoking 10 years ago and at most smoked 1/2 ppd. ALS 10/12  Vaping Use   Vaping status: Never Used  Substance Use Topics   Alcohol use: Not Currently   Drug use: Not Currently   Family History  Problem Relation Age of Onset   ALS Mother    No Known Allergies  ROS Negative unless stated above    Objective:      BP (!) 140/76 (BP Location: Left Arm, Patient Position: Sitting, Cuff Size: Normal)   Pulse 69   Temp 98.4 F (36.9 C) (Oral)   Ht 5\' 1"  (1.549 m)   Wt 153 lb (  69.4 kg)   SpO2 97%   BMI 28.91 kg/m  BP Readings from Last 3 Encounters:  06/02/23 (!) 140/76  05/17/23 128/78  05/06/23 (!) 127/57   Wt Readings from Last 3 Encounters:  06/02/23 153 lb (69.4 kg)  05/17/23 152 lb 12.8 oz (69.3 kg)  05/05/23 153 lb (69.4 kg)      Physical Exam     12/21/2022    3:07 PM 09/21/2022    3:32 PM 07/29/2022    8:47 AM  Depression screen PHQ 2/9  Decreased Interest 0 0   Down, Depressed, Hopeless 0 0   PHQ - 2 Score 0 0   Altered sleeping  0 0  Tired, decreased energy  0   Change in appetite  0 0  Feeling bad or failure about yourself   0 0  Trouble concentrating  0   Moving slowly or fidgety/restless  0 0  Suicidal thoughts  0 0  PHQ-9 Score  0       09/21/2022    3:32 PM  07/29/2022    8:48 AM 05/20/2022   10:53 AM  GAD 7 : Generalized Anxiety Score  Nervous, Anxious, on Edge 0 0 0  Control/stop worrying 0 0 0  Worry too much - different things 0 1 1  Trouble relaxing 0 1 0  Restless 0 0 0  Easily annoyed or irritable 0 1 1  Afraid - awful might happen 0 1 0  Total GAD 7 Score 0 4 2     Results for orders placed or performed in visit on 06/02/23  POC Urinalysis Dipstick  Result Value Ref Range   Color, UA yellow    Clarity, UA Clear    Glucose, UA Negative Negative   Bilirubin, UA neg    Ketones, UA neg    Spec Grav, UA 1.020 1.010 - 1.025   Blood, UA pos    pH, UA 6.0 5.0 - 8.0   Protein, UA Positive (A) Negative   Urobilinogen, UA 0.2 0.2 or 1.0 E.U./dL   Nitrite, UA neg    Leukocytes, UA Negative Negative   Appearance     Odor        Assessment & Plan:   Left flank pain -     POCT urinalysis dipstick -     Urinalysis, Routine w reflex microscopic -     CT RENAL STONE STUDY; Future  Gross hematuria -     Urinalysis, Routine w reflex microscopic -     Urine Culture -     CT RENAL STONE STUDY; Future  Patient with hematuria x 1 day and left flank pain.  Concern for renal calculi.  POC UA with RBCs and protein.  Obtain UCX and micro.  Patient to continue increasing fluid intake.  Given strainer and hat.  CT stone study ordered.  Consider Flomax.  Given strict precautions.  Per chart review renal ultrasound 09/18/2022 with mild dilation right renal pelvis without calyectasis.  Subcentimeter cyst, upper pole R kidney.  1 small nonobstructing intrarenal stone noted on right.  Left kidney not visualized.  Severely atrophic based on the prior CT.  Given strict precautions.  Return if symptoms worsen or fail to improve.   Viola Greulich, MD

## 2023-06-02 NOTE — Telephone Encounter (Signed)
  Chief Complaint: hematuria Symptoms: pink tinged urine, L pelvic pain, urgency  Frequency: yesterday  Pertinent Negatives: Patient denies dysuria  Disposition: [] ED /[] Urgent Care (no appt availability in office) / [x] Appointment(In office/virtual)/ []  [facility]/ [] Home Care/ [] Refused Recommended Disposition /[] [facility]/ []  Follow-up with PCP Additional Notes: scheduled appt today at 330 with Dr. Arliss Lam.   Copied from CRM [id number]. Topic: Clinical - Red Word Triage >> Jun 02, 2023  8:45 AM Jenna Moan wrote: Red Word that prompted transfer to Nurse Triage: patient urine has been a little pink and left side has hurting before she has to urinate  Reason for Disposition  Side (flank) or lower back pain present  Answer Assessment - Initial Assessment Questions 1. SYMPTOM: "What's the main symptom you're concerned about?" (e.g., frequency, incontinence)     Urine pink in color 2. ONSET: "When did the  sx  start?"     Yesterday  3. PAIN: "Is there any pain?" If Yes, ask: "How bad is it?" (Scale: 1-10; mild, moderate, severe)     At times  5. OTHER SYMPTOMS: "Do you have any other symptoms?" (e.g., blood in urine, fever, flank pain, pain with urination)     L lower pelvic pain  Protocols used: Urinary Symptoms-A-AH

## 2023-06-03 ENCOUNTER — Other Ambulatory Visit (HOSPITAL_COMMUNITY)

## 2023-06-03 LAB — URINALYSIS, ROUTINE W REFLEX MICROSCOPIC
Bilirubin Urine: NEGATIVE
Ketones, ur: NEGATIVE
Leukocytes,Ua: NEGATIVE
Nitrite: NEGATIVE
Specific Gravity, Urine: 1.015 (ref 1.000–1.030)
Total Protein, Urine: NEGATIVE
Urine Glucose: NEGATIVE
Urobilinogen, UA: 0.2 (ref 0.0–1.0)
pH: 6 (ref 5.0–8.0)

## 2023-06-03 LAB — URINE CULTURE
MICRO NUMBER:: 16487374
Result:: NO GROWTH
SPECIMEN QUALITY:: ADEQUATE

## 2023-06-04 ENCOUNTER — Other Ambulatory Visit: Payer: Self-pay | Admitting: *Deleted

## 2023-06-04 ENCOUNTER — Ambulatory Visit (HOSPITAL_BASED_OUTPATIENT_CLINIC_OR_DEPARTMENT_OTHER)
Admission: RE | Admit: 2023-06-04 | Discharge: 2023-06-04 | Disposition: A | Discharge status: Home / Self Care | Source: Ambulatory Visit | Attending: Family Medicine | Admitting: Family Medicine

## 2023-06-04 ENCOUNTER — Ambulatory Visit (HOSPITAL_COMMUNITY)

## 2023-06-04 ENCOUNTER — Ambulatory Visit: Payer: Self-pay | Admitting: Family Medicine

## 2023-06-04 DIAGNOSIS — I6522 Occlusion and stenosis of left carotid artery: Secondary | ICD-10-CM

## 2023-06-04 DIAGNOSIS — R109 Unspecified abdominal pain: Secondary | ICD-10-CM | POA: Insufficient documentation

## 2023-06-04 DIAGNOSIS — R310 Gross hematuria: Secondary | ICD-10-CM | POA: Insufficient documentation

## 2023-06-04 DIAGNOSIS — K828 Other specified diseases of gallbladder: Secondary | ICD-10-CM | POA: Diagnosis not present

## 2023-06-04 DIAGNOSIS — K8020 Calculus of gallbladder without cholecystitis without obstruction: Secondary | ICD-10-CM | POA: Diagnosis not present

## 2023-06-04 DIAGNOSIS — N200 Calculus of kidney: Secondary | ICD-10-CM | POA: Diagnosis not present

## 2023-06-10 ENCOUNTER — Other Ambulatory Visit (HOSPITAL_COMMUNITY): Payer: Self-pay

## 2023-06-14 DIAGNOSIS — H1131 Conjunctival hemorrhage, right eye: Secondary | ICD-10-CM | POA: Diagnosis not present

## 2023-06-16 ENCOUNTER — Telehealth: Payer: Self-pay

## 2023-06-16 NOTE — Telephone Encounter (Signed)
 I called patient regarding scheduling surgery and left voice mail for return call.

## 2023-06-21 ENCOUNTER — Ambulatory Visit (INDEPENDENT_AMBULATORY_CARE_PROVIDER_SITE_OTHER): Payer: Medicare HMO

## 2023-06-21 VITALS — Ht 61.0 in | Wt 153.0 lb

## 2023-06-21 DIAGNOSIS — Z0000 Encounter for general adult medical examination without abnormal findings: Secondary | ICD-10-CM

## 2023-06-21 NOTE — Patient Instructions (Addendum)
 Ms. Tasha Garrett , Thank you for taking time out of your busy schedule to complete your Annual Wellness Visit with me. I enjoyed our conversation and look forward to speaking with you again next year. I, as well as your care team,  appreciate your ongoing commitment to your health goals. Please review the following plan we discussed and let me know if I can assist you in the future. Your Game plan/ To Do List    Referrals: If you haven't heard from the office you've been referred to, please reach out to them at the phone provided.   Follow up Visits: Next Medicare AWV with our clinical staff: 06/26/24 @ 10:00a   Have you seen your provider in the last 6 months (3 months if uncontrolled diabetes)?  Next Office Visit with your provider: 07/22/23 @ 1p  Clinician Recommendations:  Aim for 30 minutes of exercise or brisk walking, 6-8 glasses of water, and 5 servings of fruits and vegetables each day.       This is a list of the screening recommended for you and due dates:  Health Maintenance  Topic Date Due   Colon Cancer Screening  Never done   DEXA scan (bone density measurement)  Never done   COVID-19 Vaccine (4 - 2024-25 season) 09/13/2022   Yearly kidney health urinalysis for diabetes  05/20/2023   Complete foot exam   05/20/2023   Eye exam for diabetics  05/28/2023   Flu Shot  08/13/2023   Hemoglobin A1C  09/22/2023   Yearly kidney function blood test for diabetes  05/05/2024   Medicare Annual Wellness Visit  06/20/2024   Mammogram  11/25/2024   DTaP/Tdap/Td vaccine (3 - Td or Tdap) 10/23/2027   Pneumonia Vaccine  Completed   Hepatitis C Screening  Completed   Zoster (Shingles) Vaccine  Completed   HPV Vaccine  Aged Out   Meningitis B Vaccine  Aged Out    Advanced directives: (Declined) Advance directive discussed with you today. Even though you declined this today, please call our office should you change your mind, and we can give you the proper paperwork for you to fill out. Advance  Care Planning is important because it:  [x]  Makes sure you receive the medical care that is consistent with your values, goals, and preferences  [x]  It provides guidance to your family and loved ones and reduces their decisional burden about whether or not they are making the right decisions based on your wishes.  Follow the link provided in your after visit summary or read over the paperwork we have mailed to you to help you started getting your Advance Directives in place. If you need assistance in completing these, please reach out to us  so that we can help you!  See attachments for Preventive Care and Fall Prevention Tips.

## 2023-06-21 NOTE — Progress Notes (Signed)
 Subjective:   Tasha Garrett is a 73 y.o. who presents for a Medicare Wellness preventive visit.  As a reminder, Annual Wellness Visits don't include a physical exam, and some assessments may be limited, especially if this visit is performed virtually. We may recommend an in-person follow-up visit with your provider if needed.  Visit Complete: Virtual I connected with  [name] on 06/21/23 by a audio enabled telemedicine application and verified that I am speaking with the correct person using two identifiers.  Patient Location: Home  Provider Location: Home Office  I discussed the limitations of evaluation and management by telemedicine. The patient expressed understanding and agreed to proceed.  Vital Signs: Because this visit was a virtual/telehealth visit, some criteria may be missing or patient reported. Any vitals not documented were not able to be obtained and vitals that have been documented are patient reported.    Persons Participating in Visit: Patient.  AWV Questionnaire: No: Patient Medicare AWV questionnaire was not completed prior to this visit.  Cardiac Risk Factors include: advanced age (>5men, >4 women);diabetes mellitus;hypertension     Objective:     Today's Vitals   06/21/23 0954  Weight: 153 lb (69.4 kg)  Height: 5\' 1"  (1.549 m)   Body mass index is 28.91 kg/m.     06/21/2023   10:09 AM 05/05/2023    5:59 AM 04/27/2023    2:33 PM 05/19/2022    4:05 PM  Advanced Directives  Does Patient Have a Medical Advance Directive? No Yes Yes No  Type of Advance Directive  Healthcare Power of [employer] Power of Attorney   Does patient want to make changes to medical advance directive?   No - Patient declined   Copy of Healthcare Power of Attorney in Chart?  No - copy requested No - copy requested   Would patient like information on creating a medical advance directive? No - Patient declined   No - Patient declined    Current Medications  (verified) Outpatient Encounter Medications as of 06/21/2023  Medication Sig   amLODipine  (NORVASC ) 10 MG tablet TAKE 1 TABLET EVERY DAY   aspirin  EC 81 MG tablet Take 81 mg by mouth in the morning.   atorvastatin  (LIPITOR) 40 MG tablet Take 1 tablet (40 mg total) by mouth at bedtime.   atorvastatin  (LIPITOR) 40 MG tablet Take 1 tablet (40 mg total) by mouth at bedtime.   atorvastatin  (LIPITOR) 80 MG tablet Take 1 tablet (80 mg total) by mouth at bedtime.   brimonidine  (ALPHAGAN ) 0.2 % ophthalmic solution Place 1 drop into both eyes 2 (two) times daily.   brimonidine  (ALPHAGAN ) 0.2 % ophthalmic solution Place 1 drop into both eyes 2 (two) times daily 12 hr apart.   budesonide -formoterol  (SYMBICORT ) 160-4.5 MCG/ACT inhaler INHALE 2 PUFFS INTO THE LUNGS IN THE MORNING AND AT BEDTIME.   budesonide -formoterol  (SYMBICORT ) 160-4.5 MCG/ACT inhaler Inhale 2 puffs into the lungs in the morning and 2 puffs at bedtime   clopidogrel  (PLAVIX ) 75 MG tablet Take 1 tablet (75 mg total) by mouth daily.   Cod Liver Oil CAPS Take 1 capsule by mouth in the morning.   cyclobenzaprine  (FLEXERIL ) 5 MG tablet Take 1 tablet (5 mg total) by mouth at bedtime as needed.   diclofenac  Sodium (VOLTAREN ) 1 % GEL APPLY 2 GRAMS TOPICALLY 4 TIMES DAILY   isosorbide  mononitrate (IMDUR ) 30 MG 24 hr tablet Take 1 tablet (30 mg total) by mouth daily.   latanoprost  (XALATAN ) 0.005 % ophthalmic solution Place  1 drop into both eyes at bedtime.   latanoprost  (XALATAN ) 0.005 % ophthalmic solution Place 1 drop into both eyes at bedtime.   levothyroxine  (SYNTHROID ) 137 MCG tablet Take 1 tablet (137 mcg total) by mouth daily.   losartan  (COZAAR ) 100 MG tablet Take 1 tablet (100 mg total) by mouth daily.   metoprolol  tartrate (LOPRESSOR ) 25 MG tablet TAKE 1 TABLET TWICE DAILY   Multiple Vitamin (MULTIVITAMIN WITH MINERALS) TABS tablet Take 1 tablet by mouth in the morning.   oxyCODONE -acetaminophen  (PERCOCET) 5-325 MG tablet Take 1 tablet by  mouth every 6 (six) hours as needed for severe pain (pain score 7-10). (Patient not taking: Reported on 06/02/2023)   No facility-administered encounter medications on file as of 06/21/2023.    Allergies (verified) Patient has no known allergies.   History: Past Medical History:  Diagnosis Date   Arthritis    Blood in stool    Carotid artery occlusion    Chronic kidney disease    stage 3A   Coronary arteriosclerosis    Diabetes mellitus without complication (HCC)    type 2, no meds, does not check blood sugar per pt on 04/27/23   High cholesterol    Hypertension    Hypothyroid    Myocardial infarction [facility]) 2012   in [place]   Urinary tract infection    Past Surgical History:  Procedure Laterality Date   ABDOMINAL HYSTERECTOMY     COLONOSCOPY     CORONARY ARTERY BYPASS GRAFT  05/13/2011   CABG x3: LIMA-mid LAD, SVG-OM, SVG-RCA ([facility])   EYE SURGERY Bilateral    cataracts removed   TRANSCAROTID ARTERY REVASCULARIZATION  Left 05/05/2023   Procedure: LEFT TRANSCAROTID ARTERY REVASCULARIZATION (TCAR);  Surgeon: [name], MD;  Location[facility] OR;  Service: Vascular;  Laterality: Left;   Family History  Problem Relation Age of Onset   ALS Mother    Social History   Socioeconomic History   Marital status: Widowed    Spouse name: Not on file   Number of children: Not on file   Years of education: Not on file   Highest education level: 12th grade  Occupational History   Not on file  Tobacco Use   Smoking status: Former    Current packs/day: 0.00    Average packs/day: 0.5 packs/day for 20.0 years (10.0 ttl pk-yrs)    Types: Cigarettes    Start date: 04/1990    Quit date: 04/2010    Years since quitting: 13.1   Smokeless tobacco: Never   Tobacco comments:    Pt states she stopped smoking 10 years ago and at most smoked 1/2 ppd. ALS 10/12  Vaping Use   Vaping status: Never Used  Substance and Sexual Activity   Alcohol use: Not Currently   Drug  use: Not Currently   Sexual activity: Not Currently    Birth control/protection: Post-menopausal  Other Topics Concern   Not on file  Social History Narrative   Not on file   Social Drivers of Health   Financial Resource Strain: Low Risk  (06/21/2023)   Overall Financial Resource Strain (CARDIA)    Difficulty of Paying Living Expenses: Not hard at all  Food Insecurity: No Food Insecurity (06/21/2023)   Hunger Vital Sign    Worried About Running Out of Food in the Last Year: Never true    Ran Out of Food in the Last Year: Never true  Recent Concern: Food Insecurity - Food Insecurity Present (05/05/2023)   Hunger Vital  Sign    Worried About Programme researcher, broadcasting/film/video in the Last Year: Sometimes true    Ran Out of Food in the Last Year: Never true  Transportation Needs: No Transportation Needs (06/21/2023)   PRAPARE - Administrator, Civil Service (Medical): No    Lack of Transportation (Non-Medical): No  Physical Activity: Insufficiently Active (06/21/2023)   Exercise Vital Sign    Days of Exercise per Week: 2 days    Minutes of Exercise per Session: 20 min  Stress: No Stress Concern Present (06/21/2023)   [employer] of Occupational Health - Occupational Stress Questionnaire    Feeling of Stress : Not at all  Social Connections: Moderately Integrated (06/21/2023)   Social Connection and Isolation Panel [NHANES]    Frequency of Communication with Friends and Family: More than three times a week    Frequency of Social Gatherings with Friends and Family: More than three times a week    Attends Religious Services: More than 4 times per year    Active Member of [employer] or Organizations: Yes    Attends Banker Meetings: More than 4 times per year    Marital Status: Widowed    Tobacco Counseling Counseling given: Not Answered Tobacco comments: Pt states she stopped smoking 10 years ago and at most smoked 1/2 ppd. ALS 10/12    Clinical Intake:  Pre-visit preparation  completed: Yes  Pain : No/denies pain     BMI - recorded: 28.91 Nutritional Status: BMI 25 -29 Overweight Nutritional Risks: None Diabetes: Yes CBG done?: No Did pt. bring in CBG monitor from home?: No  Lab Results  Component Value Date   HGBA1C 6.6 (A) 03/22/2023   HGBA1C 6.4 (A) 12/21/2022   HGBA1C 6.5 (A) 09/21/2022     How often do you need to have someone help you when you read instructions, pamphlets, or other written materials from your doctor or pharmacy?: 1 - Never  Interpreter Needed?: No  Information entered by :: Farris Hong LPN   Activities of Daily Living     06/21/2023   10:08 AM 04/27/2023    2:40 PM  In your present state of health, do you have any difficulty performing the following activities:  Hearing? 0   Vision? 0   Difficulty concentrating or making decisions? 0   Walking or climbing stairs? 0   Dressing or bathing? 0   Doing errands, shopping? 0 0  Preparing Food and eating ? N   Using the Toilet? N   In the past six months, have you accidently leaked urine? N   Do you have problems with loss of bowel control? N   Managing your Medications? N   Managing your Finances? N   Housekeeping or managing your Housekeeping? N     Patient Care Team: Zilphia Hilt, Charyl Coppersmith, MD as PCP - General (Internal Medicine)  I have updated your Care Teams any recent Medical Services you may have received from other providers in the past year.     Assessment:    This is a routine wellness examination for [place].  Hearing/Vision screen Hearing Screening - Comments:: Denies hearing difficulties   Vision Screening - Comments:: Wears rx glasses - up to date with routine eye exams with  Dr Adell Age   Goals Addressed               This Visit's Progress     Increase physical activity (pt-stated)  Lose weight.       Depression Screen     06/21/2023   10:02 AM 12/21/2022    3:07 PM 09/21/2022    3:32 PM 05/20/2022   10:53 AM 11/11/2021     3:55 PM 09/01/2021    3:03 PM 08/04/2021    3:37 PM  PHQ 2/9 Scores  PHQ - 2 Score 0 0 0 0 0 0 0  PHQ- 9 Score   0 2 0 0 0    Fall Risk     06/21/2023   10:08 AM 12/21/2022    3:07 PM 11/17/2022    8:47 AM 09/21/2022    3:31 PM 07/29/2022    8:47 AM  Fall Risk   Falls in the past year? 0 0 0 0 0  Number falls in past yr: 0 0  0 0  Injury with Fall? 0 0  0 0  Risk for fall due to : No Fall Risks      Follow up Falls evaluation completed Falls evaluation completed  Falls evaluation completed Falls evaluation completed    MEDICARE RISK AT HOME:  Medicare Risk at Home Any stairs in or around the home?: Yes If so, are there any without handrails?: No Home free of loose throw rugs in walkways, pet beds, electrical cords, etc?: Yes Adequate lighting in your home to reduce risk of falls?: Yes Life alert?: No Use of a cane, walker or w/c?: No Grab bars in the bathroom?: No Shower chair or bench in shower?: No Elevated toilet seat or a handicapped toilet?: No  TIMED UP AND GO:  Was the test performed?  No  Cognitive Function: 6CIT completed    09/21/2022    3:17 PM  MMSE - Mini Mental State Exam  Orientation to time 5  Orientation to Place 2  Registration 3  Attention/ Calculation 4  Recall 2  Language- name 2 objects 2  Language- repeat 1  Language- follow 3 step command 3  Language- read & follow direction 1  Write a sentence 1  Copy design 0  Total score 24        06/21/2023   10:09 AM 05/19/2022    4:05 PM  6CIT Screen  What Year? 0 points 0 points  What month? 0 points 0 points  What time? 0 points 0 points  Count back from 20 0 points 0 points  Months in reverse 0 points 0 points  Repeat phrase 0 points 0 points  Total Score 0 points 0 points    Immunizations Immunization History  Administered Date(s) Administered   Fluad Quad(high Dose 65+) 11/11/2021   Fluad Trivalent(High Dose 65+) 09/21/2022   Influenza, High Dose Seasonal PF 11/04/2015, 11/17/2016,  11/18/2017, 11/24/2018, 10/03/2019, 09/30/2020   Influenza-Unspecified 10/26/2020   Moderna Sars-Covid-2 Vaccination 03/13/2019, 04/08/2019, 03/01/2020   Pneumococcal Conjugate-13 12/14/2017   Pneumococcal Polysaccharide-23 05/25/2016   RSV,unspecified 10/02/2022   Td (Adult),5 Lf Tetanus Toxid, Preservative Free 10/22/2017   Tdap 10/22/2017   Zoster Recombinant(Shingrix) 10/31/2018, 12/30/2018    Screening Tests Health Maintenance  Topic Date Due   Colonoscopy  Never done   DEXA SCAN  Never done   COVID-19 Vaccine (4 - 2024-25 season) 09/13/2022   Diabetic kidney evaluation - Urine ACR  05/20/2023   FOOT EXAM  05/20/2023   OPHTHALMOLOGY EXAM  05/28/2023   INFLUENZA VACCINE  08/13/2023   HEMOGLOBIN A1C  09/22/2023   Diabetic kidney evaluation - eGFR measurement  05/05/2024   Medicare Annual Wellness (AWV)  06/20/2024   MAMMOGRAM  11/25/2024   DTaP/Tdap/Td (3 - Td or Tdap) 10/23/2027   Pneumonia Vaccine 33+ Years old  Completed   Hepatitis C Screening  Completed   Zoster Vaccines- Shingrix  Completed   HPV VACCINES  Aged Out   Meningococcal B Vaccine  Aged Out    Health Maintenance  Health Maintenance Due  Topic Date Due   Colonoscopy  Never done   DEXA SCAN  Never done   COVID-19 Vaccine (4 - 2024-25 season) 09/13/2022   Diabetic kidney evaluation - Urine ACR  05/20/2023   FOOT EXAM  05/20/2023   OPHTHALMOLOGY EXAM  05/28/2023   Health Maintenance Items Addressed:   Additional Screening:  Vision Screening: Recommended annual ophthalmology exams for early detection of glaucoma and other disorders of the eye. Would you like a referral to an eye doctor? No    Dental Screening: Recommended annual dental exams for proper oral hygiene  Community Resource Referral / Chronic Care Management: CRR required this visit?  No   CCM required this visit?  No   Plan:    I have personally reviewed and noted the following in the patient's chart:   Medical and social  history Use of alcohol, tobacco or illicit drugs  Current medications and supplements including opioid prescriptions. Patient is not currently taking opioid prescriptions. Functional ability and status Nutritional status Physical activity Advanced directives List of other physicians Hospitalizations, surgeries, and ER visits in previous 12 months Vitals Screenings to include cognitive, depression, and falls Referrals and appointments  In addition, I have reviewed and discussed with patient certain preventive protocols, quality metrics, and best practice recommendations. A written personalized care plan for preventive services as well as general preventive health recommendations were provided to patient.   Dewayne Ford, LPN   [birth year]12   After Visit Summary: (MyChart) Due to this being a telephonic visit, the after visit summary with patients personalized plan was offered to patient via MyChart   Notes: Nothing significant to report at this time.

## 2023-06-22 ENCOUNTER — Ambulatory Visit: Attending: Vascular Surgery | Admitting: Physician Assistant

## 2023-06-22 ENCOUNTER — Encounter: Payer: Self-pay | Admitting: Orthopaedic Surgery

## 2023-06-22 ENCOUNTER — Ambulatory Visit (HOSPITAL_COMMUNITY)
Admission: RE | Admit: 2023-06-22 | Discharge: 2023-06-22 | Disposition: A | Discharge status: Home / Self Care | Source: Ambulatory Visit | Attending: Vascular Surgery | Admitting: Vascular Surgery

## 2023-06-22 VITALS — BP 178/71 | HR 69 | Temp 97.9°F | Resp 18 | Ht 61.0 in | Wt 152.3 lb

## 2023-06-22 DIAGNOSIS — I6522 Occlusion and stenosis of left carotid artery: Secondary | ICD-10-CM

## 2023-06-22 DIAGNOSIS — I6523 Occlusion and stenosis of bilateral carotid arteries: Secondary | ICD-10-CM

## 2023-06-22 NOTE — Progress Notes (Signed)
 POST OPERATIVE OFFICE NOTE    CC:  F/u for surgery  HPI:  Tasha Garrett is a 73 y.o. female who is here for postop visit.  She recently underwent left sided TCAR on 05/05/2023 by Dr. Fulton Job.  This was done for high-grade asymptomatic left internal carotid artery stenosis.  She returns today for follow-up.  She is doing well without any complaints.  She denies any issues with her left-sided neck incision such as drainage, redness, or tenderness.  She denies any strokelike symptoms such as slurred speech, facial droop, sudden weakness/numbness, or sudden visual changes.  She is tolerating her daily aspirin , statin, and Plavix .   No Known Allergies  Current Outpatient Medications  Medication Sig Dispense Refill   amLODipine  (NORVASC ) 10 MG tablet TAKE 1 TABLET EVERY DAY 90 tablet 1   aspirin  EC 81 MG tablet Take 81 mg by mouth in the morning.     atorvastatin  (LIPITOR) 40 MG tablet Take 1 tablet (40 mg total) by mouth at bedtime. 90 tablet 1   atorvastatin  (LIPITOR) 80 MG tablet Take 1 tablet (80 mg total) by mouth at bedtime. 90 tablet 3   brimonidine  (ALPHAGAN ) 0.2 % ophthalmic solution Place 1 drop into both eyes 2 (two) times daily.     brimonidine  (ALPHAGAN ) 0.2 % ophthalmic solution Place 1 drop into both eyes 2 (two) times daily 12 hr apart. 45 mL 4   budesonide -formoterol  (SYMBICORT ) 160-4.5 MCG/ACT inhaler INHALE 2 PUFFS INTO THE LUNGS IN THE MORNING AND AT BEDTIME. (Patient taking differently: Inhale 2 puffs into the lungs as needed.) 30.6 g 3   budesonide -formoterol  (SYMBICORT ) 160-4.5 MCG/ACT inhaler Inhale 2 puffs into the lungs in the morning and 2 puffs at bedtime 30.6 g 3   clopidogrel  (PLAVIX ) 75 MG tablet Take 1 tablet (75 mg total) by mouth daily. 30 tablet 6   Cod Liver Oil CAPS Take 1 capsule by mouth in the morning.     diclofenac  Sodium (VOLTAREN ) 1 % GEL APPLY 2 GRAMS TOPICALLY 4 TIMES DAILY 600 g 2   isosorbide  mononitrate (IMDUR ) 30 MG 24 hr tablet Take 1 tablet (30  mg total) by mouth daily. 90 tablet 1   latanoprost  (XALATAN ) 0.005 % ophthalmic solution Place 1 drop into both eyes at bedtime.     latanoprost  (XALATAN ) 0.005 % ophthalmic solution Place 1 drop into both eyes at bedtime. 7.5 mL 4   levothyroxine  (SYNTHROID ) 137 MCG tablet Take 1 tablet (137 mcg total) by mouth daily. 90 tablet 1   losartan  (COZAAR ) 100 MG tablet Take 1 tablet (100 mg total) by mouth daily. 90 tablet 1   metoprolol  tartrate (LOPRESSOR ) 25 MG tablet TAKE 1 TABLET TWICE DAILY 180 tablet 1   Multiple Vitamin (MULTIVITAMIN WITH MINERALS) TABS tablet Take 1 tablet by mouth in the morning.     cyclobenzaprine  (FLEXERIL ) 5 MG tablet Take 1 tablet (5 mg total) by mouth at bedtime as needed. (Patient not taking: Reported on 06/22/2023) 30 tablet 0   oxyCODONE -acetaminophen  (PERCOCET) 5-325 MG tablet Take 1 tablet by mouth every 6 (six) hours as needed for severe pain (pain score 7-10). (Patient not taking: Reported on 06/22/2023) 8 tablet 0   No current facility-administered medications for this visit.     ROS:  See HPI  Physical Exam:  Incision: Left-sided neck incision well-healed without signs of infection or hematoma Extremities: Palpable radial pulses bilaterally Neuro: Intact motor and sensation of bilateral upper and lower extremities.  No slurred speech or facial droop  Studies: Carotid duplex (06/22/2023) Right Carotid Findings:  +----------+--------+--------+--------+------------------+--------+           PSV cm/sEDV cm/sStenosisPlaque DescriptionComments  +----------+--------+--------+--------+------------------+--------+  CCA Prox  93      7                                           +----------+--------+--------+--------+------------------+--------+  CCA Distal54      8                                           +----------+--------+--------+--------+------------------+--------+  ICA Prox  114     15      1-39%   heterogenous                 +----------+--------+--------+--------+------------------+--------+  ICA Distal42      12                                          +----------+--------+--------+--------+------------------+--------+  ECA      166     11      >50%    heterogenous                +----------+--------+--------+--------+------------------+--------+   +----------+--------+-------+----------------+-------------------+           PSV cm/sEDV cmsDescribe        Arm Pressure (mmHG)  +----------+--------+-------+----------------+-------------------+  Subclavian161    0      Multiphasic, WNL                     +----------+--------+-------+----------------+-------------------+   +---------+--------+--+--------+--+---------+  VertebralPSV cm/s61EDV cm/s12Antegrade  +---------+--------+--+--------+--+---------+      Left Carotid Findings:  +--------+--------+--------+--------+------------------+--------+         PSV cm/sEDV cm/sStenosisPlaque DescriptionComments  +--------+--------+--------+--------+------------------+--------+  CCA Prox72      6                                           +--------+--------+--------+--------+------------------+--------+  ECA    161     0               heterogenous                +--------+--------+--------+--------+------------------+--------+   +----------+--------+--------+----------------+-------------------+           PSV cm/sEDV cm/sDescribe        Arm Pressure (mmHG)  +----------+--------+--------+----------------+-------------------+  [id number]    0       Multiphasic, WNL                     +----------+--------+--------+----------------+-------------------+   +---------+--------+--+--------+--+---------+  VertebralPSV cm/s45EDV cm/s11Antegrade  +---------+--------+--+--------+--+---------+     Left Stent(s):  +------------------------+--------+--------+--------+--------+--------+  Distal  CCA to Distal ICAPSV cm/sEDV cm/sStenosisWaveformComments  +------------------------+--------+--------+--------+--------+--------+  Prox to Stent           51      9                                 +------------------------+--------+--------+--------+--------+--------+  Proximal Stent  69      12                                +------------------------+--------+--------+--------+--------+--------+  Mid Stent               61      14                                +------------------------+--------+--------+--------+--------+--------+  Distal Stent            65      14                                +------------------------+--------+--------+--------+--------+--------+  Distal to Stent         45      11                                +------------------------+--------+--------+--------+--------+--------+     Assessment/Plan:  This is a 73 y.o. female who is here for postop visit  - The patient recently underwent left sided TCAR for asymptomatic critical left ICA stenosis.  Her left-sided neck incision is well-healed without signs of infection -She denies any strokelike symptoms such as slurred speech, facial droop, sudden weakness/numbness, or sudden visual changes -She has no neurological deficits on exam.  She has palpable and equal radial pulses -Carotid duplex demonstrates unchanged 1 to 39% right carotid stenosis.  Her left ICA stent is patent without stenosis -She is tolerating her daily aspirin , Plavix , and statin.  At this time we will continue dual antiplatelet therapy since she is not having any issues with it.  I have explained to the patient that she can discontinue her Plavix  in the future if she has any issues with easy bleeding or bruising on it. - She can follow-up with our office in 9 months with repeat carotid duplex   Deneise Finlay, PA-C [facility] [phone]   Clinic MD:  Marcelino Sera

## 2023-06-30 ENCOUNTER — Other Ambulatory Visit: Payer: Self-pay | Admitting: Internal Medicine

## 2023-07-08 ENCOUNTER — Encounter: Admitting: Physician Assistant

## 2023-07-15 ENCOUNTER — Other Ambulatory Visit (HOSPITAL_COMMUNITY): Payer: Self-pay

## 2023-07-16 MED FILL — Metoprolol Tartrate Tab 25 MG: ORAL | 90 days supply | Qty: 180 | Fill #0 | Status: AC

## 2023-07-17 ENCOUNTER — Other Ambulatory Visit (HOSPITAL_COMMUNITY): Payer: Self-pay

## 2023-07-19 ENCOUNTER — Other Ambulatory Visit (HOSPITAL_COMMUNITY): Payer: Self-pay

## 2023-07-19 ENCOUNTER — Other Ambulatory Visit: Payer: Self-pay

## 2023-07-20 ENCOUNTER — Other Ambulatory Visit (HOSPITAL_COMMUNITY): Payer: Self-pay

## 2023-07-22 ENCOUNTER — Encounter: Payer: Self-pay | Admitting: Internal Medicine

## 2023-07-22 ENCOUNTER — Ambulatory Visit: Admitting: Internal Medicine

## 2023-07-22 VITALS — BP 128/70 | HR 64 | Temp 98.4°F | Ht 61.0 in | Wt 155.9 lb

## 2023-07-22 DIAGNOSIS — I10 Essential (primary) hypertension: Secondary | ICD-10-CM | POA: Diagnosis not present

## 2023-07-22 DIAGNOSIS — E785 Hyperlipidemia, unspecified: Secondary | ICD-10-CM

## 2023-07-22 DIAGNOSIS — E1129 Type 2 diabetes mellitus with other diabetic kidney complication: Secondary | ICD-10-CM

## 2023-07-22 DIAGNOSIS — N183 Chronic kidney disease, stage 3 unspecified: Secondary | ICD-10-CM

## 2023-07-22 DIAGNOSIS — E1159 Type 2 diabetes mellitus with other circulatory complications: Secondary | ICD-10-CM | POA: Diagnosis not present

## 2023-07-22 DIAGNOSIS — M85671 Other cyst of bone, right ankle and foot: Secondary | ICD-10-CM

## 2023-07-22 DIAGNOSIS — R809 Proteinuria, unspecified: Secondary | ICD-10-CM

## 2023-07-22 DIAGNOSIS — E039 Hypothyroidism, unspecified: Secondary | ICD-10-CM | POA: Diagnosis not present

## 2023-07-22 LAB — POCT GLYCOSYLATED HEMOGLOBIN (HGB A1C): Hemoglobin A1C: 6.6 % — AB (ref 4.0–5.6)

## 2023-07-22 NOTE — Progress Notes (Signed)
 Established Patient Office Visit     CC/Reason for Visit: Follow-up chronic conditions, discuss acute concern  HPI: Tasha Garrett is a 73 y.o. female who is coming in today for the above mentioned reasons. Past Medical History is significant for: Hypertension, hyperlipidemia, type 2 diabetes, chronic kidney disease stage III, hypothyroidism, coronary artery disease, carotid artery disease with recent left carotid endarterectomy.  Has been feeling well.  For about a month she has noticed a cystlike structure of top of her right foot.  Not painful.   Past Medical/Surgical History: Past Medical History:  Diagnosis Date   Arthritis    Blood in stool    Carotid artery occlusion    Chronic kidney disease    stage 3A   Coronary arteriosclerosis    Diabetes mellitus without complication (HCC)    type 2, no meds, does not check blood sugar per pt on 04/27/23   High cholesterol    Hypertension    Hypothyroid    Myocardial infarction (HCC) 2012   in [place]   Urinary tract infection     Past Surgical History:  Procedure Laterality Date   ABDOMINAL HYSTERECTOMY     COLONOSCOPY     CORONARY ARTERY BYPASS GRAFT  05/13/2011   CABG x3: LIMA-mid LAD, SVG-OM, SVG-RCA ([facility])   EYE SURGERY Bilateral    cataracts removed   TRANSCAROTID ARTERY REVASCULARIZATION  Left 05/05/2023   Procedure: LEFT TRANSCAROTID ARTERY REVASCULARIZATION (TCAR);  Surgeon: Gretta Lonni PARAS, MD;  Location: [facility] OR;  Service: Vascular;  Laterality: Left;    Social History:  reports that she quit smoking about 13 years ago. Her smoking use included cigarettes. She started smoking about 33 years ago. She has a 10 pack-year smoking history. She has never used smokeless tobacco. She reports that she does not currently use alcohol. She reports that she does not currently use drugs.  Allergies: No Known Allergies  Family History:  Family History  Problem Relation Age of Onset   ALS Mother       Current Outpatient Medications:    amLODipine  (NORVASC ) 10 MG tablet, TAKE 1 TABLET EVERY DAY, Disp: 90 tablet, Rfl: 1   aspirin  EC 81 MG tablet, Take 81 mg by mouth in the morning., Disp: , Rfl:    atorvastatin  (LIPITOR) 80 MG tablet, Take 1 tablet (80 mg total) by mouth at bedtime., Disp: 90 tablet, Rfl: 3   clopidogrel  (PLAVIX ) 75 MG tablet, Take 1 tablet (75 mg total) by mouth daily., Disp: 30 tablet, Rfl: 6   Cod Liver Oil CAPS, Take 1 capsule by mouth in the morning., Disp: , Rfl:    isosorbide  mononitrate (IMDUR ) 30 MG 24 hr tablet, TAKE 1 TABLET EVERY DAY, Disp: 90 tablet, Rfl: 1   latanoprost  (XALATAN ) 0.005 % ophthalmic solution, Place 1 drop into both eyes at bedtime., Disp: 7.5 mL, Rfl: 4   levothyroxine  (SYNTHROID ) 137 MCG tablet, Take 1 tablet (137 mcg total) by mouth daily., Disp: 90 tablet, Rfl: 1   losartan  (COZAAR ) 100 MG tablet, TAKE 1 TABLET (100 MG TOTAL) BY MOUTH DAILY., Disp: 90 tablet, Rfl: 1   metoprolol  tartrate (LOPRESSOR ) 25 MG tablet, Take 1 tablet (25 mg total) by mouth 2 (two) times daily., Disp: 180 tablet, Rfl: 1   Multiple Vitamin (MULTIVITAMIN WITH MINERALS) TABS tablet, Take 1 tablet by mouth in the morning., Disp: , Rfl:   Review of Systems:  Negative unless indicated in HPI.   Physical Exam: Vitals:   07/22/23  1250 07/22/23 1302  BP: (!) 140/70 128/70  Pulse: 64   Temp: 98.4 F (36.9 C)   SpO2: 97%   Weight: 155 lb 14.4 oz (70.7 kg)   Height: 5' 1 (1.549 m)     Body mass index is 29.46 kg/m.   Physical Exam Vitals reviewed.  Constitutional:      Appearance: Normal appearance.  HENT:     Head: Normocephalic and atraumatic.  Eyes:     Conjunctiva/sclera: Conjunctivae normal.  Cardiovascular:     Rate and Rhythm: Normal rate and regular rhythm.  Pulmonary:     Effort: Pulmonary effort is normal.     Breath sounds: Normal breath sounds.  Feet:     Comments: Cystlike structure to the top of her right foot Skin:    General:  Skin is warm and dry.  Neurological:     General: No focal deficit present.     Mental Status: She is alert and oriented to person, place, and time.  Psychiatric:        Mood and Affect: Mood normal.        Behavior: Behavior normal.        Thought Content: Thought content normal.        Judgment: Judgment normal.      Impression and Plan:  Type 2 diabetes mellitus with other circulatory complication, without long-term current use of insulin (HCC) Assessment & Plan: Well-controlled on current with an A1c of 6.6 today.  Orders: -     POCT glycosylated hemoglobin (Hb A1C) -     Microalbumin / creatinine urine ratio; Future  Microalbuminuria due to type 2 diabetes mellitus (HCC) Assessment & Plan: On losartan .   Stage 3 chronic kidney disease, unspecified whether stage 3a or 3b CKD (HCC) Assessment & Plan: Followed by nephrology.  Baseline creatinine around 1.2-1.4.   Essential hypertension Assessment & Plan: Fairly well-controlled on current.   Hyperlipidemia with target LDL less than 70 Assessment & Plan: Recent LDL 64 on atorvastatin .   Acquired hypothyroidism Assessment & Plan: On levothyroxine  with a stable TSH at 1.660.   Cyst of bone of right foot -     Ambulatory referral to Podiatry     Time spent:32 minutes reviewing chart, interviewing and examining patient and formulating plan of care.     Tully Theophilus Andrews, MD [facility]

## 2023-07-22 NOTE — Assessment & Plan Note (Signed)
 On levothyroxine with a stable TSH at 1.660.

## 2023-07-22 NOTE — Assessment & Plan Note (Signed)
 Well-controlled on current with an A1c of 6.6 today.

## 2023-07-22 NOTE — Addendum Note (Signed)
 Addended by: KATHRYNE MILLMAN B on: 07/22/2023 01:41 PM   Modules accepted: Orders

## 2023-07-22 NOTE — Assessment & Plan Note (Signed)
 On losartan

## 2023-07-22 NOTE — Assessment & Plan Note (Signed)
 Recent LDL 64 on atorvastatin .

## 2023-07-22 NOTE — Assessment & Plan Note (Signed)
Followed by nephrology.  Baseline creatinine around 1.2-1.4.

## 2023-07-22 NOTE — Assessment & Plan Note (Signed)
Fairly well-controlled on current.

## 2023-07-30 DIAGNOSIS — H401131 Primary open-angle glaucoma, bilateral, mild stage: Secondary | ICD-10-CM | POA: Diagnosis not present

## 2023-07-30 DIAGNOSIS — H04123 Dry eye syndrome of bilateral lacrimal glands: Secondary | ICD-10-CM | POA: Diagnosis not present

## 2023-07-30 DIAGNOSIS — H524 Presbyopia: Secondary | ICD-10-CM | POA: Diagnosis not present

## 2023-07-30 DIAGNOSIS — H353131 Nonexudative age-related macular degeneration, bilateral, early dry stage: Secondary | ICD-10-CM | POA: Diagnosis not present

## 2023-08-03 ENCOUNTER — Other Ambulatory Visit (HOSPITAL_COMMUNITY): Payer: Self-pay

## 2023-08-03 ENCOUNTER — Telehealth: Payer: Self-pay | Admitting: *Deleted

## 2023-08-03 MED ORDER — CLOPIDOGREL BISULFATE 75 MG PO TABS
75.0000 mg | ORAL_TABLET | Freq: Every day | ORAL | 2 refills | Status: DC
  Filled 2023-08-03 – 2023-08-16 (×3): qty 90, 90d supply, fill #0

## 2023-08-03 NOTE — Telephone Encounter (Signed)
 Patient is requesting refills:  Plavix  to go to [facility]  to go to mail order.  Okay to refill?

## 2023-08-03 NOTE — Telephone Encounter (Signed)
 Copied from CRM [id number]. Topic: Clinical - Medication Question >> Aug 03, 2023 10:20 AM Aleatha C wrote: Reason for CRM: Patient would like a call back regarding her medications the latanoprost  (XALATAN ) 0.005 % ophthalmic solution and clopidogrel  (PLAVIX ) 75 MG tablet she has a few question [phone]

## 2023-08-03 NOTE — Telephone Encounter (Signed)
 Refill of Plavix  was sent and patient is aware.

## 2023-08-05 ENCOUNTER — Ambulatory Visit (INDEPENDENT_AMBULATORY_CARE_PROVIDER_SITE_OTHER)

## 2023-08-05 ENCOUNTER — Encounter: Payer: Self-pay | Admitting: Podiatry

## 2023-08-05 ENCOUNTER — Ambulatory Visit: Admitting: Podiatry

## 2023-08-05 DIAGNOSIS — M7751 Other enthesopathy of right foot: Secondary | ICD-10-CM | POA: Diagnosis not present

## 2023-08-05 DIAGNOSIS — M79671 Pain in right foot: Secondary | ICD-10-CM | POA: Diagnosis not present

## 2023-08-05 DIAGNOSIS — M85679 Other cyst of bone, unspecified ankle and foot: Secondary | ICD-10-CM

## 2023-08-05 DIAGNOSIS — M779 Enthesopathy, unspecified: Secondary | ICD-10-CM

## 2023-08-05 MED ORDER — TRIAMCINOLONE ACETONIDE 10 MG/ML IJ SUSP
10.0000 mg | Freq: Once | INTRAMUSCULAR | Status: AC
  Administered 2023-08-05: 10 mg via INTRA_ARTICULAR

## 2023-08-05 NOTE — Progress Notes (Signed)
 Subjective:   Patient ID: Tasha Garrett, female   DOB: 73 y.o.   MRN: [id number]   HPI Patient states she has had a lot of pain on top of her right foot and she had gone to a cookout around 30 days ago was on her foot quite a bit and it started hurting after that.  Patient does not smoke likes to be active   Review of Systems  All other systems reviewed and are negative.       Objective:  Physical Exam Vitals and nursing note reviewed.  Constitutional:      Appearance: She is well-developed.  Pulmonary:     Effort: Pulmonary effort is normal.  Musculoskeletal:        General: Normal range of motion.  Skin:    General: Skin is warm.  Neurological:     Mental Status: She is alert.     Neurovascular status was found to be intact muscle strength was found to be adequate range of motion within normal limits with patient noted to have quite a bit of discomfort on the dorsum of the right foot around the midtarsal joint with fluid buildup localized to this area     Assessment:  Probability for tendinitis with irritation of the midtarsal joint right with no other pathology noted     Plan:  H&P x-rays reviewed and went ahead today I recommend an injection explained risk of cortisone injection she wants this done sterile prep and injected dorsally 3 mg dexamethasone  Kenalog  5 mg Xylocaine  applied ice therapy reappoint to recheck  X-rays indicate there is no signs of high-level arthritis or stress fracture associated with pain patient is experiencing over the last 30 days

## 2023-08-09 ENCOUNTER — Other Ambulatory Visit (HOSPITAL_COMMUNITY): Payer: Self-pay

## 2023-08-09 ENCOUNTER — Other Ambulatory Visit: Payer: Self-pay | Admitting: Internal Medicine

## 2023-08-09 ENCOUNTER — Other Ambulatory Visit: Payer: Self-pay

## 2023-08-09 MED ORDER — ATORVASTATIN CALCIUM 80 MG PO TABS
80.0000 mg | ORAL_TABLET | Freq: Every day | ORAL | 3 refills | Status: DC
  Filled 2023-08-09: qty 90, 90d supply, fill #0

## 2023-08-10 ENCOUNTER — Other Ambulatory Visit (HOSPITAL_COMMUNITY): Payer: Self-pay

## 2023-08-16 ENCOUNTER — Other Ambulatory Visit: Payer: Self-pay

## 2023-08-16 ENCOUNTER — Other Ambulatory Visit (HOSPITAL_COMMUNITY): Payer: Self-pay

## 2023-08-18 DIAGNOSIS — H524 Presbyopia: Secondary | ICD-10-CM | POA: Diagnosis not present

## 2023-08-24 ENCOUNTER — Inpatient Hospital Stay (HOSPITAL_COMMUNITY)
Admission: EM | Admit: 2023-08-24 | Discharge: 2023-08-27 | DRG: 324 | Disposition: A | Discharge status: Home / Self Care | Attending: Family Medicine | Admitting: Family Medicine

## 2023-08-24 ENCOUNTER — Other Ambulatory Visit: Payer: Self-pay

## 2023-08-24 ENCOUNTER — Emergency Department (HOSPITAL_COMMUNITY)

## 2023-08-24 ENCOUNTER — Encounter (HOSPITAL_COMMUNITY): Payer: Self-pay

## 2023-08-24 DIAGNOSIS — I129 Hypertensive chronic kidney disease with stage 1 through stage 4 chronic kidney disease, or unspecified chronic kidney disease: Secondary | ICD-10-CM | POA: Diagnosis present

## 2023-08-24 DIAGNOSIS — E119 Type 2 diabetes mellitus without complications: Secondary | ICD-10-CM

## 2023-08-24 DIAGNOSIS — N183 Chronic kidney disease, stage 3 unspecified: Secondary | ICD-10-CM | POA: Diagnosis present

## 2023-08-24 DIAGNOSIS — I25110 Atherosclerotic heart disease of native coronary artery with unstable angina pectoris: Principal | ICD-10-CM | POA: Diagnosis present

## 2023-08-24 DIAGNOSIS — Z951 Presence of aortocoronary bypass graft: Secondary | ICD-10-CM

## 2023-08-24 DIAGNOSIS — Z5941 Food insecurity: Secondary | ICD-10-CM | POA: Diagnosis not present

## 2023-08-24 DIAGNOSIS — E039 Hypothyroidism, unspecified: Secondary | ICD-10-CM | POA: Diagnosis present

## 2023-08-24 DIAGNOSIS — I517 Cardiomegaly: Secondary | ICD-10-CM | POA: Diagnosis not present

## 2023-08-24 DIAGNOSIS — Z955 Presence of coronary angioplasty implant and graft: Secondary | ICD-10-CM

## 2023-08-24 DIAGNOSIS — Z7982 Long term (current) use of aspirin: Secondary | ICD-10-CM | POA: Diagnosis not present

## 2023-08-24 DIAGNOSIS — I200 Unstable angina: Principal | ICD-10-CM | POA: Diagnosis present

## 2023-08-24 DIAGNOSIS — R079 Chest pain, unspecified: Secondary | ICD-10-CM | POA: Diagnosis present

## 2023-08-24 DIAGNOSIS — N1831 Chronic kidney disease, stage 3a: Secondary | ICD-10-CM | POA: Diagnosis present

## 2023-08-24 DIAGNOSIS — Z820 Family history of epilepsy and other diseases of the nervous system: Secondary | ICD-10-CM

## 2023-08-24 DIAGNOSIS — Z87891 Personal history of nicotine dependence: Secondary | ICD-10-CM | POA: Diagnosis not present

## 2023-08-24 DIAGNOSIS — Z79890 Hormone replacement therapy (postmenopausal): Secondary | ICD-10-CM

## 2023-08-24 DIAGNOSIS — I252 Old myocardial infarction: Secondary | ICD-10-CM | POA: Diagnosis not present

## 2023-08-24 DIAGNOSIS — E7800 Pure hypercholesterolemia, unspecified: Secondary | ICD-10-CM | POA: Diagnosis present

## 2023-08-24 DIAGNOSIS — E1159 Type 2 diabetes mellitus with other circulatory complications: Secondary | ICD-10-CM | POA: Diagnosis not present

## 2023-08-24 DIAGNOSIS — Z79899 Other long term (current) drug therapy: Secondary | ICD-10-CM | POA: Diagnosis not present

## 2023-08-24 DIAGNOSIS — E1122 Type 2 diabetes mellitus with diabetic chronic kidney disease: Secondary | ICD-10-CM | POA: Diagnosis present

## 2023-08-24 DIAGNOSIS — M1990 Unspecified osteoarthritis, unspecified site: Secondary | ICD-10-CM | POA: Diagnosis present

## 2023-08-24 DIAGNOSIS — Z7902 Long term (current) use of antithrombotics/antiplatelets: Secondary | ICD-10-CM | POA: Diagnosis not present

## 2023-08-24 DIAGNOSIS — I10 Essential (primary) hypertension: Secondary | ICD-10-CM | POA: Diagnosis not present

## 2023-08-24 LAB — CBC
HCT: 40.2 % (ref 36.0–46.0)
Hemoglobin: 12.2 g/dL (ref 12.0–15.0)
MCH: 28 pg (ref 26.0–34.0)
MCHC: 30.3 g/dL (ref 30.0–36.0)
MCV: 92.2 fL (ref 80.0–100.0)
Platelets: 359 K/uL (ref 150–400)
RBC: 4.36 MIL/uL (ref 3.87–5.11)
RDW: 12.8 % (ref 11.5–15.5)
WBC: 9.3 K/uL (ref 4.0–10.5)
nRBC: 0 % (ref 0.0–0.2)

## 2023-08-24 LAB — BASIC METABOLIC PANEL WITH GFR
Anion gap: 10 (ref 5–15)
BUN: 32 mg/dL — ABNORMAL HIGH (ref 8–23)
CO2: 22 mmol/L (ref 22–32)
Calcium: 10.4 mg/dL — ABNORMAL HIGH (ref 8.9–10.3)
Chloride: 105 mmol/L (ref 98–111)
Creatinine, Ser: 1.38 mg/dL — ABNORMAL HIGH (ref 0.44–1.00)
GFR, Estimated: 40 mL/min — ABNORMAL LOW (ref >60)
Glucose, Bld: 212 mg/dL — ABNORMAL HIGH (ref 70–99)
Potassium: 4.9 mmol/L (ref 3.5–5.1)
Sodium: 137 mmol/L (ref 135–145)

## 2023-08-24 LAB — TROPONIN I (HIGH SENSITIVITY): Troponin I (High Sensitivity): 36 ng/L — ABNORMAL HIGH (ref <18)

## 2023-08-24 NOTE — ED Triage Notes (Signed)
 Pt came in via POV d/t chest tightness intermittently with some difficulty breathing. Has been sweating more than usual, pain radiates top her back. A/Ox4, rates her pain 8/10 during triage. Denies n/v.

## 2023-08-24 NOTE — ED Notes (Signed)
 Pt ambulated to restroom with minimal assistance.

## 2023-08-24 NOTE — ED Triage Notes (Signed)
 Pt c/o chest tightness and sob that began this afternoon denies nausea, denies dizziness

## 2023-08-24 NOTE — ED Notes (Signed)
 ED Provider at bedside.

## 2023-08-24 NOTE — ED Provider Notes (Signed)
 [facility] EM[facility] AT [facility] Provider Note   CSN: [id number] Arrival date & time: 08/24/23  1814     Patient presents with: Chest Pain   Tasha Garrett is a 73 y.o. female.   Patient presents to the emergency department for evaluation of chest pain.  Patient reports that the symptoms began around 1:30 PM today.  She was experiencing tightness in her chest that radiated up to the throat area.  Symptoms would wax and wane, she could not identify anything that caused the symptoms.  She did feel short of breath when the tightness was present.  She reports, however, that currently her symptoms have resolved.  Patient does have a cardiac history, having had a heart attack in 2012 and underwent bypass surgery of 3 vessels.  She does not have a local cardiologist and has not had any cardiac studies since her surgery.       Prior to Admission medications   Medication Sig Start Date End Date Taking? Authorizing Provider  amLODipine  (NORVASC ) 10 MG tablet TAKE 1 TABLET EVERY DAY 11/23/22   Theophilus Andrews, Tully GRADE, MD  aspirin  EC 81 MG tablet Take 81 mg by mouth in the morning.    [provider]  atorvastatin  (LIPITOR ) 80 MG tablet Take 1 tablet (80 mg total) by mouth at bedtime. 05/06/23   Rhyne, Samantha J, PA-C  atorvastatin  (LIPITOR ) 80 MG tablet Take 1 tablet (80 mg total) by mouth at bedtime. 05/06/23   Rhyne, Samantha J, PA-C  atorvastatin  (LIPITOR ) 80 MG tablet Take 1 tablet (80 mg total) by mouth at bedtime. 05/06/23   Rhyne, Samantha J, PA-C  brimonidine  (ALPHAGAN ) 0.2 % ophthalmic solution 3 (three) times daily.    [provider]  clopidogrel  (PLAVIX ) 75 MG tablet Take 1 tablet (75 mg total) by mouth daily. 08/03/23   Theophilus Andrews, Tully GRADE, MD  Cod Liver Oil CAPS Take 1 capsule by mouth in the morning.    [provider]  isosorbide  mononitrate (IMDUR ) 30 MG 24 hr tablet TAKE 1 TABLET EVERY DAY 06/30/23   Theophilus Andrews, Tully GRADE, MD  latanoprost  (XALATAN ) 0.005 % ophthalmic solution Place 1 drop into both eyes at bedtime. 12/17/22     levothyroxine  (SYNTHROID ) 137 MCG tablet Take 1 tablet (137 mcg total) by mouth daily. 11/23/22   Theophilus Andrews, Tully GRADE, MD  losartan  (COZAAR ) 100 MG tablet TAKE 1 TABLET (100 MG TOTAL) BY MOUTH DAILY. 06/30/23   Theophilus Andrews, Tully GRADE, MD  metoprolol  tartrate (LOPRESSOR ) 25 MG tablet Take 1 tablet (25 mg total) by mouth 2 (two) times daily. 04/20/23   Theophilus Andrews, Tully GRADE, MD  Multiple Vitamin (MULTIVITAMIN WITH MINERALS) TABS tablet Take 1 tablet by mouth in the morning.    [provider]    Allergies: Patient has no known allergies.    Review of Systems  Updated Vital Signs BP (!) 142/63   Pulse 73   Temp 97.9 F (36.6 C) (Oral)   Resp (!) 22   Ht 5' 1 (1.549 m)   Wt 69.4 kg   SpO2 97%   BMI 28.91 kg/m   Physical Exam Vitals and nursing note reviewed.  Constitutional:      General: She is not in acute distress.    Appearance: She is well-developed.  HENT:     Head: Normocephalic and atraumatic.     Mouth/Throat:     Mouth: Mucous membranes are moist.  Eyes:     General:  Vision grossly intact. Gaze aligned appropriately.     Extraocular Movements: Extraocular movements intact.     Conjunctiva/sclera: Conjunctivae normal.  Cardiovascular:     Rate and Rhythm: Normal rate and regular rhythm.     Pulses: Normal pulses.     Heart sounds: Normal heart sounds, S1 normal and S2 normal. No murmur heard.    No friction rub. No gallop.  Pulmonary:     Effort: Pulmonary effort is normal. No respiratory distress.     Breath sounds: Normal breath sounds.  Abdominal:     General: Bowel sounds are normal.     Palpations: Abdomen is soft.     Tenderness: There is no abdominal tenderness. There is no guarding or rebound.     Hernia: No hernia is present.  Musculoskeletal:        General: No swelling.     Cervical back: Full passive range of motion  without pain, normal range of motion and neck supple. No spinous process tenderness or muscular tenderness. Normal range of motion.     Right lower leg: No edema.     Left lower leg: No edema.  Skin:    General: Skin is warm and dry.     Capillary Refill: Capillary refill takes less than 2 seconds.     Findings: No ecchymosis, erythema, rash or wound.  Neurological:     General: No focal deficit present.     Mental Status: She is alert and oriented to person, place, and time.     GCS: GCS eye subscore is 4. GCS verbal subscore is 5. GCS motor subscore is 6.     Cranial Nerves: Cranial nerves 2-12 are intact.     Sensory: Sensation is intact.     Motor: Motor function is intact.     Coordination: Coordination is intact.  Psychiatric:        Attention and Perception: Attention normal.        Mood and Affect: Mood normal.        Speech: Speech normal.        Behavior: Behavior normal.     (all labs ordered are listed, but only abnormal results are displayed) Labs Reviewed  BASIC METABOLIC PANEL WITH GFR - Abnormal; Notable for the following components:      Result Value   Glucose, Bld 212 (*)    BUN 32 (*)    Creatinine, Ser 1.38 (*)    Calcium  10.4 (*)    GFR, Estimated 40 (*)    All other components within normal limits  TROPONIN I (HIGH SENSITIVITY) - Abnormal; Notable for the following components:   Troponin I (High Sensitivity) 36 (*)    All other components within normal limits  TROPONIN I (HIGH SENSITIVITY) - Abnormal; Notable for the following components:   Troponin I (High Sensitivity) 31 (*)    All other components within normal limits  CBC    EKG: EKG Interpretation Date/Time:  Tuesday August 24 2023 18:22:57 EDT Ventricular Rate:  72 PR Interval:  174 QRS Duration:  68 QT Interval:  388 QTC Calculation: 424 R Axis:   27  Text Interpretation: Normal sinus rhythm with sinus pause Anterior infarct , age undetermined Abnormal ECG When compared with ECG of  27-Apr-2023 14:53, PREVIOUS ECG IS PRESENT Confirmed by Haze Lonni PARAS [phone]) on 08/24/2023 11:13:43 PM  Radiology: [facility] Chest 2 View Result Date: 08/24/2023 CLINICAL DATA:  Chest pain EXAM: CHEST - 2 VIEW COMPARISON:  Chest x-ray 02/26/2021 FINDINGS:  The heart is enlarged, unchanged. Patient is status post cardiac surgery. Lungs are clear. There is no pleural effusion or pneumothorax. No acute fractures are seen. A IMPRESSION: 1. No active cardiopulmonary disease. 2. Cardiomegaly. Electronically Signed   By: Greig Pique M.D.   On: 08/24/2023 20:11     Procedures   Medications Ordered in the ED  aspirin  chewable tablet 324 mg (has no administration in time range)                                    Medical Decision Making Amount and/or Complexity of Data Reviewed Labs: ordered. Decision-making details documented in ED Course. Radiology: ordered and independent interpretation performed. Decision-making details documented in ED Course. ECG/medicine tests: ordered and independent interpretation performed. Decision-making details documented in ED Course.  Risk OTC drugs. Decision regarding hospitalization.   Differential Diagnosis considered includes, but not limited to: STEMI; NSTEMI; myocarditis; pericarditis; pulmonary embolism; aortic dissection; pneumothorax; pneumonia; gastritis; musculoskeletal pain  Presents to the emergency department for evaluation of an episode of chest pain that occurred for a large portion of the afternoon and evening tonight.  Patient does have a cardiac history, having had three-vessel bypass in 2012.  Patient has not had any recurrent cardiac problems since then, does not follow with cardiology.  At time of my evaluation, patient reports that symptoms have resolved.  At rest here in the exam room she is not having any shortness of breath or chest discomfort.  Troponins are mildly elevated.  Discussed with Dr. Otelia, on-call for cardiology.  He has  seen and evaluated the patient.  Recommending medicine admission, treatment for unstable angina with aspirin , heparin  and plan for cardiac catheterization later today.  CRITICAL CARE Performed by: Lonni JINNY Seats   Total critical care time: 30 minutes  Critical care time was exclusive of separately billable procedures and treating other patients.  Critical care was necessary to treat or prevent imminent or life-threatening deterioration.  Critical care was time spent personally by me on the following activities: development of treatment plan with patient and/or surrogate as well as nursing, discussions with consultants, evaluation of patient's response to treatment, examination of patient, obtaining history from patient or surrogate, ordering and performing treatments and interventions, ordering and review of laboratory studies, ordering and review of radiographic studies, pulse oximetry and re-evaluation of patient's condition.      Final diagnoses:  Unstable angina [facility])    ED Discharge Orders     None          Linville Decarolis, Lonni JINNY, MD 08/25/23 0100

## 2023-08-24 NOTE — ED Provider Triage Note (Signed)
 Emergency Medicine Provider Triage Evaluation Note  Sharena Dibenedetto , a 73 y.o. female  was evaluated in triage.  Pt complains of chest pain, shob this afternoon intermittently, starting at 1. Hx of bypass, hld, HTN, CKD.  Review of Systems  Positive: Chest pain, shob Negative:   Physical Exam  BP (!) 147/71 (BP Location: Right Arm)   Pulse 72   Temp 98 F (36.7 C)   Resp (!) 27   SpO2 99%  Gen:   Awake, no distress   Resp:  Normal effort  MSK:   Moves extremities without difficulty  Other:    Medical Decision Making  Medically screening exam initiated at 6:37 PM.  Appropriate orders placed.  Thursa Emme was informed that the remainder of the evaluation will be completed by another provider, this initial triage assessment does not replace that evaluation, and the importance of remaining in the ED until their evaluation is complete.  Workup initiated in triage    Rosan Sherlean VEAR DEVONNA 08/24/23 8161

## 2023-08-25 ENCOUNTER — Encounter (HOSPITAL_COMMUNITY)
Admission: EM | Disposition: A | Discharge status: Home / Self Care | Payer: Self-pay | Source: Home / Self Care | Attending: Family Medicine

## 2023-08-25 ENCOUNTER — Encounter (HOSPITAL_COMMUNITY): Payer: Self-pay | Admitting: Family Medicine

## 2023-08-25 DIAGNOSIS — I200 Unstable angina: Principal | ICD-10-CM | POA: Diagnosis present

## 2023-08-25 DIAGNOSIS — E1122 Type 2 diabetes mellitus with diabetic chronic kidney disease: Secondary | ICD-10-CM | POA: Diagnosis present

## 2023-08-25 DIAGNOSIS — Z7902 Long term (current) use of antithrombotics/antiplatelets: Secondary | ICD-10-CM | POA: Diagnosis not present

## 2023-08-25 DIAGNOSIS — I252 Old myocardial infarction: Secondary | ICD-10-CM | POA: Diagnosis not present

## 2023-08-25 DIAGNOSIS — Z820 Family history of epilepsy and other diseases of the nervous system: Secondary | ICD-10-CM | POA: Diagnosis not present

## 2023-08-25 DIAGNOSIS — I129 Hypertensive chronic kidney disease with stage 1 through stage 4 chronic kidney disease, or unspecified chronic kidney disease: Secondary | ICD-10-CM | POA: Diagnosis present

## 2023-08-25 DIAGNOSIS — M1990 Unspecified osteoarthritis, unspecified site: Secondary | ICD-10-CM | POA: Diagnosis present

## 2023-08-25 DIAGNOSIS — Z951 Presence of aortocoronary bypass graft: Secondary | ICD-10-CM | POA: Diagnosis not present

## 2023-08-25 DIAGNOSIS — E1159 Type 2 diabetes mellitus with other circulatory complications: Secondary | ICD-10-CM

## 2023-08-25 DIAGNOSIS — N1831 Chronic kidney disease, stage 3a: Secondary | ICD-10-CM

## 2023-08-25 DIAGNOSIS — Z79899 Other long term (current) drug therapy: Secondary | ICD-10-CM | POA: Diagnosis not present

## 2023-08-25 DIAGNOSIS — I10 Essential (primary) hypertension: Secondary | ICD-10-CM | POA: Diagnosis not present

## 2023-08-25 DIAGNOSIS — R079 Chest pain, unspecified: Secondary | ICD-10-CM | POA: Diagnosis present

## 2023-08-25 DIAGNOSIS — I25110 Atherosclerotic heart disease of native coronary artery with unstable angina pectoris: Principal | ICD-10-CM

## 2023-08-25 DIAGNOSIS — E039 Hypothyroidism, unspecified: Secondary | ICD-10-CM | POA: Diagnosis present

## 2023-08-25 DIAGNOSIS — Z7982 Long term (current) use of aspirin: Secondary | ICD-10-CM | POA: Diagnosis not present

## 2023-08-25 DIAGNOSIS — Z87891 Personal history of nicotine dependence: Secondary | ICD-10-CM | POA: Diagnosis not present

## 2023-08-25 DIAGNOSIS — E7800 Pure hypercholesterolemia, unspecified: Secondary | ICD-10-CM | POA: Diagnosis present

## 2023-08-25 DIAGNOSIS — Z79890 Hormone replacement therapy (postmenopausal): Secondary | ICD-10-CM | POA: Diagnosis not present

## 2023-08-25 DIAGNOSIS — Z5941 Food insecurity: Secondary | ICD-10-CM | POA: Diagnosis not present

## 2023-08-25 HISTORY — PX: LEFT HEART CATH AND CORONARY ANGIOGRAPHY: CATH118249

## 2023-08-25 LAB — BASIC METABOLIC PANEL WITH GFR
Anion gap: 10 (ref 5–15)
BUN: 29 mg/dL — ABNORMAL HIGH (ref 8–23)
CO2: 23 mmol/L (ref 22–32)
Calcium: 10.2 mg/dL (ref 8.9–10.3)
Chloride: 107 mmol/L (ref 98–111)
Creatinine, Ser: 1.4 mg/dL — ABNORMAL HIGH (ref 0.44–1.00)
GFR, Estimated: 40 mL/min — ABNORMAL LOW (ref >60)
Glucose, Bld: 240 mg/dL — ABNORMAL HIGH (ref 70–99)
Potassium: 3.9 mmol/L (ref 3.5–5.1)
Sodium: 140 mmol/L (ref 135–145)

## 2023-08-25 LAB — CBC
HCT: 38.3 % (ref 36.0–46.0)
Hemoglobin: 11.9 g/dL — ABNORMAL LOW (ref 12.0–15.0)
MCH: 28.3 pg (ref 26.0–34.0)
MCHC: 31.1 g/dL (ref 30.0–36.0)
MCV: 91.2 fL (ref 80.0–100.0)
Platelets: 335 K/uL (ref 150–400)
RBC: 4.2 MIL/uL (ref 3.87–5.11)
RDW: 12.8 % (ref 11.5–15.5)
WBC: 9.6 K/uL (ref 4.0–10.5)
nRBC: 0 % (ref 0.0–0.2)

## 2023-08-25 LAB — GLUCOSE, CAPILLARY
Glucose-Capillary: 147 mg/dL — ABNORMAL HIGH (ref 70–99)
Glucose-Capillary: 171 mg/dL — ABNORMAL HIGH (ref 70–99)

## 2023-08-25 LAB — CBG MONITORING, ED
Glucose-Capillary: 157 mg/dL — ABNORMAL HIGH (ref 70–99)
Glucose-Capillary: 130 mg/dL — ABNORMAL HIGH (ref 70–99)

## 2023-08-25 LAB — TROPONIN I (HIGH SENSITIVITY): Troponin I (High Sensitivity): 31 ng/L — ABNORMAL HIGH (ref <18)

## 2023-08-25 LAB — HEPARIN LEVEL (UNFRACTIONATED): Heparin Unfractionated: 0.34 [IU]/mL (ref 0.30–0.70)

## 2023-08-25 SURGERY — LEFT HEART CATH AND CORONARY ANGIOGRAPHY
Anesthesia: LOCAL

## 2023-08-25 MED ORDER — ASPIRIN 81 MG PO CHEW
324.0000 mg | CHEWABLE_TABLET | Freq: Once | ORAL | Status: AC
  Administered 2023-08-25 (×2): 324 mg via ORAL
  Filled 2023-08-25: qty 4

## 2023-08-25 MED ORDER — METOPROLOL TARTRATE 25 MG PO TABS
25.0000 mg | ORAL_TABLET | Freq: Two times a day (BID) | ORAL | Status: DC
  Administered 2023-08-25 – 2023-08-27 (×7): 25 mg via ORAL
  Filled 2023-08-25 (×6): qty 1

## 2023-08-25 MED ORDER — MIDAZOLAM HCL 2 MG/2ML IJ SOLN
INTRAMUSCULAR | Status: DC | PRN
  Administered 2023-08-25 (×2): 1 mg via INTRAVENOUS

## 2023-08-25 MED ORDER — ATORVASTATIN CALCIUM 80 MG PO TABS
80.0000 mg | ORAL_TABLET | Freq: Every day | ORAL | Status: DC
  Administered 2023-08-25 (×2): 80 mg via ORAL
  Filled 2023-08-25: qty 1

## 2023-08-25 MED ORDER — ISOSORBIDE MONONITRATE ER 30 MG PO TB24
30.0000 mg | ORAL_TABLET | Freq: Every day | ORAL | Status: DC
  Administered 2023-08-25 – 2023-08-27 (×4): 30 mg via ORAL
  Filled 2023-08-25 (×3): qty 1

## 2023-08-25 MED ORDER — FENTANYL CITRATE (PF) 100 MCG/2ML IJ SOLN
INTRAMUSCULAR | Status: AC
  Filled 2023-08-25: qty 2

## 2023-08-25 MED ORDER — SENNOSIDES-DOCUSATE SODIUM 8.6-50 MG PO TABS
1.0000 | ORAL_TABLET | Freq: Every evening | ORAL | Status: DC | PRN
Start: 2023-08-25 — End: 2023-08-27

## 2023-08-25 MED ORDER — VERAPAMIL HCL 2.5 MG/ML IV SOLN
INTRAVENOUS | Status: DC | PRN
  Administered 2023-08-25 (×2): 10 mL via INTRA_ARTERIAL

## 2023-08-25 MED ORDER — INSULIN ASPART 100 UNIT/ML IJ SOLN
0.0000 [IU] | INTRAMUSCULAR | Status: DC
  Administered 2023-08-25 (×4): 1 [IU] via SUBCUTANEOUS
  Administered 2023-08-26 – 2023-08-27 (×2): 2 [IU] via SUBCUTANEOUS

## 2023-08-25 MED ORDER — HEPARIN (PORCINE) 25000 UT/250ML-% IV SOLN: 750.0000 [IU]/h | INTRAVENOUS | Status: DC

## 2023-08-25 MED ORDER — HEPARIN (PORCINE) IN NACL 1000-0.9 UT/500ML-% IV SOLN
INTRAVENOUS | Status: DC | PRN
  Administered 2023-08-25 (×4): 500 mL

## 2023-08-25 MED ORDER — SODIUM CHLORIDE 0.9% FLUSH: 3.0000 mL | INTRAVENOUS | Status: DC | PRN

## 2023-08-25 MED ORDER — HEPARIN (PORCINE) 25000 UT/250ML-% IV SOLN
750.0000 [IU]/h | INTRAVENOUS | Status: DC
  Administered 2023-08-25 (×2): 750 [IU]/h via INTRAVENOUS
  Filled 2023-08-25: qty 250

## 2023-08-25 MED ORDER — HEPARIN (PORCINE) 25000 UT/250ML-% IV SOLN
900.0000 [IU]/h | INTRAVENOUS | Status: DC
  Filled 2023-08-25: qty 250

## 2023-08-25 MED ORDER — SODIUM CHLORIDE 0.9% FLUSH
3.0000 mL | Freq: Two times a day (BID) | INTRAVENOUS | Status: DC
  Administered 2023-08-26 – 2023-08-27 (×3): 3 mL via INTRAVENOUS

## 2023-08-25 MED ORDER — LIDOCAINE HCL (PF) 1 % IJ SOLN
INTRAMUSCULAR | Status: AC
  Filled 2023-08-25: qty 30

## 2023-08-25 MED ORDER — NITROGLYCERIN 0.4 MG SL SUBL: 0.4000 mg | SUBLINGUAL_TABLET | SUBLINGUAL | Status: DC | PRN

## 2023-08-25 MED ORDER — BRIMONIDINE TARTRATE 0.2 % OP SOLN
1.0000 [drp] | Freq: Two times a day (BID) | OPHTHALMIC | Status: DC
  Administered 2023-08-25 – 2023-08-26 (×3): 1 [drp] via OPHTHALMIC
  Filled 2023-08-25: qty 5

## 2023-08-25 MED ORDER — VERAPAMIL HCL 2.5 MG/ML IV SOLN
INTRAVENOUS | Status: AC
  Filled 2023-08-25: qty 2

## 2023-08-25 MED ORDER — ONDANSETRON HCL 4 MG/2ML IJ SOLN: 4.0000 mg | Freq: Four times a day (QID) | INTRAMUSCULAR | Status: DC | PRN

## 2023-08-25 MED ORDER — CLOPIDOGREL BISULFATE 75 MG PO TABS
75.0000 mg | ORAL_TABLET | Freq: Every day | ORAL | Status: DC
Start: 2023-08-25 — End: 2023-08-27
  Administered 2023-08-25 – 2023-08-27 (×4): 75 mg via ORAL
  Filled 2023-08-25 (×3): qty 1

## 2023-08-25 MED ORDER — SODIUM CHLORIDE 0.9% FLUSH
3.0000 mL | Freq: Two times a day (BID) | INTRAVENOUS | Status: DC
  Administered 2023-08-25 – 2023-08-27 (×7): 3 mL via INTRAVENOUS

## 2023-08-25 MED ORDER — SODIUM CHLORIDE 0.9 % IV SOLN: 250.0000 mL | INTRAVENOUS | Status: AC | PRN

## 2023-08-25 MED ORDER — LATANOPROST 0.005 % OP SOLN
1.0000 [drp] | Freq: Every day | OPHTHALMIC | Status: DC
  Administered 2023-08-25 (×2): 1 [drp] via OPHTHALMIC
  Filled 2023-08-25: qty 2.5

## 2023-08-25 MED ORDER — ACETAMINOPHEN 325 MG PO TABS: 650.0000 mg | ORAL_TABLET | Freq: Four times a day (QID) | ORAL | Status: DC | PRN

## 2023-08-25 MED ORDER — MIDAZOLAM HCL 2 MG/2ML IJ SOLN
INTRAMUSCULAR | Status: AC
  Filled 2023-08-25: qty 2

## 2023-08-25 MED ORDER — SODIUM CHLORIDE 0.9 % WEIGHT BASED INFUSION
1.0000 mL/kg/h | INTRAVENOUS | Status: AC
  Administered 2023-08-25 (×2): 1 mL/kg/h via INTRAVENOUS

## 2023-08-25 MED ORDER — HEPARIN BOLUS VIA INFUSION
3700.0000 [IU] | Freq: Once | INTRAVENOUS | Status: AC
  Administered 2023-08-25 (×2): 3700 [IU] via INTRAVENOUS
  Filled 2023-08-25: qty 3700

## 2023-08-25 MED ORDER — LIDOCAINE HCL (PF) 1 % IJ SOLN
INTRAMUSCULAR | Status: DC | PRN
  Administered 2023-08-25 (×2): 2 mL

## 2023-08-25 MED ORDER — SODIUM CHLORIDE 0.9 % IV SOLN: INTRAVENOUS | Status: DC

## 2023-08-25 MED ORDER — FENTANYL CITRATE (PF) 100 MCG/2ML IJ SOLN
INTRAMUSCULAR | Status: DC | PRN
  Administered 2023-08-25 (×2): 25 ug via INTRAVENOUS

## 2023-08-25 MED ORDER — ACETAMINOPHEN 650 MG RE SUPP: 650.0000 mg | Freq: Four times a day (QID) | RECTAL | Status: DC | PRN

## 2023-08-25 MED ORDER — AMLODIPINE BESYLATE 10 MG PO TABS
10.0000 mg | ORAL_TABLET | Freq: Every day | ORAL | Status: DC
  Administered 2023-08-25 – 2023-08-27 (×4): 10 mg via ORAL
  Filled 2023-08-25: qty 2
  Filled 2023-08-25 (×2): qty 1

## 2023-08-25 MED ORDER — ONDANSETRON HCL 4 MG PO TABS: 4.0000 mg | ORAL_TABLET | Freq: Four times a day (QID) | ORAL | Status: DC | PRN

## 2023-08-25 MED ORDER — HEPARIN SODIUM (PORCINE) 1000 UNIT/ML IJ SOLN
INTRAMUSCULAR | Status: DC | PRN
  Administered 2023-08-25 (×2): 3500 [IU] via INTRAVENOUS

## 2023-08-25 MED ORDER — HEPARIN SODIUM (PORCINE) 1000 UNIT/ML IJ SOLN
INTRAMUSCULAR | Status: AC
  Filled 2023-08-25: qty 10

## 2023-08-25 MED ORDER — ASPIRIN 81 MG PO TBEC
81.0000 mg | DELAYED_RELEASE_TABLET | Freq: Every morning | ORAL | Status: DC
  Administered 2023-08-25 – 2023-08-27 (×4): 81 mg via ORAL
  Filled 2023-08-25 (×3): qty 1

## 2023-08-25 MED ORDER — SODIUM CHLORIDE 0.9 % IV SOLN: INTRAVENOUS | Status: AC

## 2023-08-25 MED ORDER — OXYCODONE HCL 5 MG PO TABS: 5.0000 mg | ORAL_TABLET | ORAL | Status: DC | PRN

## 2023-08-25 MED ORDER — MORPHINE SULFATE (PF) 2 MG/ML IV SOLN: 2.0000 mg | INTRAVENOUS | Status: DC | PRN

## 2023-08-25 MED ORDER — LEVOTHYROXINE SODIUM 50 MCG PO TABS
137.0000 ug | ORAL_TABLET | Freq: Every day | ORAL | Status: DC
  Administered 2023-08-25 – 2023-08-27 (×4): 137 ug via ORAL
  Filled 2023-08-25 (×3): qty 1

## 2023-08-25 MED ORDER — IOHEXOL 350 MG/ML SOLN
INTRAVENOUS | Status: DC | PRN
  Administered 2023-08-25 (×2): 115 mL

## 2023-08-25 SURGICAL SUPPLY — 12 items
CATH INFINITI 5 FR IM (CATHETERS) IMPLANT
CATH INFINITI 5 FR JL3.5 (CATHETERS) IMPLANT
CATH INFINITI 5 FR RCB (CATHETERS) IMPLANT
CATH INFINITI 5FR AL1 (CATHETERS) IMPLANT
CATH INFINITI 5FR MULTPACK ANG (CATHETERS) IMPLANT
DEVICE RAD COMP TR BAND LRG (VASCULAR PRODUCTS) IMPLANT
GLIDESHEATH SLEND SS 6F .021 (SHEATH) IMPLANT
GUIDEWIRE INQWIRE 1.5J.035X260 (WIRE) IMPLANT
PACK CARDIAC CATHETERIZATION (CUSTOM PROCEDURE TRAY) ×1 IMPLANT
SET ATX-X65L (MISCELLANEOUS) IMPLANT
SHEATH PROBE COVER 6X72 (BAG) IMPLANT
STATION PROTECTION PRESSURIZED (MISCELLANEOUS) IMPLANT

## 2023-08-25 NOTE — H&P (Signed)
 History and Physical    Tasha Garrett FMW:[id number] DOB: Jan 09, 1951 DOA: 08/24/2023  PCP: Theophilus Andrews, Tully GRADE, MD   Patient coming from: Home  Chief Complaint: Chest discomfort   HPI: Tasha Garrett is a 73 y.o. female with medical history significant for hypertension, hyperlipidemia, type 2 diabetes mellitus, hypothyroidism, carotid artery stenosis status post left TCAR in April 2025, and CAD status post CABG in 2013 who presents with chest discomfort.  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  She then developed discomfort described as pressure-like in her chest that was associated with dyspnea.  Symptoms eventually eased off and resolved over the course of roughly 2 hours without any specific intervention.  She is unable to identify any alleviating or exacerbating factors.  There was no associated nausea or diaphoresis.  ED Course: Upon arrival to the ED, patient is found to be afebrile and saturating well on room air with normal HR and stable BP.  EKG demonstrates sinus rhythm and chest x-ray is negative for acute cardiopulmonary disease.  Labs are most notable for glucose 212, creatinine 1.38, normal CBC, and troponin 36.  Cardiology was consulted by the ED physician, patient was given 324 mg aspirin , and was started on IV heparin  infusion.  Review of Systems:  All other systems reviewed and apart from HPI, are negative.  Past Medical History:  Diagnosis Date   Arthritis    Blood in stool    Carotid artery occlusion    Chronic kidney disease    stage 3A   Coronary arteriosclerosis    Diabetes mellitus without complication (HCC)    type 2, no meds, does not check blood sugar per pt on 04/27/23   High cholesterol    Hypertension    Hypothyroid    Myocardial infarction [facility]) 2012   in [place]   Urinary tract infection     Past Surgical History:  Procedure Laterality  Date   ABDOMINAL HYSTERECTOMY     COLONOSCOPY     CORONARY ARTERY BYPASS GRAFT  05/13/2011   CABG x3: LIMA-mid LAD, SVG-OM, SVG-RCA ([facility])   EYE SURGERY Bilateral    cataracts removed   TRANSCAROTID ARTERY REVASCULARIZATION  Left 05/05/2023   Procedure: LEFT TRANSCAROTID ARTERY REVASCULARIZATION (TCAR);  Surgeon: Gretta Lonni PARAS, MD;  Location: [facility] OR;  Service: Vascular;  Laterality: Left;    Social History:   reports that she quit smoking about 13 years ago. Her smoking use included cigarettes. She started smoking about 33 years ago. She has a 10 pack-year smoking history. She has never used smokeless tobacco. She reports that she does not currently use alcohol. She reports that she does not currently use drugs.  No Known Allergies  Family History  Problem Relation Age of Onset   ALS Mother      Prior to Admission medications   Medication Sig Start Date End Date Taking? Authorizing Provider  amLODipine  (NORVASC ) 10 MG tablet TAKE 1 TABLET EVERY DAY 11/23/22  Yes Theophilus Andrews, Tully GRADE, MD  aspirin  EC 81 MG tablet Take 81 mg by mouth in the morning.   Yes [provider]  atorvastatin  (LIPITOR ) 80 MG tablet Take 1 tablet (80 mg total) by mouth at bedtime. 05/06/23  Yes Rhyne, Samantha J, PA-C  brimonidine  (ALPHAGAN ) 0.2 % ophthalmic solution Place 1 drop into both eyes in the morning and at bedtime.   Yes [provider]  clopidogrel  (PLAVIX ) 75 MG tablet Take 1 tablet (75 mg total) by mouth daily. 08/03/23  Yes Theophilus Andrews, Tully GRADE, MD  [facility] Liver Oil CAPS Take 1 capsule by mouth in the morning.   Yes [provider]  isosorbide  mononitrate (IMDUR ) 30 MG 24 hr tablet TAKE 1 TABLET EVERY DAY 06/30/23  Yes Theophilus Andrews, Tully GRADE, MD  latanoprost  (XALATAN ) 0.005 % ophthalmic solution Place 1 drop into both eyes at bedtime. 12/17/22  Yes   levothyroxine  (SYNTHROID ) 137 MCG tablet Take 1 tablet (137 mcg total) by mouth daily. 11/23/22  Yes  Theophilus Andrews, Tully GRADE, MD  losartan  (COZAAR ) 100 MG tablet TAKE 1 TABLET (100 MG TOTAL) BY MOUTH DAILY. 06/30/23  Yes Theophilus Andrews, Tully GRADE, MD  metoprolol  tartrate (LOPRESSOR ) 25 MG tablet Take 1 tablet (25 mg total) by mouth 2 (two) times daily. 04/20/23  Yes Theophilus Andrews, Tully GRADE, MD  Multiple Vitamin (MULTIVITAMIN WITH MINERALS) TABS tablet Take 1 tablet by mouth in the morning.   Yes [provider]  Polyethyl Glyc-Propyl Glyc PF (SYSTANE HYDRATION PF) 0.4-0.3 % SOLN Place 1 drop into both eyes daily as needed (dry, gritty eyes).   Yes [provider]    Physical Exam: Vitals:   08/25/23 0241 08/25/23 0300 08/25/23 0313 08/25/23 0319  BP:  (!) 127/53    Pulse: (!) 38 (!) 58 (!) 36 (!) 59  Resp: 15 19 17 17   Temp:      TempSrc:      SpO2: 98% 97% 96% 100%  Weight:      Height:        Constitutional: NAD, no pallor or diaphoresis   Eyes: PERTLA, lids and conjunctivae normal ENMT: Mucous membranes are moist. Posterior pharynx clear of any exudate or lesions.   Neck: supple, no masses  Respiratory:no wheezing, no crackles. No accessory muscle use.  Cardiovascular: S1 & S2 heard, regular rate and rhythm. Trace ankle edema.   Abdomen: No tenderness, soft. Bowel sounds active.  Musculoskeletal: no clubbing / cyanosis. No joint deformity upper and lower extremities.   Skin: no significant rashes, lesions, ulcers. Warm, dry, well-perfused. Neurologic: CN 2-12 grossly intact. Moving all extremities. Alert and oriented.  Psychiatric: Calm. Cooperative.    Labs and Imaging on Admission: I have personally reviewed following labs and imaging studies  CBC: Recent Labs  Lab 08/24/23 1906  WBC 9.3  HGB 12.2  HCT 40.2  MCV 92.2  PLT 359   Basic Metabolic Panel: Recent Labs  Lab 08/24/23 1906  NA 137  K 4.9  CL 105  CO2 22  GLUCOSE 212*  BUN 32*  CREATININE 1.38*  CALCIUM  10.4*   GFR: Estimated Creatinine Clearance: 32.3 mL/min (A) (by C-G  formula based on SCr of 1.38 mg/dL (H)). Liver Function Tests: No results for input(s): AST, ALT, ALKPHOS, BILITOT, PROT, ALBUMIN in the last 168 hours. No results for input(s): LIPASE, AMYLASE in the last 168 hours. No results for input(s): AMMONIA in the last 168 hours. Coagulation Profile: No results for input(s): INR, PROTIME in the last 168 hours. Cardiac Enzymes: No results for input(s): CKTOTAL, CKMB, CKMBINDEX, TROPONINI in the last 168 hours. BNP (last 3 results) No results for input(s): PROBNP in the last 8760 hours. HbA1C: No results for input(s): HGBA1C in the last 72 hours. CBG: No results for input(s): GLUCAP in the last 168 hours. Lipid Profile: No results for input(s): CHOL, HDL, LDLCALC, TRIG, CHOLHDL, LDLDIRECT in the last 72 hours. Thyroid Function Tests:  No results for input(s): TSH, T4TOTAL, FREET4, T3FREE, THYROIDAB in the last 72 hours. Anemia Panel: No results for input(s): VITAMINB12, FOLATE, FERRITIN, TIBC, IRON, RETICCTPCT in the last 72 hours. Urine analysis:    Component Value Date/Time   COLORURINE YELLOW 06/02/2023 1553   APPEARANCEUR CLEAR 06/02/2023 1553   LABSPEC 1.015 06/02/2023 1553   PHURINE 6.0 06/02/2023 1553   GLUCOSEU NEGATIVE 06/02/2023 1553   HGBUR SMALL (A) 06/02/2023 1553   BILIRUBINUR NEGATIVE 06/02/2023 1553   BILIRUBINUR neg 06/02/2023 1505   KETONESUR NEGATIVE 06/02/2023 1553   PROTEINUR Positive (A) 06/02/2023 1505   PROTEINUR 30 (A) 04/27/2023 1407   UROBILINOGEN 0.2 06/02/2023 1553   UROBILINOGEN 0.2 06/02/2023 1505   NITRITE NEGATIVE 06/02/2023 1553   NITRITE neg 06/02/2023 1505   LEUKOCYTESUR NEGATIVE 06/02/2023 1553   LEUKOCYTESUR Negative 06/02/2023 1505   Sepsis Labs: @LABRCNTIP (procalcitonin:4,lacticidven:4) )No results found for this or any previous visit (from the past 240 hours).   Radiological Exams on Admission: DG Chest 2 View Result  Date: 08/24/2023 CLINICAL DATA:  Chest pain EXAM: CHEST - 2 VIEW COMPARISON:  Chest x-ray 02/26/2021 FINDINGS: The heart is enlarged, unchanged. Patient is status post cardiac surgery. Lungs are clear. There is no pleural effusion or pneumothorax. No acute fractures are seen. A IMPRESSION: 1. No active cardiopulmonary disease. 2. Cardiomegaly. Electronically Signed   By: Greig Pique M.D.   On: 08/24/2023 20:11    EKG: Independently reviewed. Sinus rhythm.   Assessment/Plan   1. Unstable angina  - Appreciate cardiology consultation  - Given 324 mg ASA in ED; currently asymptomatic  - Continue IV heparin , ASA, Plavix , and Lipitor , check echo, keep NPO    2. CKD 3A  - SCr is 1.38 on admission; baseline appears to be closer to 1.1 or 1.2  - Hold losartan  for now, renally-dose medications, monitor    3. Type II DM  - A1c was 6.6% in July 2025  - Check CBGs and use low-intensity SSI for now    4. Hypertension  - Continue amlodipine  and metoprolol     5. Hypothyroidism  - Synthroid     DVT prophylaxis: IV heparin   Code Status: Full  Level of Care: Level of care: Telemetry Cardiac Family Communication: Daughter at bedside  Disposition Plan:  Patient is from: Home  Anticipated d/c is to: Home  Anticipated d/c date is: 08/27/23  Patient currently: pending echocardiogram and likely LHC   Consults called: Cardiology  Admission status: Inpatient     Evalene GORMAN Sprinkles, MD [facility]  08/25/2023, 4:05 AM

## 2023-08-25 NOTE — H&P (View-Only) (Signed)
 Progress Note  Patient Name: Tasha Garrett Date of Encounter: 08/25/2023  Primary Cardiologist:   None   Subjective   No further chest pain.  No new SOB.    Inpatient Medications    Scheduled Meds:  amLODipine   10 mg Oral Daily   aspirin  EC  81 mg Oral q AM   atorvastatin   80 mg Oral QHS   brimonidine   1 drop Both Eyes BID   clopidogrel   75 mg Oral Daily   insulin  aspart  0-6 Units Subcutaneous Q4H   isosorbide  mononitrate  30 mg Oral Daily   latanoprost   1 drop Both Eyes QHS   levothyroxine   137 mcg Oral QAC breakfast   metoprolol  tartrate  25 mg Oral BID   sodium chloride  flush  3 mL Intravenous Q12H   Continuous Infusions:  sodium chloride  50 mL/hr at 08/25/23 0500   heparin  750 Units/hr (08/25/23 0125)   PRN Meds: acetaminophen  **OR** acetaminophen , morphine  injection, nitroGLYCERIN , ondansetron  **OR** ondansetron  (ZOFRAN ) IV, oxyCODONE , senna-docusate   Vital Signs    Vitals:   08/25/23 0600 08/25/23 0618 08/25/23 0630 08/25/23 0645  BP:  (!) 157/80 (!) 156/73 (!) 153/71  Pulse: 72 75 66 71  Resp: (!) 24 (!) 23 18 17   Temp:  97.7 F (36.5 C)    TempSrc:  Oral    SpO2: 98% 99% 96% 96%  Weight:      Height:       No intake or output data in the 24 hours ending 08/25/23 0833 Filed Weights   08/24/23 1855  Weight: 69.4 kg    Telemetry     - Personally Reviewed  ECG    Sinus with PACs with probable blocked PACs in a bigeminal pattern with junctional escape beats.  - Personally Reviewed  Physical Exam   GEN: No acute distress.   Neck: No  JVD Cardiac: RRR, no murmurs, rubs, or gallops.  Respiratory: Clear  to auscultation bilaterally. GI: Soft, nontender, non-distended  MS: No  edema; No deformity. Neuro:  Nonfocal  Psych: Normal affect   Labs    Chemistry Recent Labs  Lab 08/24/23 1906 08/25/23 0511  NA 137 140  K 4.9 3.9  CL 105 107  CO2 22 23  GLUCOSE 212* 240*  BUN 32* 29*  CREATININE 1.38* 1.40*  CALCIUM  10.4* 10.2   GFRNONAA 40* 40*  ANIONGAP 10 10     Hematology Recent Labs  Lab 08/24/23 1906 08/25/23 0511  WBC 9.3 9.6  RBC 4.36 4.20  HGB 12.2 11.9*  HCT 40.2 38.3  MCV 92.2 91.2  MCH 28.0 28.3  MCHC 30.3 31.1  RDW 12.8 12.8  PLT 359 335    Cardiac EnzymesNo results for input(s): TROPONINI in the last 168 hours. No results for input(s): TROPIPOC in the last 168 hours.   BNPNo results for input(s): BNP, PROBNP in the last 168 hours.   DDimer No results for input(s): DDIMER in the last 168 hours.   Radiology    DG Chest 2 View Result Date: 08/24/2023 CLINICAL DATA:  Chest pain EXAM: CHEST - 2 VIEW COMPARISON:  Chest x-ray 02/26/2021 FINDINGS: The heart is enlarged, unchanged. Patient is status post cardiac surgery. Lungs are clear. There is no pleural effusion or pneumothorax. No acute fractures are seen. A IMPRESSION: 1. No active cardiopulmonary disease. 2. Cardiomegaly. Electronically Signed   By: Greig Pique M.D.   On: 08/24/2023 20:11    Cardiac Studies   Echo pending  Patient Profile  73 y.o. female with a hx of CAD s/p CABG in 05/2011 (LIMA-mid LAD, SVG-OM, SVG-RCA), HLD, T2DM, CKD stage 3, hypothyroidism, carotid artery disease s/p left TCAR in 04/2023 who is being seen 08/25/2023 for the evaluation of chest pain.    Assessment & Plan    CAD:  History of CABG at [facility] in 2013.  Now with chest pain.    Pain is suggestive of unstable angina.  Needs cardiac cath.  Limit dye with increased creatinine.  On DAPT post carotid procedure in the past.  On heparin .  The patient understands that risks included but are not limited to stroke (1 in 1000), death (1 in 1000), kidney failure [usually temporary] (1 in 500), bleeding (1 in 200), allergic reaction [possibly serious] (1 in 200).  The patient understands and agrees to proceed.   DM:    A1c 6.6.    Continue current therapy.     HTN:  BP is elevated in hospital but she reports OK at home.  Will follow and  adjust meds before discharge    Dyslipidemia:  LDL is 64.  No change in therapy.    CKD IIIa:  Creat is up mildly.    Bradycardia:  She has no symptoms with this.  No sustained pauses.  Will follow.     For questions or updates, please contact [facility] Please consult [email or web address] for contact info under Cardiology/STEMI.   Signed, Lynwood Schilling, MD  08/25/2023, 8:33 AM

## 2023-08-25 NOTE — ED Notes (Signed)
 Pt informed she had elevated blood glucose and was ordered insulin . Pt refusing any insulin  stating  give me water  and ill bring it down. Once you start taking it you have to stay on it pt updated on the risks of not receiving the insulin . Pt informed and still refusing.

## 2023-08-25 NOTE — Plan of Care (Signed)
   Problem: Education: Goal: Ability to describe self-care measures that may prevent or decrease complications (Diabetes Survival Skills Education) will improve Outcome: Progressing Goal: Individualized Educational Video(s) Outcome: Progressing   Problem: Coping: Goal: Ability to adjust to condition or change in health will improve Outcome: Progressing

## 2023-08-25 NOTE — Progress Notes (Signed)
 PHARMACY - ANTICOAGULATION CONSULT NOTE  Pharmacy Consult for heparin  Indication: chest pain/ACS  No Known Allergies  Patient Measurements: Height: 5' 1 (154.9 cm) Weight: 69.4 kg (153 lb) IBW/kg (Calculated) : 47.8 HEPARIN  DW (KG): 62.6  Vital Signs: Temp: 98.3 F (36.8 C) (08/13 1326) Temp Source: Oral (08/13 1326) BP: 157/66 (08/13 1516) Pulse Rate: 60 (08/13 1516)  Labs: Recent Labs    08/24/23 1906 08/24/23 2327 08/25/23 0511 08/25/23 1031  HGB 12.2  --  11.9*  --   HCT 40.2  --  38.3  --   PLT 359  --  335  --   HEPARINUNFRC  --   --   --  0.34  CREATININE 1.38*  --  1.40*  --   TROPONINIHS 36* 31*  --   --     Estimated Creatinine Clearance: 31.9 mL/min (A) (by C-G formula based on SCr of 1.4 mg/dL (H)).  Assessment: 51 yoF presented to ED with chest pain. PMH includes CAD s/p CABG in 05/2011, TCAR in 04/2023, HLD, T2DM, CKD stage 3, hypothyroidism. Pharmacy consulted to dose heparin  for ACS. Heparin  level is therapeutic at 0.34. No bleeding noted. Planning cardiac cath.   Goal of Therapy:  Heparin  level 0.3-0.7 units/ml Monitor platelets by anticoagulation protocol: Yes     Plan:  Restart heparin  gtt 750 units/hr on 8/13 at 1930 HL 8/14 0330 and daily thereafter Monitor for signs of bleeding F/up [facility] after procedure scheduled for 10:30 on 8/14   Soha Thorup BS, PharmD, BCPS Clinical Pharmacist 08/25/2023 5:03 PM  Contact: [phone] after 3 PM

## 2023-08-25 NOTE — Progress Notes (Signed)
 Progress Note  Patient Name: Tasha Garrett Date of Encounter: 08/25/2023  Primary Cardiologist:   None   Subjective   No further chest pain.  No new SOB.    Inpatient Medications    Scheduled Meds:  amLODipine   10 mg Oral Daily   aspirin  EC  81 mg Oral q AM   atorvastatin   80 mg Oral QHS   brimonidine   1 drop Both Eyes BID   clopidogrel   75 mg Oral Daily   insulin  aspart  0-6 Units Subcutaneous Q4H   isosorbide  mononitrate  30 mg Oral Daily   latanoprost   1 drop Both Eyes QHS   levothyroxine   137 mcg Oral QAC breakfast   metoprolol  tartrate  25 mg Oral BID   sodium chloride  flush  3 mL Intravenous Q12H   Continuous Infusions:  sodium chloride  50 mL/hr at 08/25/23 0500   heparin  750 Units/hr (08/25/23 0125)   PRN Meds: acetaminophen  **OR** acetaminophen , morphine  injection, nitroGLYCERIN , ondansetron  **OR** ondansetron  (ZOFRAN ) IV, oxyCODONE , senna-docusate   Vital Signs    Vitals:   08/25/23 0600 08/25/23 0618 08/25/23 0630 08/25/23 0645  BP:  (!) 157/80 (!) 156/73 (!) 153/71  Pulse: 72 75 66 71  Resp: (!) 24 (!) 23 18 17   Temp:  97.7 F (36.5 C)    TempSrc:  Oral    SpO2: 98% 99% 96% 96%  Weight:      Height:       No intake or output data in the 24 hours ending 08/25/23 0833 Filed Weights   08/24/23 1855  Weight: 69.4 kg    Telemetry     - Personally Reviewed  ECG    Sinus with PACs with probable blocked PACs in a bigeminal pattern with junctional escape beats.  - Personally Reviewed  Physical Exam   GEN: No acute distress.   Neck: No  JVD Cardiac: RRR, no murmurs, rubs, or gallops.  Respiratory: Clear  to auscultation bilaterally. GI: Soft, nontender, non-distended  MS: No  edema; No deformity. Neuro:  Nonfocal  Psych: Normal affect   Labs    Chemistry Recent Labs  Lab 08/24/23 1906 08/25/23 0511  NA 137 140  K 4.9 3.9  CL 105 107  CO2 22 23  GLUCOSE 212* 240*  BUN 32* 29*  CREATININE 1.38* 1.40*  CALCIUM  10.4* 10.2   GFRNONAA 40* 40*  ANIONGAP 10 10     Hematology Recent Labs  Lab 08/24/23 1906 08/25/23 0511  WBC 9.3 9.6  RBC 4.36 4.20  HGB 12.2 11.9*  HCT 40.2 38.3  MCV 92.2 91.2  MCH 28.0 28.3  MCHC 30.3 31.1  RDW 12.8 12.8  PLT 359 335    Cardiac EnzymesNo results for input(s): TROPONINI in the last 168 hours. No results for input(s): TROPIPOC in the last 168 hours.   BNPNo results for input(s): BNP, PROBNP in the last 168 hours.   DDimer No results for input(s): DDIMER in the last 168 hours.   Radiology    DG Chest 2 View Result Date: 08/24/2023 CLINICAL DATA:  Chest pain EXAM: CHEST - 2 VIEW COMPARISON:  Chest x-ray 02/26/2021 FINDINGS: The heart is enlarged, unchanged. Patient is status post cardiac surgery. Lungs are clear. There is no pleural effusion or pneumothorax. No acute fractures are seen. A IMPRESSION: 1. No active cardiopulmonary disease. 2. Cardiomegaly. Electronically Signed   By: Greig Pique M.D.   On: 08/24/2023 20:11    Cardiac Studies   Echo pending  Patient Profile  73 y.o. female with a hx of CAD s/p CABG in 05/2011 (LIMA-mid LAD, SVG-OM, SVG-RCA), HLD, T2DM, CKD stage 3, hypothyroidism, carotid artery disease s/p left TCAR in 04/2023 who is being seen 08/25/2023 for the evaluation of chest pain.    Assessment & Plan    CAD:  History of CABG at [facility] in 2013.  Now with chest pain.    Pain is suggestive of unstable angina.  Needs cardiac cath.  Limit dye with increased creatinine.  On DAPT post carotid procedure in the past.  On heparin .  The patient understands that risks included but are not limited to stroke (1 in 1000), death (1 in 1000), kidney failure [usually temporary] (1 in 500), bleeding (1 in 200), allergic reaction [possibly serious] (1 in 200).  The patient understands and agrees to proceed.   DM:    A1c 6.6.    Continue current therapy.     HTN:  BP is elevated in hospital but she reports OK at home.  Will follow and  adjust meds before discharge    Dyslipidemia:  LDL is 64.  No change in therapy.    CKD IIIa:  Creat is up mildly.    Bradycardia:  She has no symptoms with this.  No sustained pauses.  Will follow.     For questions or updates, please contact [facility] Please consult [email or web address] for contact info under Cardiology/STEMI.   Signed, Lynwood Schilling, MD  08/25/2023, 8:33 AM

## 2023-08-25 NOTE — Consult Note (Addendum)
 Cardiology Consultation   Patient ID: Tasha Garrett MRN: [id number]; DOB: 1950-08-13  Admit date: 08/24/2023 Date of Consult: 08/25/2023  PCP:  Theophilus Andrews, Tully GRADE, MD   [facility] Providers Cardiologist:  None   Patient Profile: Tasha Garrett is a 73 y.o. female with a hx of CAD s/p CABG in 05/2011 (LIMA-mid LAD, SVG-OM, SVG-RCA), HLD, T2DM, CKD stage 3, hypothyroidism, carotid artery disease s/p left TCAR in 04/2023 who is being seen 08/25/2023 for the evaluation of chest pain.  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 Chest pain lasted for about 2 hours and resolved spontaneously; she was not able to tell if it was exertional; however, she reported that she felt generally weaker during this episode. No associated nausea, vomiting, diaphoresis, palpitations, pre-syncope, or syncope. No prior similar episode. She reports that this pain is different than the chest pain she had with her CABG in that the chest pain prior to her CABG was more of burning. She's currently on ASA and Plavix  given her recent TCAR.   In the ED, EKG with NSR and nonspecific ST changes. Troponin 36 =>31.  Cr 1.3.   Past Medical History:  Diagnosis Date   Arthritis    Blood in stool    Carotid artery occlusion    Chronic kidney disease    stage 3A   Coronary arteriosclerosis    Diabetes mellitus without complication (HCC)    type 2, no meds, does not check blood sugar per pt on 04/27/23   High cholesterol    Hypertension    Hypothyroid    Myocardial infarction [facility]) 2012   in [place]   Urinary tract infection     Past Surgical History:  Procedure Laterality Date   ABDOMINAL HYSTERECTOMY     COLONOSCOPY     CORONARY ARTERY BYPASS GRAFT  05/13/2011   CABG x3: LIMA-mid LAD, SVG-OM, SVG-RCA ([facility])    EYE SURGERY Bilateral    cataracts removed   TRANSCAROTID ARTERY REVASCULARIZATION  Left 05/05/2023   Procedure: LEFT TRANSCAROTID ARTERY REVASCULARIZATION (TCAR);  Surgeon: Gretta Lonni PARAS, MD;  Location[facility] OR;  Service: Vascular;  Laterality: Left;     Home Medications:  Prior to Admission medications   Medication Sig Start Date End Date Taking? Authorizing Provider  amLODipine  (NORVASC ) 10 MG tablet TAKE 1 TABLET EVERY DAY 11/23/22   Theophilus Andrews, Tully GRADE, MD  aspirin  EC 81 MG tablet Take 81 mg by mouth in the morning.    [provider]  atorvastatin  (LIPITOR ) 80 MG tablet Take 1 tablet (80 mg total) by mouth at bedtime. 05/06/23   Rhyne, Samantha J, PA-C  atorvastatin  (LIPITOR ) 80 MG tablet Take 1 tablet (80 mg total) by mouth at bedtime. 05/06/23   Rhyne, Samantha J, PA-C  atorvastatin  (LIPITOR ) 80 MG tablet Take 1 tablet (80 mg total) by mouth at bedtime. 05/06/23   Rhyne, Samantha J, PA-C  brimonidine  (ALPHAGAN ) 0.2 % ophthalmic solution 3 (three) times daily.    [provider]  clopidogrel  (PLAVIX ) 75 MG tablet Take 1 tablet (75 mg total) by mouth daily. 08/03/23   Theophilus Andrews, Tully GRADE, MD  Cod Liver Oil CAPS Take 1 capsule by mouth in the morning.    [provider]  isosorbide  mononitrate (IMDUR ) 30 MG 24 hr tablet TAKE 1 TABLET EVERY DAY 06/30/23  Theophilus Andrews, Tully GRADE, MD  latanoprost  (XALATAN ) 0.005 % ophthalmic solution Place 1 drop into both eyes at bedtime. 12/17/22     levothyroxine  (SYNTHROID ) 137 MCG tablet Take 1 tablet (137 mcg total) by mouth daily. 11/23/22   Theophilus Andrews, Tully GRADE, MD  losartan  (COZAAR ) 100 MG tablet TAKE 1 TABLET (100 MG TOTAL) BY MOUTH DAILY. 06/30/23   Theophilus Andrews, Tully GRADE, MD  metoprolol  tartrate (LOPRESSOR ) 25 MG tablet Take 1 tablet (25 mg total) by mouth 2 (two) times daily. 04/20/23   Theophilus Andrews, Tully GRADE, MD  Multiple Vitamin (MULTIVITAMIN WITH MINERALS) TABS tablet Take 1 tablet by  mouth in the morning.    [provider]    Scheduled Meds:  aspirin   324 mg Oral Once   heparin   3,700 Units Intravenous Once   Continuous Infusions:  heparin      PRN Meds:   Allergies:   No Known Allergies  Social History:   Social History   Socioeconomic History   Marital status: Widowed    Spouse name: Not on file   Number of children: Not on file   Years of education: Not on file   Highest education level: 12th grade  Occupational History   Not on file  Tobacco Use   Smoking status: Former    Current packs/day: 0.00    Average packs/day: 0.5 packs/day for 20.0 years (10.0 ttl pk-yrs)    Types: Cigarettes    Start date: 04/1990    Quit date: 04/2010    Years since quitting: 13.3   Smokeless tobacco: Never   Tobacco comments:    Pt states she stopped smoking 10 years ago and at most smoked 1/2 ppd. ALS 10/12  Vaping Use   Vaping status: Never Used  Substance and Sexual Activity   Alcohol use: Not Currently   Drug use: Not Currently   Sexual activity: Not Currently    Birth control/protection: Post-menopausal  Other Topics Concern   Not on file  Social History Narrative   Not on file   Social Drivers of Health   Financial Resource Strain: Low Risk  (07/18/2023)   Overall Financial Resource Strain (CARDIA)    Difficulty of Paying Living Expenses: Not hard at all  Food Insecurity: No Food Insecurity (07/18/2023)   Hunger Vital Sign    Worried About Running Out of Food in the Last Year: Never true    Ran Out of Food in the Last Year: Never true  Recent Concern: Food Insecurity - Food Insecurity Present (05/05/2023)   Hunger Vital Sign    Worried About Running Out of Food in the Last Year: Sometimes true    Ran Out of Food in the Last Year: Never true  Transportation Needs: No Transportation Needs (07/18/2023)   PRAPARE - Administrator, Civil Service (Medical): No    Lack of Transportation (Non-Medical): No  Physical Activity:  Insufficiently Active (07/18/2023)   Exercise Vital Sign    Days of Exercise per Week: 2 days    Minutes of Exercise per Session: 20 min  Stress: No Stress Concern Present (07/18/2023)   [employer] of Occupational Health - Occupational Stress Questionnaire    Feeling of Stress: Not at all  Social Connections: Moderately Integrated (07/18/2023)   Social Connection and Isolation Panel    Frequency of Communication with Friends and Family: Three times a week    Frequency of Social Gatherings with Friends and Family: Once a week    Attends Religious  Services: 1 to 4 times per year    Active Member of Clubs or Organizations: Yes    Attends Banker Meetings: 1 to 4 times per year    Marital Status: Widowed  Intimate Partner Violence: Not At Risk (06/21/2023)   Humiliation, Afraid, Rape, and Kick questionnaire    Fear of Current or Ex-Partner: No    Emotionally Abused: No    Physically Abused: No    Sexually Abused: No    Family History:   Family History  Problem Relation Age of Onset   ALS Mother      ROS:  Please see the history of present illness.   All other ROS reviewed and negative.     Physical Exam/Data: Vitals:   08/24/23 1825 08/24/23 1855 08/24/23 2320 08/25/23 0000  BP: (!) 147/71  (!) 125/113 (!) 142/63  Pulse: 72  79 73  Resp: (!) 27  20 (!) 22  Temp: 98 F (36.7 C)  97.9 F (36.6 C)   TempSrc:   Oral   SpO2: 99%  100% 97%  Weight:  69.4 kg    Height:  5' 1 (1.549 m)     No intake or output data in the 24 hours ending 08/25/23 0107    08/24/2023    6:55 PM 07/22/2023   12:50 PM 06/22/2023   12:34 PM  Last 3 Weights  Weight (lbs) 153 lb 155 lb 14.4 oz 152 lb 4.8 oz  Weight (kg) 69.4 kg 70.716 kg 69.083 kg     Body mass index is 28.91 kg/m.  General:  Well nourished, well developed, in no acute distress HEENT: normal Neck: no JVD Vascular: Distal pulses 2+ bilaterally Cardiac:  normal S1, S2; RRR; no murmur  Lungs:  clear to auscultation  bilaterally, no wheezing, rhonchi or rales  Abd: soft, nontender, no hepatomegaly  Ext: no edema Musculoskeletal:  No deformities, BUE and BLE strength normal and equal Skin: warm and dry  Neuro:  CNs 2-12 intact, no focal abnormalities noted Psych:  Normal affect   EKG:  The EKG was personally reviewed and demonstrates:  NSR with nonspecific ST changes   Relevant CV Studies: None  Laboratory Data: High Sensitivity Troponin:   Recent Labs  Lab 08/24/23 1906 08/24/23 2327  TROPONINIHS 36* 31*     Chemistry Recent Labs  Lab 08/24/23 1906  NA 137  K 4.9  CL 105  CO2 22  GLUCOSE 212*  BUN 32*  CREATININE 1.38*  CALCIUM  10.4*  GFRNONAA 40*  ANIONGAP 10    No results for input(s): PROT, ALBUMIN, AST, ALT, ALKPHOS, BILITOT in the last 168 hours. Lipids No results for input(s): CHOL, TRIG, HDL, LABVLDL, LDLCALC, CHOLHDL in the last 168 hours.  Hematology Recent Labs  Lab 08/24/23 1906  WBC 9.3  RBC 4.36  HGB 12.2  HCT 40.2  MCV 92.2  MCH 28.0  MCHC 30.3  RDW 12.8  PLT 359   Thyroid No results for input(s): TSH, FREET4 in the last 168 hours.  BNPNo results for input(s): BNP, PROBNP in the last 168 hours.  DDimer No results for input(s): DDIMER in the last 168 hours.  Radiology/Studies:  DG Chest 2 View Result Date: 08/24/2023 CLINICAL DATA:  Chest pain EXAM: CHEST - 2 VIEW COMPARISON:  Chest x-ray 02/26/2021 FINDINGS: The heart is enlarged, unchanged. Patient is status post cardiac surgery. Lungs are clear. There is no pleural effusion or pneumothorax. No acute fractures are seen. A IMPRESSION: 1. No active cardiopulmonary  disease. 2. Cardiomegaly. Electronically Signed   By: Greig Pique M.D.   On: 08/24/2023 20:11     Assessment and Plan: Chest pain Unstable angina CAD s/p CABG 2013 Presents with chest pain that has some typical and atypical features for cardiac chest pain. Troponin is mildly elevated but flat with no  delta. EKG with nonspecific ST changes. Given her hx, risk factors, and presentation, would treat this presentation as unstable angina.  - Start heparin  gtt - ASA 325 mg. Continue ASA 81 mg and Plavix  75 mg daily [home meds]  - Continue atorvastatin  80 mg daily  - TTE in the AM - NPO for potential coronary angiogram    Risk Assessment/Risk Scores:  TIMI Risk Score for Unstable Angina or Non-ST Elevation MI:   The patient's TIMI risk score is 4, which indicates a 20% risk of all cause mortality, new or recurrent myocardial infarction or need for urgent revascularization in the next 14 days.{    For questions or updates, please contact [facility] Please consult [email or web address] for contact info under    Signed, Gillian CHRISTELLA Cass, MD  08/25/2023 1:07 AM

## 2023-08-25 NOTE — ED Notes (Signed)
 Admitting MD paged and notified of HR changes. Pt on monitor, EKG completed, md aware

## 2023-08-25 NOTE — Progress Notes (Addendum)
 PHARMACY - ANTICOAGULATION CONSULT NOTE  Pharmacy Consult for heparin  Indication: chest pain/ACS  No Known Allergies  Patient Measurements: Height: 5' 1 (154.9 cm) Weight: 69.4 kg (153 lb) IBW/kg (Calculated) : 47.8 HEPARIN  DW (KG): 62.6  Vital Signs: Temp: 97.9 F (36.6 C) (08/12 2320) Temp Source: Oral (08/12 2320) BP: 142/63 (08/13 0000) Pulse Rate: 73 (08/13 0000)  Labs: Recent Labs    08/24/23 1906 08/24/23 2327  HGB 12.2  --   HCT 40.2  --   PLT 359  --   CREATININE 1.38*  --   TROPONINIHS 36* 31*    Estimated Creatinine Clearance: 32.3 mL/min (A) (by C-G formula based on SCr of 1.38 mg/dL (H)).   Medical History: Past Medical History:  Diagnosis Date   Arthritis    Blood in stool    Carotid artery occlusion    Chronic kidney disease    stage 3A   Coronary arteriosclerosis    Diabetes mellitus without complication (HCC)    type 2, no meds, does not check blood sugar per pt on 04/27/23   High cholesterol    Hypertension    Hypothyroid    Myocardial infarction [facility]) 2012   in [place]   Urinary tract infection     Assessment: 42 yoF presented to ED with chest pain. PMH includes CAD s/p CABG in 05/2011, TCAR in 04/2023, HLD, T2DM, CKD stage 3, hypothyroidism. Pharmacy consulted to dose heparin  for ACS.  -DAPT, no oral anticoagulation with DOACs/warfarin reported -CBC stable -Trops 31  Goal of Therapy:  Heparin  level 0.3-0.7 units/ml Monitor platelets by anticoagulation protocol: Yes   Plan:  Give 3700 units bolus x 1 Start heparin  infusion at 750 units/hr Check anti-Xa level in 8 hours and daily while on heparin  Continue to monitor H&H and platelets  Lynwood Poplar, PharmD, BCPS Clinical Pharmacist 08/25/2023 1:03 AM

## 2023-08-25 NOTE — Interval H&P Note (Signed)
 History and Physical Interval Note:  08/25/2023 1:47 PM  Tasha Garrett  has presented today for surgery, with the diagnosis of nstemi.  The various methods of treatment have been discussed with the patient and family. After consideration of risks, benefits and other options for treatment, the patient has consented to  Procedure(s): LEFT HEART CATH AND CORONARY ANGIOGRAPHY (N/A) as a surgical intervention.  The patient's history has been reviewed, patient examined, no change in status, stable for surgery.  I have reviewed the patient's chart and labs.  Questions were answered to the patient's satisfaction.   Cath Lab Visit (complete for each Cath Lab visit)  Clinical Evaluation Leading to the Procedure:   ACS: Yes.    Non-ACS:    Anginal Classification: CCS III  Anti-ischemic medical therapy: Maximal Therapy (2 or more classes of medications)  Non-Invasive Test Results: No non-invasive testing performed  Prior CABG: Previous CABG        [facility] 08/25/2023 1:48 PM

## 2023-08-25 NOTE — Progress Notes (Signed)
Patient back from procedure.

## 2023-08-25 NOTE — Progress Notes (Signed)
 Patient had episodes on non-sustained sinus bradycardia and sinus pauses - longest pause at 2.22 sec. Went to check on pt and she is sleeping during these episodes. EKG completed. Dr. Almetta notified; states is okay to give PM metoprolol  and continue to monitor on tele.

## 2023-08-25 NOTE — Progress Notes (Signed)
 PHARMACY - ANTICOAGULATION CONSULT NOTE  Pharmacy Consult for heparin  Indication: chest pain/ACS  No Known Allergies  Patient Measurements: Height: 5' 1 (154.9 cm) Weight: 69.4 kg (153 lb) IBW/kg (Calculated) : 47.8 HEPARIN  DW (KG): 62.6  Vital Signs: Temp: 97.7 F (36.5 C) (08/13 0618) Temp Source: Oral (08/13 0618) BP: 142/66 (08/13 1100) Pulse Rate: 58 (08/13 1100)  Labs: Recent Labs    08/24/23 1906 08/24/23 2327 08/25/23 0511 08/25/23 1031  HGB 12.2  --  11.9*  --   HCT 40.2  --  38.3  --   PLT 359  --  335  --   HEPARINUNFRC  --   --   --  0.34  CREATININE 1.38*  --  1.40*  --   TROPONINIHS 36* 31*  --   --     Estimated Creatinine Clearance: 31.9 mL/min (A) (by C-G formula based on SCr of 1.4 mg/dL (H)).  Assessment: 39 yoF presented to ED with chest pain. PMH includes CAD s/p CABG in 05/2011, TCAR in 04/2023, HLD, T2DM, CKD stage 3, hypothyroidism. Pharmacy consulted to dose heparin  for ACS. Heparin  level is therapeutic at 0.34. No bleeding noted. Planning cardiac cath.   Goal of Therapy:  Heparin  level 0.3-0.7 units/ml Monitor platelets by anticoagulation protocol: Yes   Plan:  Continue heparin  gtt 750 units/hr F/u post-cath  Vernell Meier, PharmD, BCPS, BCEMP Clinical Pharmacist Please see AMION for all pharmacy numbers 08/25/2023 11:50 AM

## 2023-08-26 ENCOUNTER — Inpatient Hospital Stay (HOSPITAL_COMMUNITY)

## 2023-08-26 ENCOUNTER — Inpatient Hospital Stay (HOSPITAL_COMMUNITY)
Admission: EM | Disposition: A | Discharge status: Home / Self Care | Payer: Self-pay | Source: Home / Self Care | Attending: Family Medicine

## 2023-08-26 ENCOUNTER — Encounter (HOSPITAL_COMMUNITY): Payer: Self-pay | Admitting: Cardiology

## 2023-08-26 DIAGNOSIS — R079 Chest pain, unspecified: Secondary | ICD-10-CM

## 2023-08-26 DIAGNOSIS — I200 Unstable angina: Secondary | ICD-10-CM | POA: Diagnosis not present

## 2023-08-26 HISTORY — PX: CORONARY LITHOTRIPSY: CATH118330

## 2023-08-26 HISTORY — PX: CORONARY STENT INTERVENTION: CATH118234

## 2023-08-26 HISTORY — PX: CORONARY ULTRASOUND/IVUS: CATH118244

## 2023-08-26 LAB — ECHOCARDIOGRAM COMPLETE
AR max vel: 1.63 cm2
AV Area VTI: 1.72 cm2
AV Area mean vel: 1.82 cm2
AV Mean grad: 5 mmHg
AV Peak grad: 8.8 mmHg
Ao pk vel: 1.48 m/s
Area-P 1/2: 3.51 cm2
Height: 61 in
S' Lateral: 3.8 cm
Weight: 2502.4 [oz_av]

## 2023-08-26 LAB — CBC
HCT: 36.2 % (ref 36.0–46.0)
Hemoglobin: 11.2 g/dL — ABNORMAL LOW (ref 12.0–15.0)
MCH: 28.1 pg (ref 26.0–34.0)
MCHC: 30.9 g/dL (ref 30.0–36.0)
MCV: 90.7 fL (ref 80.0–100.0)
Platelets: 310 K/uL (ref 150–400)
RBC: 3.99 MIL/uL (ref 3.87–5.11)
RDW: 12.8 % (ref 11.5–15.5)
WBC: 7.2 K/uL (ref 4.0–10.5)
nRBC: 0 % (ref 0.0–0.2)

## 2023-08-26 LAB — PROTIME-INR
INR: 1 (ref 0.8–1.2)
Prothrombin Time: 13.8 s (ref 11.4–15.2)

## 2023-08-26 LAB — GLUCOSE, CAPILLARY
Glucose-Capillary: 116 mg/dL — ABNORMAL HIGH (ref 70–99)
Glucose-Capillary: 117 mg/dL — ABNORMAL HIGH (ref 70–99)
Glucose-Capillary: 141 mg/dL — ABNORMAL HIGH (ref 70–99)
Glucose-Capillary: 159 mg/dL — ABNORMAL HIGH (ref 70–99)
Glucose-Capillary: 208 mg/dL — ABNORMAL HIGH (ref 70–99)
Glucose-Capillary: 95 mg/dL (ref 70–99)

## 2023-08-26 LAB — BASIC METABOLIC PANEL WITH GFR
Anion gap: 10 (ref 5–15)
BUN: 21 mg/dL (ref 8–23)
CO2: 22 mmol/L (ref 22–32)
Calcium: 9.9 mg/dL (ref 8.9–10.3)
Chloride: 109 mmol/L (ref 98–111)
Creatinine, Ser: 1.09 mg/dL — ABNORMAL HIGH (ref 0.44–1.00)
GFR, Estimated: 54 mL/min — ABNORMAL LOW (ref >60)
Glucose, Bld: 115 mg/dL — ABNORMAL HIGH (ref 70–99)
Potassium: 4.3 mmol/L (ref 3.5–5.1)
Sodium: 141 mmol/L (ref 135–145)

## 2023-08-26 LAB — MAGNESIUM: Magnesium: 1.7 mg/dL (ref 1.7–2.4)

## 2023-08-26 LAB — POCT ACTIVATED CLOTTING TIME: Activated Clotting Time: 446 s

## 2023-08-26 LAB — HEPARIN LEVEL (UNFRACTIONATED): Heparin Unfractionated: 0.2 [IU]/mL — ABNORMAL LOW (ref 0.30–0.70)

## 2023-08-26 MED ORDER — HEPARIN SODIUM (PORCINE) 1000 UNIT/ML IJ SOLN
INTRAMUSCULAR | Status: AC
  Filled 2023-08-26: qty 10

## 2023-08-26 MED ORDER — ROSUVASTATIN CALCIUM 20 MG PO TABS
40.0000 mg | ORAL_TABLET | Freq: Every day | ORAL | Status: DC
  Administered 2023-08-26 – 2023-08-27 (×2): 40 mg via ORAL
  Filled 2023-08-26 (×2): qty 2

## 2023-08-26 MED ORDER — FENTANYL CITRATE (PF) 100 MCG/2ML IJ SOLN
INTRAMUSCULAR | Status: DC | PRN
  Administered 2023-08-26: 25 ug via INTRAVENOUS

## 2023-08-26 MED ORDER — VERAPAMIL HCL 2.5 MG/ML IV SOLN
INTRAVENOUS | Status: DC | PRN
  Administered 2023-08-26: 10 mL via INTRA_ARTERIAL

## 2023-08-26 MED ORDER — HEPARIN (PORCINE) IN NACL 1000-0.9 UT/500ML-% IV SOLN
INTRAVENOUS | Status: DC | PRN
Start: 2023-08-26 — End: 2023-08-26
  Administered 2023-08-26 (×2): 500 mL

## 2023-08-26 MED ORDER — LIDOCAINE HCL (PF) 1 % IJ SOLN
INTRAMUSCULAR | Status: AC
Start: 2023-08-26 — End: 2023-08-26
  Filled 2023-08-26: qty 30

## 2023-08-26 MED ORDER — VERAPAMIL HCL 2.5 MG/ML IV SOLN
INTRAVENOUS | Status: AC
  Filled 2023-08-26: qty 2

## 2023-08-26 MED ORDER — HEPARIN SODIUM (PORCINE) 1000 UNIT/ML IJ SOLN
INTRAMUSCULAR | Status: DC | PRN
  Administered 2023-08-26: 7000 [IU] via INTRAVENOUS

## 2023-08-26 MED ORDER — FREE WATER: 500.0000 mL | Freq: Once | Status: DC

## 2023-08-26 MED ORDER — SODIUM CHLORIDE 0.9 % IV SOLN: INTRAVENOUS | Status: DC

## 2023-08-26 MED ORDER — LIDOCAINE HCL (PF) 1 % IJ SOLN
INTRAMUSCULAR | Status: DC | PRN
  Administered 2023-08-26: 2 mL via INTRADERMAL

## 2023-08-26 MED ORDER — SODIUM CHLORIDE 0.9% FLUSH: 3.0000 mL | INTRAVENOUS | Status: DC | PRN

## 2023-08-26 MED ORDER — LABETALOL HCL 5 MG/ML IV SOLN: 10.0000 mg | INTRAVENOUS | Status: AC | PRN

## 2023-08-26 MED ORDER — MIDAZOLAM HCL 2 MG/2ML IJ SOLN
INTRAMUSCULAR | Status: AC
  Filled 2023-08-26: qty 2

## 2023-08-26 MED ORDER — NITROGLYCERIN 1 MG/10 ML FOR IR/CATH LAB
INTRA_ARTERIAL | Status: AC
  Filled 2023-08-26: qty 10

## 2023-08-26 MED ORDER — FENTANYL CITRATE (PF) 100 MCG/2ML IJ SOLN
INTRAMUSCULAR | Status: AC
Start: 2023-08-26 — End: 2023-08-26
  Filled 2023-08-26: qty 2

## 2023-08-26 MED ORDER — IOHEXOL 350 MG/ML SOLN
INTRAVENOUS | Status: DC | PRN
  Administered 2023-08-26: 75 mL

## 2023-08-26 MED ORDER — SODIUM CHLORIDE 0.9 % IV SOLN: 250.0000 mL | INTRAVENOUS | Status: AC | PRN

## 2023-08-26 MED ORDER — PERFLUTREN LIPID MICROSPHERE
1.0000 mL | INTRAVENOUS | Status: AC | PRN
  Administered 2023-08-26: 2 mL via INTRAVENOUS

## 2023-08-26 MED ORDER — ENOXAPARIN SODIUM 40 MG/0.4ML IJ SOSY
40.0000 mg | PREFILLED_SYRINGE | INTRAMUSCULAR | Status: DC
  Filled 2023-08-26: qty 0.4

## 2023-08-26 MED ORDER — MIDAZOLAM HCL 2 MG/2ML IJ SOLN
INTRAMUSCULAR | Status: DC | PRN
  Administered 2023-08-26: 2 mg via INTRAVENOUS

## 2023-08-26 MED ORDER — SODIUM CHLORIDE 0.9 % IV SOLN
INTRAVENOUS | Status: AC | PRN
  Administered 2023-08-26: 300 mL via INTRAVENOUS

## 2023-08-26 MED ORDER — SODIUM CHLORIDE 0.9% FLUSH
3.0000 mL | Freq: Two times a day (BID) | INTRAVENOUS | Status: DC
  Administered 2023-08-26 – 2023-08-27 (×2): 3 mL via INTRAVENOUS

## 2023-08-26 MED ORDER — HYDRALAZINE HCL 20 MG/ML IJ SOLN: 10.0000 mg | INTRAMUSCULAR | Status: AC | PRN

## 2023-08-26 NOTE — Hospital Course (Addendum)
 Tasha Garrett is a 73 y.o. female with a history of hypertension, hyperlipidemia, diabetes mellitus, hypothyroidism, CAS s/p left TCAR, CAD s/p CABG.  Patient presented secondary to chest discomfort with presentation consistent with unstable angina. Cardiology consulted and performed successful cardiac catheterization with PCI and DES placement to distal left main coronary artery. DAPT started.

## 2023-08-26 NOTE — Progress Notes (Signed)
 Progress Note  Patient Name: Tasha Garrett Date of Encounter: 08/26/2023  Primary Cardiologist:   Lynwood Schilling, MD   Subjective   No pain.  No SOB.   Inpatient Medications    Scheduled Meds:  amLODipine   10 mg Oral Daily   aspirin  EC  81 mg Oral q AM   atorvastatin   80 mg Oral QHS   brimonidine   1 drop Both Eyes BID   clopidogrel   75 mg Oral Daily   insulin  aspart  0-6 Units Subcutaneous Q4H   isosorbide  mononitrate  30 mg Oral Daily   latanoprost   1 drop Both Eyes QHS   levothyroxine   137 mcg Oral QAC breakfast   metoprolol  tartrate  25 mg Oral BID   sodium chloride  flush  3 mL Intravenous Q12H   sodium chloride  flush  3 mL Intravenous Q12H   Continuous Infusions:  sodium chloride      sodium chloride      heparin  900 Units/hr (08/26/23 0449)   PRN Meds: sodium chloride , acetaminophen  **OR** acetaminophen , morphine  injection, nitroGLYCERIN , ondansetron  **OR** ondansetron  (ZOFRAN ) IV, oxyCODONE , senna-docusate, sodium chloride  flush   Vital Signs    Vitals:   08/26/23 0014 08/26/23 0408 08/26/23 0746 08/26/23 0854  BP: (!) 134/114 (!) 144/56 (!) 149/62   Pulse: (!) 59 (!) 51 60   Resp: 20 20 16    Temp: 97.7 F (36.5 C) 98.3 F (36.8 C) 98.1 F (36.7 C)   TempSrc: Oral Oral Oral   SpO2: 93% 91% 97%   Weight:    70.9 kg  Height:        Intake/Output Summary (Last 24 hours) at 08/26/2023 0902 Last data filed at 08/26/2023 0400 Gross per 24 hour  Intake 869.53 ml  Output --  Net 869.53 ml   Filed Weights   08/24/23 1855 08/26/23 0854  Weight: 69.4 kg 70.9 kg    Telemetry     - Personally Reviewed  ECG    Sinus with PACs with probable blocked PACs in a bigeminal pattern with junctional escape beats.  - Personally Reviewed  Physical Exam   GEN: No acute distress.   Neck: No  JVD Cardiac: RRR, no murmurs, rubs, or gallops.  Respiratory: Clear  to auscultation bilaterally. GI: Soft, nontender, non-distended  MS: No  edema; No deformity. Neuro:   Nonfocal  Psych: Normal affect   Labs    Chemistry Recent Labs  Lab 08/24/23 1906 08/25/23 0511 08/26/23 0314  NA 137 140 141  K 4.9 3.9 4.3  CL 105 107 109  CO2 22 23 22   GLUCOSE 212* 240* 115*  BUN 32* 29* 21  CREATININE 1.38* 1.40* 1.09*  CALCIUM  10.4* 10.2 9.9  GFRNONAA 40* 40* 54*  ANIONGAP 10 10 10      Hematology Recent Labs  Lab 08/24/23 1906 08/25/23 0511 08/26/23 0314  WBC 9.3 9.6 7.2  RBC 4.36 4.20 3.99  HGB 12.2 11.9* 11.2*  HCT 40.2 38.3 36.2  MCV 92.2 91.2 90.7  MCH 28.0 28.3 28.1  MCHC 30.3 31.1 30.9  RDW 12.8 12.8 12.8  PLT 359 335 310    Cardiac EnzymesNo results for input(s): TROPONINI in the last 168 hours. No results for input(s): TROPIPOC in the last 168 hours.   BNPNo results for input(s): BNP, PROBNP in the last 168 hours.   DDimer No results for input(s): DDIMER in the last 168 hours.   Radiology    CARDIAC CATHETERIZATION Result Date: 08/25/2023   Prox RCA-1 lesion is 95% stenosed.  Prox RCA-2 lesion is 95% stenosed.   Prox LAD to Mid LAD lesion is 100% stenosed.   1st Mrg lesion is 90% stenosed.   Mid LM to Dist LM lesion is 90% stenosed.   LIMA graft was visualized by angiography and is normal in caliber.   SVG graft was visualized by angiography and is normal in caliber.   SVG graft was visualized by angiography and is normal in caliber.   The graft exhibits no disease.   The graft exhibits no disease.   The graft exhibits no disease.   LV end diastolic pressure is normal. Severe 3 vessel and distal left main disease Patent LIMA to the LAD Patent SVG to OM1 Patent SVG to RCA Normal LVEDP 8 mm Hg Plan: will hydrate and repeat BMET in am. Check LV function by Echo. The left main stenosis supplies the distal LCx which is a co dominant vessel and is the likely source of her chest pain. Would consider PCI of the distal left main with Shockwave lithotripsy.   DG Chest 2 View Result Date: 08/24/2023 CLINICAL DATA:  Chest pain EXAM:  CHEST - 2 VIEW COMPARISON:  Chest x-ray 02/26/2021 FINDINGS: The heart is enlarged, unchanged. Patient is status post cardiac surgery. Lungs are clear. There is no pleural effusion or pneumothorax. No acute fractures are seen. A IMPRESSION: 1. No active cardiopulmonary disease. 2. Cardiomegaly. Electronically Signed   By: Greig Pique M.D.   On: 08/24/2023 20:11    Cardiac Studies   Echo :  Pending  Cath:  See above.   Patient Profile     73 y.o. female with a hx of CAD s/p CABG in 05/2011 (LIMA-mid LAD, SVG-OM, SVG-RCA), HLD, T2DM, CKD stage 3, hypothyroidism, carotid artery disease s/p left TCAR in 04/2023 who is being seen 08/25/2023 for the evaluation of chest pain.    Assessment & Plan    CAD:   Bypass grafts patent.   Staged procedure with elevated creat yesterday.  I have reviewed the films and discussed this with Dr. [place].  Status post stent to LM.  Continue with risk reduction.    DM:    A1c 6.6.    Continue current therapy.  HTN:  BP is elevated .  Continue current meds.  She might need spiro if BP remains high at home.  She can keep a BP diary.   Dyslipidemia:  LDL is 64.  I would like the LDL to 55.  Will change to Crestor  40 mg PO daily.  LPa pending.   CKD IIIa:  Creat is improved today. .    Bradycardia:  No symptoms.  No change in therapy.   For questions or updates, please contact [facility] Please consult [email or web address] for contact info under Cardiology/STEMI.   Signed, Lynwood Schilling, MD  08/26/2023, 9:02 AM

## 2023-08-26 NOTE — Progress Notes (Signed)
 PROGRESS NOTE    Tasha Garrett  FMW:[id number] DOB: December 01, 1950 DOA: 08/24/2023 PCP: Theophilus Andrews, Tully GRADE, MD   Brief Narrative: Tasha Garrett is a 73 y.o. female with a history of hypertension, hyperlipidemia, diabetes mellitus, hypothyroidism, CAS s/p left TCAR, CAD s/p CABG.  Patient presented secondary to chest discomfort with presentation consistent with unstable angina. Cardiology consulted and performed successful cardiac catheterization with PCI and DES placement to distal left main coronary artery. DAPT started.   Assessment and Plan:  Unstable angina Cardiology consulted and performed a cardiac catheterization on 8/14. Mid LM to distal LM lesion was 90% stenosed and patient undewent successful DES placement with 0% residual stenosis. Recommendations for uninterrupted dual antiplatelet therapy with aspirin  81 mg daily and clopidogrel  75 mg daily for a minimum of 12 months. -Continue aspirin  and Plavix    CKD stage IIIa Stable. Losartan  held on admission with improvement of creatinine.  Diabetes mellitus type 2 Controlled based on hemoglobin A1C of 6.6% from July 2025. -Continue SSI  Primary hypertension -Continue amlodipine  and metoprolol   Hypothyroidism -Continue Synthroid    DVT prophylaxis: Heparin  IV Code Status:   Code Status: Full Code Family Communication: None at bedside Disposition Plan: Discharge home likely in AM pending continued cardiology recommendations   Consultants:  Cardiology  Procedures:  Cardiac catheterization with PCI  Antimicrobials: None    Subjective: Patient reports resolved chest pain. No other issues overnight.  Objective: BP (!) 166/89   Pulse 60   Temp 98.1 F (36.7 C) (Oral)   Resp 16   Ht 5' 1 (1.549 m)   Wt 70.9 kg   SpO2 97%   BMI 29.55 kg/m   Examination:  General exam: Appears calm and comfortable Respiratory system: Clear to auscultation. Respiratory effort normal. Cardiovascular system: S1 & S2 heard,  RRR. No murmurs Gastrointestinal system: Abdomen is nondistended, soft and nontender. Normal bowel sounds heard. Central nervous system: Alert and oriented. No focal neurological deficits. Musculoskeletal: No edema. No calf tenderness Skin: No cyanosis. No rashes Psychiatry: Judgement and insight appear normal. Mood & affect appropriate.    Data Reviewed: I have personally reviewed following labs and imaging studies  CBC Lab Results  Component Value Date   WBC 7.2 08/26/2023   RBC 3.99 08/26/2023   HGB 11.2 (L) 08/26/2023   HCT 36.2 08/26/2023   MCV 90.7 08/26/2023   MCH 28.1 08/26/2023   PLT 310 08/26/2023   MCHC 30.9 08/26/2023   RDW 12.8 08/26/2023   LYMPHSABS 1.8 05/20/2022   MONOABS 0.7 05/20/2022   EOSABS 0.5 05/20/2022   BASOSABS 0.1 05/20/2022     Last metabolic panel Lab Results  Component Value Date   NA 141 08/26/2023   K 4.3 08/26/2023   CL 109 08/26/2023   CO2 22 08/26/2023   BUN 21 08/26/2023   CREATININE 1.09 (H) 08/26/2023   GLUCOSE 115 (H) 08/26/2023   GFRNONAA 54 (L) 08/26/2023   CALCIUM  9.9 08/26/2023   PROT 7.3 04/27/2023   ALBUMIN 3.9 04/27/2023   BILITOT 0.6 04/27/2023   ALKPHOS 103 04/27/2023   AST 21 04/27/2023   ALT 17 04/27/2023   ANIONGAP 10 08/26/2023    GFR: Estimated Creatinine Clearance: 41.4 mL/min (A) (by C-G formula based on SCr of 1.09 mg/dL (H)).  No results found for this or any previous visit (from the past 240 hours).    Radiology Studies: CARDIAC CATHETERIZATION Result Date: 08/25/2023   Prox RCA-1 lesion is 95% stenosed.   Prox RCA-2 lesion is 95% stenosed.  Prox LAD to Mid LAD lesion is 100% stenosed.   1st Mrg lesion is 90% stenosed.   Mid LM to Dist LM lesion is 90% stenosed.   LIMA graft was visualized by angiography and is normal in caliber.   SVG graft was visualized by angiography and is normal in caliber.   SVG graft was visualized by angiography and is normal in caliber.   The graft exhibits no disease.   The  graft exhibits no disease.   The graft exhibits no disease.   LV end diastolic pressure is normal. Severe 3 vessel and distal left main disease Patent LIMA to the LAD Patent SVG to OM1 Patent SVG to RCA Normal LVEDP 8 mm Hg Plan: will hydrate and repeat BMET in am. Check LV function by Echo. The left main stenosis supplies the distal LCx which is a co dominant vessel and is the likely source of her chest pain. Would consider PCI of the distal left main with Shockwave lithotripsy.   DG Chest 2 View Result Date: 08/24/2023 CLINICAL DATA:  Chest pain EXAM: CHEST - 2 VIEW COMPARISON:  Chest x-ray 02/26/2021 FINDINGS: The heart is enlarged, unchanged. Patient is status post cardiac surgery. Lungs are clear. There is no pleural effusion or pneumothorax. No acute fractures are seen. A IMPRESSION: 1. No active cardiopulmonary disease. 2. Cardiomegaly. Electronically Signed   By: Greig Pique M.D.   On: 08/24/2023 20:11      LOS: 1 day    Elgin Lam, MD [facility] 08/26/2023, 9:47 AM   If 7PM-7AM, please contact night-coverage [email or web address]

## 2023-08-26 NOTE — Progress Notes (Signed)
 PHARMACY - ANTICOAGULATION CONSULT NOTE  Pharmacy Consult for heparin  Indication: chest pain/ACS  No Known Allergies  Patient Measurements: Height: 5' 1 (154.9 cm) Weight: 69.4 kg (153 lb) IBW/kg (Calculated) : 47.8 HEPARIN  DW (KG): 62.6  Vital Signs: Temp: 98.3 F (36.8 C) (08/14 0408) Temp Source: Oral (08/14 0408) BP: 144/56 (08/14 0408) Pulse Rate: 51 (08/14 0408)  Labs: Recent Labs    08/24/23 1906 08/24/23 2327 08/25/23 0511 08/25/23 1031 08/26/23 0314  HGB 12.2  --  11.9*  --  11.2*  HCT 40.2  --  38.3  --  36.2  PLT 359  --  335  --  310  LABPROT  --   --   --   --  13.8  INR  --   --   --   --  1.0  HEPARINUNFRC  --   --   --  0.34 0.20*  CREATININE 1.38*  --  1.40*  --  1.09*  TROPONINIHS 36* 31*  --   --   --     Estimated Creatinine Clearance: 40.9 mL/min (A) (by C-G formula based on SCr of 1.09 mg/dL (H)).  Assessment: 82 yoF presented to ED with chest pain. PMH includes CAD s/p CABG in 05/2011, TCAR in 04/2023, HLD, T2DM, CKD stage 3, hypothyroidism. Pharmacy consulted to dose heparin  for ACS. Heparin  level is therapeutic at 0.34. No bleeding noted. Planning cardiac cath.   AM: heparin  level subtherapeutic on 750 units/hr (~7h post restart). Per RN, no issues running continuously or signs/symptoms of bleeding. CBC stable  Goal of Therapy:  Heparin  level 0.3-0.7 units/ml Monitor platelets by anticoagulation protocol: Yes   Plan:  Increase heparin  gtt 900 units/hr Monitor for signs of bleeding F/up [facility] after procedure scheduled for 10:30 on 8/14   Lynwood Poplar, PharmD, BCPS Clinical Pharmacist 08/26/2023 4:47 AM

## 2023-08-26 NOTE — Interval H&P Note (Signed)
 History and Physical Interval Note:  08/26/2023 10:16 AM  Tasha Garrett  has presented today for surgery, with the diagnosis of cad.  The various methods of treatment have been discussed with the patient and family. After consideration of risks, benefits and other options for treatment, the patient has consented to  Procedure(s): CORONARY STENT INTERVENTION (N/A) as a surgical intervention.  The patient's history has been reviewed, patient examined, no change in status, stable for surgery.  I have reviewed the patient's chart and labs.  Questions were answered to the patient's satisfaction.   Cath Lab Visit (complete for each Cath Lab visit)  Clinical Evaluation Leading to the Procedure:   ACS: Yes.    Non-ACS:    Anginal Classification: CCS III  Anti-ischemic medical therapy: Maximal Therapy (2 or more classes of medications)  Non-Invasive Test Results: No non-invasive testing performed  Prior CABG: Previous CABG        [facility] 08/26/2023 10:16 AM

## 2023-08-26 NOTE — Discharge Summary (Signed)
 Physician Discharge Summary   Patient: Tasha Garrett MRN: [id number] DOB: [birth year]  Admit date:     08/24/2023  Discharge date: 08/27/23  Discharge Physician: Elgin Lam, MD   PCP: Theophilus Andrews, Tully GRADE, MD   Recommendations at discharge:  PCP visit for hospital follow-up Cardiology visit for hospital follow-up  Discharge Diagnoses: Principal Problem:   Unstable angina [facility]) Active Problems:   Chronic kidney disease, stage 3 (HCC)   Essential hypertension   Hypothyroid   DM (diabetes mellitus), type 2 (HCC)  Resolved Problems:   * No resolved hospital problems. *  Hospital Course: Tasha Garrett is a 73 y.o. female with a history of hypertension, hyperlipidemia, diabetes mellitus, hypothyroidism, CAS s/p left TCAR, CAD s/p CABG.  Patient presented secondary to chest discomfort with presentation consistent with unstable angina. Cardiology consulted and performed successful cardiac catheterization with PCI and DES placement to distal left main coronary artery. DAPT started.  Assessment and Plan:  Unstable angina Cardiology consulted and performed a cardiac catheterization on 8/14. Mid LM to distal LM lesion was 90% stenosed and patient undewent successful DES placement with 0% residual stenosis. Recommendations for uninterrupted dual antiplatelet therapy with aspirin  81 mg daily and clopidogrel  75 mg daily for a minimum of 12 months.   CKD stage IIIa Stable. Losartan  held on admission with improvement of creatinine. Creatinine of 1.28 on discharge.   Diabetes mellitus type 2 Controlled based on hemoglobin A1C of 6.6% from July 2025. Continue diet control and consider medication management.   Primary hypertension Continue amlodipine  and metoprolol . Resume home losartan  on discharge.   Hypothyroidism Continue Synthroid .  Hyperlipidemia Lipitor  changed to Crestor  on discharge.   Consultants: Cardiology Procedures performed: Cardiac catheterization with  PCI Disposition: Home Diet recommendation: Cardiac and Carb modified diet   DISCHARGE MEDICATION: Allergies as of 08/27/2023   No Known Allergies      Medication List     STOP taking these medications    atorvastatin  80 MG tablet Commonly known as: LIPITOR        TAKE these medications    amLODipine  10 MG tablet Commonly known as: NORVASC  TAKE 1 TABLET EVERY DAY   aspirin  EC 81 MG tablet Take 81 mg by mouth in the morning.   brimonidine  0.2 % ophthalmic solution Commonly known as: ALPHAGAN  Place 1 drop into both eyes in the morning and at bedtime.   clopidogrel  75 MG tablet Commonly known as: PLAVIX  Take 1 tablet (75 mg total) by mouth daily.   Cod Liver Oil Caps Take 1 capsule by mouth in the morning.   isosorbide  mononitrate 30 MG 24 hr tablet Commonly known as: IMDUR  TAKE 1 TABLET EVERY DAY   latanoprost  0.005 % ophthalmic solution Commonly known as: XALATAN  Place 1 drop into both eyes at bedtime.   levothyroxine  137 MCG tablet Commonly known as: SYNTHROID  Take 1 tablet (137 mcg total) by mouth daily.   losartan  100 MG tablet Commonly known as: COZAAR  TAKE 1 TABLET (100 MG TOTAL) BY MOUTH DAILY.   metoprolol  tartrate 25 MG tablet Commonly known as: LOPRESSOR  Take 1 tablet (25 mg total) by mouth 2 (two) times daily.   multivitamin with minerals Tabs tablet Take 1 tablet by mouth in the morning.   rosuvastatin  40 MG tablet Commonly known as: CRESTOR  Take 1 tablet (40 mg total) by mouth daily. Start taking on: August 28, 2023   Systane Hydration PF 0.4-0.3 % Soln Generic drug: Polyethyl Glyc-Propyl Glyc PF Place 1 drop into both eyes daily as needed (  dry, gritty eyes).        Follow-up Information     Theophilus Andrews, Tully GRADE, MD. Schedule an appointment as soon as possible for a visit in 1 week(s).   Specialty: Internal Medicine Why: For hospital follow-up Contact information: [address] [place] [facility] [place]  [address] [phone]         Rosalba Lillia BIRCH, MD Follow up in 2 week(s).   Specialty: Cardiology Why: For hospital follow-up Contact information: [address] [place] [address] [phone]                Discharge Exam: BP 104/73 (BP Location: Left Arm)   Pulse 63   Temp 98.1 F (36.7 C) (Oral)   Resp 18   Ht 5' 1 (1.549 m)   Wt 70.9 kg   SpO2 98%   BMI 29.55 kg/m   General exam: Appears calm and comfortable Respiratory system: Clear to auscultation. Respiratory effort normal. Cardiovascular system: S1 & S2 heard, RRR. No murmurs, rubs, gallops or clicks. Gastrointestinal system: Abdomen is nondistended, soft and nontender. Normal bowel sounds heard. Central nervous system: Alert and oriented. No focal neurological deficits. Psychiatry: Judgement and insight appear normal. Mood & affect appropriate.   Condition at discharge: stable  The results of significant diagnostics from this hospitalization (including imaging, microbiology, ancillary and laboratory) are listed below for reference.   Imaging Studies: ECHOCARDIOGRAM COMPLETE Result Date: 08/26/2023    ECHOCARDIOGRAM REPORT   Patient Name:   Tasha Garrett Date of Exam: 08/26/2023 Medical Rec #:  [id number]   Height:       61.0 in Accession #:    [id number]  Weight:       153.0 lb Date of Birth:  [birth year]   BSA:          1.686 m Patient Age:    73 years    BP:           149/62 mmHg Patient Gender: F           HR:           62 bpm. Exam Location:  Inpatient Procedure: 2D Echo, Cardiac Doppler, Color Doppler and Intracardiac            Opacification Agent (Both Spectral and Color Flow Doppler were            utilized during procedure). Indications:    Chest Pain  History:        Patient has no prior history of Echocardiogram examinations. CAD                 and Previous Myocardial Infarction, Prior CABG; Risk                 Factors:Diabetes and Hypertension.  Sonographer:    Jayson Gaskins Referring  Phys: HUSAM M SALAH IMPRESSIONS  1. Left ventricular ejection fraction, by estimation, is 50 to 55%. The left ventricle has low normal function. The left ventricle demonstrates regional wall motion abnormalities (see scoring diagram/findings for description). Left ventricular diastolic  parameters are consistent with Grade II diastolic dysfunction (pseudonormalization).  2. Right ventricular systolic function is normal. The right ventricular size is normal. Tricuspid regurgitation signal is inadequate for assessing PA pressure.  3. The mitral valve is degenerative. Trivial mitral valve regurgitation. No evidence of mitral stenosis.  4. The aortic valve is tricuspid. Aortic valve regurgitation is not visualized. Aortic valve sclerosis is present, with no evidence of aortic valve stenosis.  5. The inferior vena cava is normal in size with greater than 50% respiratory variability, suggesting right atrial pressure of 3 mmHg. Conclusion(s)/Recommendation(s): No left ventricular mural or apical thrombus/thrombi. FINDINGS  Left Ventricle: Left ventricular ejection fraction, by estimation, is 50 to 55%. The left ventricle has low normal function. The left ventricle demonstrates regional wall motion abnormalities. Definity  contrast agent was given IV to delineate the left ventricular endocardial borders. The left ventricular internal cavity size was normal in size. There is no left ventricular hypertrophy. Abnormal (paradoxical) septal motion consistent with post-operative status. Left ventricular diastolic parameters are  consistent with Grade II diastolic dysfunction (pseudonormalization).  LV Wall Scoring: The apical septal segment and apex are akinetic. Right Ventricle: The right ventricular size is normal. No increase in right ventricular wall thickness. Right ventricular systolic function is normal. Tricuspid regurgitation signal is inadequate for assessing PA pressure. Left Atrium: Left atrial size was normal in size.  Right Atrium: Right atrial size was normal in size. Pericardium: There is no evidence of pericardial effusion. Presence of epicardial fat layer. Mitral Valve: The mitral valve is degenerative in appearance. There is mild calcification of the mitral valve leaflet(s). Mild to moderate mitral annular calcification. Trivial mitral valve regurgitation. No evidence of mitral valve stenosis. Tricuspid Valve: The tricuspid valve is grossly normal. Tricuspid valve regurgitation is not demonstrated. Aortic Valve: The aortic valve is tricuspid. Aortic valve regurgitation is not visualized. Aortic valve sclerosis is present, with no evidence of aortic valve stenosis. Aortic valve mean gradient measures 5.0 mmHg. Aortic valve peak gradient measures 8.8  mmHg. Aortic valve area, by VTI measures 1.72 cm. Pulmonic Valve: The pulmonic valve was grossly normal. Pulmonic valve regurgitation is trivial. No evidence of pulmonic stenosis. Aorta: The aortic root is normal in size and structure. Venous: The inferior vena cava is normal in size with greater than 50% respiratory variability, suggesting right atrial pressure of 3 mmHg. IAS/Shunts: The atrial septum is grossly normal.  LEFT VENTRICLE PLAX 2D LVIDd:         5.20 cm   Diastology LVIDs:         3.80 cm   LV e' medial:    5.77 cm/s LV PW:         0.80 cm   LV E/e' medial:  17.3 LV IVS:        0.70 cm   LV e' lateral:   8.81 cm/s LVOT diam:     1.70 cm   LV E/e' lateral: 11.3 LV SV:         54 LV SV Index:   32 LVOT Area:     2.27 cm  RIGHT VENTRICLE RV S prime:     11.40 cm/s TAPSE (M-mode): 1.8 cm LEFT ATRIUM             Index        RIGHT ATRIUM           Index LA Vol (A2C):   35.5 ml 21.06 ml/m  RA Area:     14.10 cm LA Vol (A4C):   51.8 ml 30.73 ml/m  RA Volume:   31.80 ml  18.87 ml/m LA Biplane Vol: 45.5 ml 26.99 ml/m  AORTIC VALVE AV Area (Vmax):    1.63 cm AV Area (Vmean):   1.82 cm AV Area (VTI):     1.72 cm AV Vmax:           148.00 cm/s AV Vmean:  99.000 cm/s AV VTI:            0.312 m AV Peak Grad:      8.8 mmHg AV Mean Grad:      5.0 mmHg LVOT Vmax:         106.00 cm/s LVOT Vmean:        79.300 cm/s LVOT VTI:          0.236 m LVOT/AV VTI ratio: 0.76  AORTA Ao Root diam: 2.60 cm MITRAL VALVE MV Area (PHT): 3.51 cm    SHUNTS MV Decel Time: 216 msec    Systemic VTI:  0.24 m MV E velocity: 99.60 cm/s  Systemic Diam: 1.70 cm MV A velocity: 96.90 cm/s MV E/A ratio:  1.03 Darryle Decent MD Electronically signed by Darryle Decent MD Signature Date/Time: 08/26/2023/1:12:56 PM    Final    CARDIAC CATHETERIZATION Result Date: 08/26/2023   Mid LM to Dist LM lesion is 90% stenosed.   A drug-eluting stent was successfully placed using a STENT SYNERGY XD 3.50X12.   Post intervention, there is a 0% residual stenosis.   Recommend uninterrupted dual antiplatelet therapy with Aspirin  81mg  daily and Clopidogrel  75mg  daily for a minimum of 12 months (ACS-Class I recommendation). Successful PCI of the distal Left main with IVUS guidance, intracoronary lithotripsy and DES Plan; DAPT for one year. Anticipate DC in am   CARDIAC CATHETERIZATION Result Date: 08/25/2023   Prox RCA-1 lesion is 95% stenosed.   Prox RCA-2 lesion is 95% stenosed.   Prox LAD to Mid LAD lesion is 100% stenosed.   1st Mrg lesion is 90% stenosed.   Mid LM to Dist LM lesion is 90% stenosed.   LIMA graft was visualized by angiography and is normal in caliber.   SVG graft was visualized by angiography and is normal in caliber.   SVG graft was visualized by angiography and is normal in caliber.   The graft exhibits no disease.   The graft exhibits no disease.   The graft exhibits no disease.   LV end diastolic pressure is normal. Severe 3 vessel and distal left main disease Patent LIMA to the LAD Patent SVG to OM1 Patent SVG to RCA Normal LVEDP 8 mm Hg Plan: will hydrate and repeat BMET in am. Check LV function by Echo. The left main stenosis supplies the distal LCx which is a co dominant vessel and is the  likely source of her chest pain. Would consider PCI of the distal left main with Shockwave lithotripsy.   DG Chest 2 View Result Date: 08/24/2023 CLINICAL DATA:  Chest pain EXAM: CHEST - 2 VIEW COMPARISON:  Chest x-ray 02/26/2021 FINDINGS: The heart is enlarged, unchanged. Patient is status post cardiac surgery. Lungs are clear. There is no pleural effusion or pneumothorax. No acute fractures are seen. A IMPRESSION: 1. No active cardiopulmonary disease. 2. Cardiomegaly. Electronically Signed   By: Greig Pique M.D.   On: 08/24/2023 20:11   DG Foot 2 Views Right Result Date: 08/05/2023 Please see detailed radiograph report in office note.   Microbiology: Results for orders placed or performed in visit on 06/02/23  Urine Culture     Status: None   Collection Time: 06/02/23  3:53 PM   Specimen: Urine  Result Value Ref Range Status   MICRO NUMBER: [id number]  Final   SPECIMEN QUALITY: Adequate  Final   Sample Source NOT GIVEN  Final   STATUS: FINAL  Final   Result: No Growth  Final  Labs: CBC: Recent Labs  Lab 08/24/23 1906 08/25/23 0511 08/26/23 0314 08/27/23 0520  WBC 9.3 9.6 7.2 8.6  HGB 12.2 11.9* 11.2* 11.2*  HCT 40.2 38.3 36.2 36.1  MCV 92.2 91.2 90.7 90.9  PLT 359 335 310 312   Basic Metabolic Panel: Recent Labs  Lab 08/24/23 1906 08/25/23 0511 08/26/23 0314 08/27/23 0520  NA 137 140 141 139  K 4.9 3.9 4.3 4.5  CL 105 107 109 107  CO2 22 23 22 23   GLUCOSE 212* 240* 115* 148*  BUN 32* 29* 21 24*  CREATININE 1.38* 1.40* 1.09* 1.28*  CALCIUM  10.4* 10.2 9.9 9.8  MG  --   --  1.7  --    Liver Function Tests: No results for input(s): AST, ALT, ALKPHOS, BILITOT, PROT, ALBUMIN in the last 168 hours. CBG: Recent Labs  Lab 08/26/23 1235 08/26/23 1711 08/26/23 2058 08/27/23 0505 08/27/23 0827  GLUCAP 141* 208* 159* 143* 240*    Discharge time spent: 35 minutes.  Signed: Elgin Lam, MD [facility] 08/27/2023

## 2023-08-26 NOTE — Plan of Care (Signed)
  Problem: Education: Goal: Ability to describe self-care measures that may prevent or decrease complications (Diabetes Survival Skills Education) will improve Outcome: Progressing Goal: Individualized Educational Video(s) Outcome: Progressing   Problem: Coping: Goal: Ability to adjust to condition or change in health will improve Outcome: Progressing   Problem: Fluid Volume: Goal: Ability to maintain a balanced intake and output will improve Outcome: Progressing   Problem: Health Behavior/Discharge Planning: Goal: Ability to identify and utilize available resources and services will improve Outcome: Progressing Goal: Ability to manage health-related needs will improve Outcome: Progressing   Problem: Metabolic: Goal: Ability to maintain appropriate glucose levels will improve Outcome: Progressing   Problem: Nutritional: Goal: Maintenance of adequate nutrition will improve Outcome: Progressing Goal: Progress toward achieving an optimal weight will improve Outcome: Progressing   Problem: Skin Integrity: Goal: Risk for impaired skin integrity will decrease Outcome: Progressing   Problem: Tissue Perfusion: Goal: Adequacy of tissue perfusion will improve Outcome: Progressing   Problem: Education: Goal: Knowledge of General Education information will improve Description: Including pain rating scale, medication(s)/side effects and non-pharmacologic comfort measures Outcome: Progressing   Problem: Health Behavior/Discharge Planning: Goal: Ability to manage health-related needs will improve Outcome: Progressing   Problem: Clinical Measurements: Goal: Ability to maintain clinical measurements within normal limits will improve Outcome: Progressing Goal: Will remain free from infection Outcome: Progressing Goal: Diagnostic test results will improve Outcome: Progressing Goal: Respiratory complications will improve Outcome: Progressing Goal: Cardiovascular complication will  be avoided Outcome: Progressing   Problem: Activity: Goal: Risk for activity intolerance will decrease Outcome: Progressing   Problem: Elimination: Goal: Will not experience complications related to bowel motility Outcome: Progressing Goal: Will not experience complications related to urinary retention Outcome: Progressing   Problem: Pain Managment: Goal: General experience of comfort will improve and/or be controlled Outcome: Progressing   Problem: Safety: Goal: Ability to remain free from injury will improve Outcome: Progressing   Problem: Skin Integrity: Goal: Risk for impaired skin integrity will decrease Outcome: Progressing   Problem: Education: Goal: Understanding of CV disease, CV risk reduction, and recovery process will improve Outcome: Progressing Goal: Individualized Educational Video(s) Outcome: Progressing   Problem: Activity: Goal: Ability to return to baseline activity level will improve Outcome: Progressing   Problem: Cardiovascular: Goal: Ability to achieve and maintain adequate cardiovascular perfusion will improve Outcome: Progressing Goal: Vascular access site(s) Level 0-1 will be maintained Outcome: Progressing   Problem: Health Behavior/Discharge Planning: Goal: Ability to safely manage health-related needs after discharge will improve Outcome: Progressing

## 2023-08-27 LAB — CBC
HCT: 36.1 % (ref 36.0–46.0)
Hemoglobin: 11.2 g/dL — ABNORMAL LOW (ref 12.0–15.0)
MCH: 28.2 pg (ref 26.0–34.0)
MCHC: 31 g/dL (ref 30.0–36.0)
MCV: 90.9 fL (ref 80.0–100.0)
Platelets: 312 K/uL (ref 150–400)
RBC: 3.97 MIL/uL (ref 3.87–5.11)
RDW: 12.6 % (ref 11.5–15.5)
WBC: 8.6 K/uL (ref 4.0–10.5)
nRBC: 0 % (ref 0.0–0.2)

## 2023-08-27 LAB — BASIC METABOLIC PANEL WITH GFR
Anion gap: 9 (ref 5–15)
BUN: 24 mg/dL — ABNORMAL HIGH (ref 8–23)
CO2: 23 mmol/L (ref 22–32)
Calcium: 9.8 mg/dL (ref 8.9–10.3)
Chloride: 107 mmol/L (ref 98–111)
Creatinine, Ser: 1.28 mg/dL — ABNORMAL HIGH (ref 0.44–1.00)
GFR, Estimated: 44 mL/min — ABNORMAL LOW (ref >60)
Glucose, Bld: 148 mg/dL — ABNORMAL HIGH (ref 70–99)
Potassium: 4.5 mmol/L (ref 3.5–5.1)
Sodium: 139 mmol/L (ref 135–145)

## 2023-08-27 LAB — GLUCOSE, CAPILLARY
Glucose-Capillary: 143 mg/dL — ABNORMAL HIGH (ref 70–99)
Glucose-Capillary: 240 mg/dL — ABNORMAL HIGH (ref 70–99)

## 2023-08-27 LAB — LIPOPROTEIN A (LPA): Lipoprotein (a): 29 nmol/L (ref <75.0)

## 2023-08-27 MED ORDER — ROSUVASTATIN CALCIUM 40 MG PO TABS: 40.0000 mg | ORAL_TABLET | Freq: Every day | ORAL | 0 refills | Status: DC

## 2023-08-27 MED ORDER — CLOPIDOGREL BISULFATE 75 MG PO TABS: 75.0000 mg | ORAL_TABLET | Freq: Every day | ORAL | 3 refills | Status: DC

## 2023-08-27 MED FILL — Nitroglycerin IV Soln 100 MCG/ML in D5W: INTRA_ARTERIAL | Qty: 10 | Status: AC

## 2023-08-27 NOTE — Discharge Instructions (Signed)
 Tasha Garrett,  You were in the hospital with chest pain and required a cardiac catheterization with stent placement. You will need to be on aspirin  and Plavix  for at least one year. Please follow-up with your PCP and your cardiologist.

## 2023-08-27 NOTE — Progress Notes (Signed)
 CARDIAC REHAB PHASE I   PRE:  Rate/Rhythm: 64 NSR  BP:  Sitting: 158/71      SpO2: 98 RA  MODE:  Ambulation: 470 ft    POST:  Rate/Rhythm: 996 NSR  BP:  Sitting: 177/85      SpO2: 98 RA  Pt ambulated in the hallway with supervision assit, pt walked well and denies unusual symptoms.   Pt was educated on stent card, stent location, Antiplatelet and ASA use, wt restrictions, no baths/daily wash-ups, s/s of infection, ex guidelines, s/s to stop exercising, NTG use and calling 911, heart healthy diet, risk factors and CRPII. Pt received materials on exercise, diet, and CRPII. Will refer to [facility] in [place].   Tasha FORBES Candy  MS, ACSM-CEP 9:49 AM 08/27/2023    Service time is from 0930 to 0949.

## 2023-08-27 NOTE — Progress Notes (Addendum)
 Rounding Note   Patient Name: Tasha Garrett Date of Encounter: 08/27/2023  [facility] Cardiologist: Dr. Laverda of [facility]  Subjective Denies any chest pain or SOB.   Scheduled Meds:  amLODipine   10 mg Oral Daily   aspirin  EC  81 mg Oral q AM   brimonidine   1 drop Both Eyes BID   clopidogrel   75 mg Oral Daily   enoxaparin  (LOVENOX ) injection  40 mg Subcutaneous Q24H   free water   500 mL Oral Once   insulin  aspart  0-6 Units Subcutaneous Q4H   isosorbide  mononitrate  30 mg Oral Daily   latanoprost   1 drop Both Eyes QHS   levothyroxine   137 mcg Oral QAC breakfast   metoprolol  tartrate  25 mg Oral BID   rosuvastatin   40 mg Oral Daily   sodium chloride  flush  3 mL Intravenous Q12H   sodium chloride  flush  3 mL Intravenous Q12H   sodium chloride  flush  3 mL Intravenous Q12H   Continuous Infusions:  sodium chloride      PRN Meds: sodium chloride , acetaminophen  **OR** acetaminophen , morphine  injection, nitroGLYCERIN , ondansetron  **OR** ondansetron  (ZOFRAN ) IV, oxyCODONE , senna-docusate, sodium chloride  flush, sodium chloride  flush   Vital Signs  Vitals:   08/26/23 1415 08/26/23 1525 08/26/23 2145 08/27/23 0505  BP: 133/64 (!) 156/71 139/67   Pulse: 63 (!) 56 62   Resp: 20 20  20   Temp:  98.4 F (36.9 C)  98.6 F (37 C)  TempSrc:  Oral  Oral  SpO2: 97% 98%    Weight:      Height:       No intake or output data in the 24 hours ending 08/27/23 0921    08/26/2023    8:54 AM 08/24/2023    6:55 PM 07/22/2023   12:50 PM  Last 3 Weights  Weight (lbs) 156 lb 6.4 oz 153 lb 155 lb 14.4 oz  Weight (kg) 70.943 kg 69.4 kg 70.716 kg      Telemetry NSR with single pause around 5AM this morning lasting 2.0 seconds, no other pauses - Personally Reviewed  ECG   NSR without significant ST-T wave changes - Personally Reviewed  Physical Exam  GEN: No acute distress.   Neck: No JVD Cardiac: RRR, no murmurs, rubs, or gallops. Bilateral radial cath sites stable,  no bleeding, no hematoma.  Respiratory: Clear to auscultation bilaterally. GI: Soft, nontender, non-distended  MS: No edema; No deformity. Neuro:  Nonfocal  Psych: Normal affect   Labs High Sensitivity Troponin:   Recent Labs  Lab 08/24/23 1906 08/24/23 2327  TROPONINIHS 36* 31*     Chemistry Recent Labs  Lab 08/25/23 0511 08/26/23 0314 08/27/23 0520  NA 140 141 139  K 3.9 4.3 4.5  CL 107 109 107  CO2 23 22 23   GLUCOSE 240* 115* 148*  BUN 29* 21 24*  CREATININE 1.40* 1.09* 1.28*  CALCIUM  10.2 9.9 9.8  MG  --  1.7  --   GFRNONAA 40* 54* 44*  ANIONGAP 10 10 9     Lipids No results for input(s): CHOL, TRIG, HDL, LABVLDL, LDLCALC, CHOLHDL in the last 168 hours.  Hematology Recent Labs  Lab 08/25/23 0511 08/26/23 0314 08/27/23 0520  WBC 9.6 7.2 8.6  RBC 4.20 3.99 3.97  HGB 11.9* 11.2* 11.2*  HCT 38.3 36.2 36.1  MCV 91.2 90.7 90.9  MCH 28.3 28.1 28.2  MCHC 31.1 30.9 31.0  RDW 12.8 12.8 12.6  PLT 335 310 312   Thyroid No results  for input(s): TSH, FREET4 in the last 168 hours.  BNPNo results for input(s): BNP, PROBNP in the last 168 hours.  DDimer No results for input(s): DDIMER in the last 168 hours.   Radiology  ECHOCARDIOGRAM COMPLETE Result Date: 08/26/2023    ECHOCARDIOGRAM REPORT   Patient Name:   [name] Date of Exam: 08/26/2023 Medical Rec #:  [id number]   Height:       61.0 in Accession #:    [id number]  Weight:       153.0 lb Date of Birth:  1950/11/24   BSA:          1.686 m Patient Age:    73 years    BP:           149/62 mmHg Patient Gender: F           HR:           62 bpm. Exam Location:  Inpatient Procedure: 2D Echo, Cardiac Doppler, Color Doppler and Intracardiac            Opacification Agent (Both Spectral and Color Flow Doppler were            utilized during procedure). Indications:    Chest Pain  History:        Patient has no prior history of Echocardiogram examinations. CAD                 and Previous Myocardial  Infarction, Prior CABG; Risk                 Factors:Diabetes and Hypertension.  Sonographer:    Jayson Gaskins Referring Phys: HUSAM M SALAH IMPRESSIONS  1. Left ventricular ejection fraction, by estimation, is 50 to 55%. The left ventricle has low normal function. The left ventricle demonstrates regional wall motion abnormalities (see scoring diagram/findings for description). Left ventricular diastolic  parameters are consistent with Grade II diastolic dysfunction (pseudonormalization).  2. Right ventricular systolic function is normal. The right ventricular size is normal. Tricuspid regurgitation signal is inadequate for assessing PA pressure.  3. The mitral valve is degenerative. Trivial mitral valve regurgitation. No evidence of mitral stenosis.  4. The aortic valve is tricuspid. Aortic valve regurgitation is not visualized. Aortic valve sclerosis is present, with no evidence of aortic valve stenosis.  5. The inferior vena cava is normal in size with greater than 50% respiratory variability, suggesting right atrial pressure of 3 mmHg. Conclusion(s)/Recommendation(s): No left ventricular mural or apical thrombus/thrombi. FINDINGS  Left Ventricle: Left ventricular ejection fraction, by estimation, is 50 to 55%. The left ventricle has low normal function. The left ventricle demonstrates regional wall motion abnormalities. Definity  contrast agent was given IV to delineate the left ventricular endocardial borders. The left ventricular internal cavity size was normal in size. There is no left ventricular hypertrophy. Abnormal (paradoxical) septal motion consistent with post-operative status. Left ventricular diastolic parameters are  consistent with Grade II diastolic dysfunction (pseudonormalization).  LV Wall Scoring: The apical septal segment and apex are akinetic. Right Ventricle: The right ventricular size is normal. No increase in right ventricular wall thickness. Right ventricular systolic function is normal.  Tricuspid regurgitation signal is inadequate for assessing PA pressure. Left Atrium: Left atrial size was normal in size. Right Atrium: Right atrial size was normal in size. Pericardium: There is no evidence of pericardial effusion. Presence of epicardial fat layer. Mitral Valve: The mitral valve is degenerative in appearance. There is mild calcification of the mitral valve leaflet(s). Mild to moderate  mitral annular calcification. Trivial mitral valve regurgitation. No evidence of mitral valve stenosis. Tricuspid Valve: The tricuspid valve is grossly normal. Tricuspid valve regurgitation is not demonstrated. Aortic Valve: The aortic valve is tricuspid. Aortic valve regurgitation is not visualized. Aortic valve sclerosis is present, with no evidence of aortic valve stenosis. Aortic valve mean gradient measures 5.0 mmHg. Aortic valve peak gradient measures 8.8  mmHg. Aortic valve area, by VTI measures 1.72 cm. Pulmonic Valve: The pulmonic valve was grossly normal. Pulmonic valve regurgitation is trivial. No evidence of pulmonic stenosis. Aorta: The aortic root is normal in size and structure. Venous: The inferior vena cava is normal in size with greater than 50% respiratory variability, suggesting right atrial pressure of 3 mmHg. IAS/Shunts: The atrial septum is grossly normal.  LEFT VENTRICLE PLAX 2D LVIDd:         5.20 cm   Diastology LVIDs:         3.80 cm   LV e' medial:    5.77 cm/s LV PW:         0.80 cm   LV E/e' medial:  17.3 LV IVS:        0.70 cm   LV e' lateral:   8.81 cm/s LVOT diam:     1.70 cm   LV E/e' lateral: 11.3 LV SV:         54 LV SV Index:   32 LVOT Area:     2.27 cm  RIGHT VENTRICLE RV S prime:     11.40 cm/s TAPSE (M-mode): 1.8 cm LEFT ATRIUM             Index        RIGHT ATRIUM           Index LA Vol (A2C):   35.5 ml 21.06 ml/m  RA Area:     14.10 cm LA Vol (A4C):   51.8 ml 30.73 ml/m  RA Volume:   31.80 ml  18.87 ml/m LA Biplane Vol: 45.5 ml 26.99 ml/m  AORTIC VALVE AV Area (Vmax):     1.63 cm AV Area (Vmean):   1.82 cm AV Area (VTI):     1.72 cm AV Vmax:           148.00 cm/s AV Vmean:          99.000 cm/s AV VTI:            0.312 m AV Peak Grad:      8.8 mmHg AV Mean Grad:      5.0 mmHg LVOT Vmax:         106.00 cm/s LVOT Vmean:        79.300 cm/s LVOT VTI:          0.236 m LVOT/AV VTI ratio: 0.76  AORTA Ao Root diam: 2.60 cm MITRAL VALVE MV Area (PHT): 3.51 cm    SHUNTS MV Decel Time: 216 msec    Systemic VTI:  0.24 m MV E velocity: 99.60 cm/s  Systemic Diam: 1.70 cm MV A velocity: 96.90 cm/s MV E/A ratio:  1.03 Darryle Decent MD Electronically signed by Darryle Decent MD Signature Date/Time: 08/26/2023/1:12:56 PM    Final    CARDIAC CATHETERIZATION Result Date: 08/26/2023   Mid LM to Dist LM lesion is 90% stenosed.   A drug-eluting stent was successfully placed using a STENT SYNERGY XD 3.50X12.   Post intervention, there is a 0% residual stenosis.   Recommend uninterrupted dual antiplatelet therapy with Aspirin  81mg  daily and Clopidogrel  75mg   daily for a minimum of 12 months (ACS-Class I recommendation). Successful PCI of the distal Left main with IVUS guidance, intracoronary lithotripsy and DES Plan; DAPT for one year. Anticipate DC in am   CARDIAC CATHETERIZATION Result Date: 08/25/2023   Prox RCA-1 lesion is 95% stenosed.   Prox RCA-2 lesion is 95% stenosed.   Prox LAD to Mid LAD lesion is 100% stenosed.   1st Mrg lesion is 90% stenosed.   Mid LM to Dist LM lesion is 90% stenosed.   LIMA graft was visualized by angiography and is normal in caliber.   SVG graft was visualized by angiography and is normal in caliber.   SVG graft was visualized by angiography and is normal in caliber.   The graft exhibits no disease.   The graft exhibits no disease.   The graft exhibits no disease.   LV end diastolic pressure is normal. Severe 3 vessel and distal left main disease Patent LIMA to the LAD Patent SVG to OM1 Patent SVG to RCA Normal LVEDP 8 mm Hg Plan: will hydrate and repeat BMET in am.  Check LV function by Echo. The left main stenosis supplies the distal LCx which is a co dominant vessel and is the likely source of her chest pain. Would consider PCI of the distal left main with Shockwave lithotripsy.    Cardiac Studies  Cath 08/25/2023   Prox RCA-1 lesion is 95% stenosed.   Prox RCA-2 lesion is 95% stenosed.   Prox LAD to Mid LAD lesion is 100% stenosed.   1st Mrg lesion is 90% stenosed.   Mid LM to Dist LM lesion is 90% stenosed.   LIMA graft was visualized by angiography and is normal in caliber.   SVG graft was visualized by angiography and is normal in caliber.   SVG graft was visualized by angiography and is normal in caliber.   The graft exhibits no disease.   The graft exhibits no disease.   The graft exhibits no disease.   LV end diastolic pressure is normal.   Severe 3 vessel and distal left main disease Patent LIMA to the LAD Patent SVG to OM1 Patent SVG to RCA Normal LVEDP 8 mm Hg   Plan: will hydrate and repeat BMET in am. Check LV function by Echo. The left main stenosis supplies the distal LCx which is a co dominant vessel and is the likely source of her chest pain. Would consider PCI of the distal left main with Shockwave lithotripsy.    Cath 08/26/2023   Mid LM to Dist LM lesion is 90% stenosed.   A drug-eluting stent was successfully placed using a STENT SYNERGY XD 3.50X12.   Post intervention, there is a 0% residual stenosis.   Recommend uninterrupted dual antiplatelet therapy with Aspirin  81mg  daily and Clopidogrel  75mg  daily for a minimum of 12 months (ACS-Class I recommendation).   Successful PCI of the distal Left main with IVUS guidance, intracoronary lithotripsy and DES      Plan; DAPT for one year. Anticipate DC in am    Echo 08/26/2023 1. Left ventricular ejection fraction, by estimation, is 50 to 55%. The  left ventricle has low normal function. The left ventricle demonstrates  regional wall motion abnormalities (see scoring  diagram/findings for  description). Left ventricular diastolic   parameters are consistent with Grade II diastolic dysfunction  (pseudonormalization).   2. Right ventricular systolic function is normal. The right ventricular  size is normal. Tricuspid regurgitation signal is inadequate for assessing  PA pressure.   3. The mitral valve is degenerative. Trivial mitral valve regurgitation.  No evidence of mitral stenosis.   4. The aortic valve is tricuspid. Aortic valve regurgitation is not  visualized. Aortic valve sclerosis is present, with no evidence of aortic  valve stenosis.   5. The inferior vena cava is normal in size with greater than 50%  respiratory variability, suggesting right atrial pressure of 3 mmHg.   Conclusion(s)/Recommendation(s): No left ventricular mural or apical  thrombus/thrombi.   Patient Profile   73 y.o. 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 Cardiac cath revealed a 90% LM lesion treated with PCI  Assessment & Plan  Unstable angina  - Cath 08/25/2023 showed severe 3v native CAD with patent LIMA-LAD, patent SVG-OM1, patent SVG-RCA. 90% distal LM stenosis that supplies the distal LCx which was a codominant vessel.   - Cath 08/26/2023 showed 90% mid to distal LM treated with Synergy XD 3.5x12 mm DES.   - plan for ASA and plavix  for 12 month. Echo showed EF 50-55%, no mural or apical thrombus.   - patient says she has a cardiologist Dr. Laverda of [place], can follow up with her cardiologist in 2-4 weeks after discharge. I have sent 1 year refill of Plavix  to her pharmacy to make sure she has enough medication.   CAD s/p CABG: patent grafts on cath.  Hypertension: continue amlodipine , Imdur , metoprolol . Home losartan  held during this admission, can resume on discharge    Hyperlipidemia: LDL goal <55. On  rosuvastatin . LDL has been in the 60s, consider add Zetia 10mg  daily. FLP and LFT in 2 month.  DM II  CKD stage IIIa  Bradycardia: asymptomatic    For questions or updates, please contact [facility] Please consult [email or web address] for contact info under     Signed, Scot Ford, PA  08/27/2023, 9:21 AM    History and all data above reviewed.  I personally took the history today, performed the physical exam and formulated the substantive portion of the assessment and plan.  I reviewed all relevant tests and studies.   No chest pain.  No SOB.  Patient examined.  I agree with the findings as above.  The patient exam reveals COR:RRR  ,  Lungs: Clear  ,  Abd: Positive bowel sounds, no rebound no guarding, Ext No edema, bilateral radial access sites without bleeding or bruising  .  All available labs, radiology testing, previous records reviewed. Agree with documented assessment and plan.  CAD:  Home on meds as on MAR with resumption of Cozaar  at home.  Dyslipidemia:  I did change to Crestor  from Lipitor .  Follow up per her request with her previous cardiologist not in our practice  Lynwood Schilling  11:32 AM  08/27/2023

## 2023-08-27 NOTE — TOC CM/SW Note (Signed)
 Transition of Care [facility]) - Inpatient Brief Assessment   Patient Details  Name: Tasha Garrett MRN: [id number] Date of Birth: [birth year]  [facility]) CM/SW Contact:    Sudie Erminio Deems, RN Phone Number: 08/27/2023, 11:33 AM   Clinical Narrative: Patient presented for unstable angina. PTA patient was from home with support of daughter. Patient has PCP and transportation to get to appointments. No home needs identified. Daughter to transport home via private vehicle.    Transition of Care Asessment: Insurance and Status: Insurance coverage has been reviewed Patient has primary care physician: Yes Home environment has been reviewed: reviewed Prior level of function:: independent Prior/Current Home Services: No current home services Social Drivers of Health Review: SDOH reviewed no interventions necessary Readmission risk has been reviewed: Yes Transition of care needs: no transition of care needs at this time

## 2023-08-27 NOTE — Progress Notes (Signed)
 AVS and education completed with patient and daughter.  Both expressed verbal understanding of discharge plan of care.  Patient's blood glucose elevated at 0827a.m.  Primary RN Patience at bedside.  Patient verbally refused blood glucose check.  Risks vs Benefits discussed patient.  PIV removed tele removed.  Denies s/sx pain or discomfort at this time.  No sx of hyperglycemia noted.  Patient discharged.  Volunteer called for transport to main entrance.

## 2023-08-30 ENCOUNTER — Telehealth: Payer: Self-pay

## 2023-08-30 NOTE — Transitions of Care (Post Inpatient/ED Visit) (Signed)
   08/30/2023  Name: Tasha Garrett MRN: [id number] DOB: [birth year]  Today's TOC FU Call Status: Today's TOC FU Call Status:: Unsuccessful Call (1st Attempt) Unsuccessful Call (1st Attempt) Date: 08/30/23  Attempted to reach the patient regarding the most recent Inpatient/ED visit.  Follow Up Plan: Additional outreach attempts will be made to reach the patient to complete the Transitions of Care (Post Inpatient/ED visit) call.    Alan Ee, RN, BSN, CEN [employer]- Transition of Care Team.  [facility] [phone]

## 2023-08-31 ENCOUNTER — Telehealth: Payer: Self-pay

## 2023-08-31 NOTE — Transitions of Care (Post Inpatient/ED Visit) (Signed)
 08/31/2023  Name: Tasha Garrett MRN: [id number] DOB: [birth year]  Today's TOC FU Call Status: Today's TOC FU Call Status:: Successful TOC FU Call Completed TOC FU Call Complete Date: 08/31/23 Patient's Name and Date of Birth confirmed.  Transition Care Management Follow-up Telephone Call How have you been since you were released from the hospital?: Better (feels better, no chest pain and nop shortness of breath) Any questions or concerns?: No  Items Reviewed: Did you receive and understand the discharge instructions provided?: Yes Medications obtained,verified, and reconciled?: Yes (Medications Reviewed) Any new allergies since your discharge?: No Dietary orders reviewed?: No Do you have support at home?: Yes People in Home [RPT]: child(ren), adult, grandchild(ren)  Medications Reviewed Today: Medications Reviewed Today     Reviewed by Rumalda Alan PENNER, RN (Registered Nurse) on 08/31/23 at 1110  Med List Status: <None>   Medication Order Taking? Sig Documenting Provider Last Dose Status Informant  amLODipine  (NORVASC ) 10 MG tablet [id number] Yes TAKE 1 TABLET EVERY DAY Theophilus Andrews, Tully GRADE, MD  Active Self, Child, Pharmacy Records  aspirin  EC 81 MG tablet [id number] Yes Take 81 mg by mouth in the morning. [provider]  Active Self, Child, Pharmacy Records  brimonidine  (ALPHAGAN ) 0.2 % ophthalmic solution [id number] Yes Place 1 drop into both eyes in the morning and at bedtime. [provider]  Active Self, Child, Pharmacy Records  clopidogrel  (PLAVIX ) 75 MG tablet [id number] Yes Take 1 tablet (75 mg total) by mouth daily. Janene Boer, [place]  Active   [facility] Liver Oil CAPS [id number] Yes Take 1 capsule by mouth in the morning. [provider]  Active Self, Child, Pharmacy Records  isosorbide  mononitrate (IMDUR ) 30 MG 24 hr tablet [id number] Yes TAKE 1 TABLET EVERY DAY Theophilus Andrews, Tully GRADE, MD  Active Self, Child, Pharmacy Records  latanoprost  (XALATAN ) 0.005 %  ophthalmic solution [id number] Yes Place 1 drop into both eyes at bedtime.   Active Self, Child, Pharmacy Records  levothyroxine  (SYNTHROID ) 137 MCG tablet [id number] Yes Take 1 tablet (137 mcg total) by mouth daily. Theophilus Andrews, Tully GRADE, MD  Active Self, Child, Pharmacy Records  losartan  (COZAAR ) 100 MG tablet [id number] Yes TAKE 1 TABLET (100 MG TOTAL) BY MOUTH DAILY. Theophilus Andrews, Tully GRADE, MD  Active Self, Child, Pharmacy Records  metoprolol  tartrate (LOPRESSOR ) 25 MG tablet [id number] Yes Take 1 tablet (25 mg total) by mouth 2 (two) times daily. Theophilus Andrews, Tully GRADE, MD  Active Self, Child, Pharmacy Records  Multiple Vitamin (MULTIVITAMIN WITH MINERALS) TABS tablet [id number] Yes Take 1 tablet by mouth in the morning. [provider]  Active Self, Child, Pharmacy Records  Polyethyl Glyc-Propyl Glyc PF (SYSTANE HYDRATION PF) 0.4-0.3 % SOLN [id number] Yes Place 1 drop into both eyes daily as needed (dry, gritty eyes). [provider]  Active Self, Child, Pharmacy Records  rosuvastatin  (CRESTOR ) 40 MG tablet [id number] Yes Take 1 tablet (40 mg total) by mouth daily. Briana Elgin LABOR, MD  Active             Home Care and Equipment/Supplies: Were Home Health Services Ordered?: No Any new equipment or medical supplies ordered?: No  Functional Questionnaire: Do you need assistance with bathing/showering or dressing?: No Do you need assistance with meal preparation?: No Do you need assistance with eating?: No Do you have difficulty maintaining continence: No Do you need assistance with getting out of bed/getting out of a chair/moving?: No Do you have difficulty managing or taking your medications?: No  Follow up appointments  reviewed: PCP Follow-up appointment confirmed?: Yes Date of PCP follow-up appointment?: 09/08/23 Follow-up Provider: PCP Specialist Hospital Follow-up appointment confirmed?: Yes Date of Specialist follow-up appointment?: 09/10/23 Follow-Up  Specialty Provider:: Cardiology Do you need transportation to your follow-up appointment?: No Do you understand care options if your condition(s) worsen?: Yes-patient verbalized understanding  SDOH Interventions Today    Flowsheet Row Most Recent Value  SDOH Interventions   Food Insecurity Interventions Intervention Not Indicated  Housing Interventions Intervention Not Indicated  Transportation Interventions Intervention Not Indicated  Utilities Interventions Intervention Not Indicated   Recent admission for chest pain. Had a cardiac cath and stent placement. No chest pain since discharged.  Taking all medications as prescribed.  Reviewed and offered TOC program and patient provided consent.   Goals Addressed             This Visit's Progress    [facility] Transitions of Care (TOC) Care Plan       Problems:  Recent Hospitalization for treatment of chest pain , unstable angina- stent placement   Goal:  Over the next 30 days, the patient will not experience hospital readmission  Interventions:  Transitions of Care: Doctor Visits  - discussed the importance of doctor visits Reviewed recent admission for chest pain and stent placement Confirmed pateint has all her medications and knows how to take them  This note sent to PCP Reviewed and offered 30 day TOC program and patient consented.   CAD Interventions: Assessed understanding of CAD diagnosis Medications reviewed including medications utilized in CAD treatment plan Reviewed Importance of taking all medications as prescribed Reviewed Importance of attending all scheduled provider appointments Assessed social determinant of health barriers Reviewed chest pain and need to call 911 if she has chest pain or shortness of breath. Reviewed pending cardiology follow up at her cardiology office in [place]. Confirmed transportation Reviewed referral for cardiac rehab and patient to discuss with cardiologist next week  Patient  Self Care Activities:  Attend all scheduled provider appointments Call pharmacy for medication refills 3-7 days in advance of running out of medications Call provider office for new concerns or questions  Notify RN Care Manager of TOC call rescheduling needs Participate in Transition of Care Program/Attend TOC scheduled calls Perform all self care activities independently  Take medications as prescribed   Call 911 for chest pain or shortness of breath  Plan:  Telephone follow up appointment with care management team member scheduled for:  09/07/2023  at 2pm The patient has been provided with contact information for the care management team and has been advised to call with any health related questions or concerns.         Alan Ee, RN, BSN, CEN [employer]- Transition of Care Team.  [facility] [phone]

## 2023-09-02 ENCOUNTER — Telehealth (HOSPITAL_COMMUNITY): Payer: Self-pay

## 2023-09-02 NOTE — Telephone Encounter (Signed)
Per phase I cardiac rehab, fax referral to [facility].

## 2023-09-07 ENCOUNTER — Other Ambulatory Visit: Payer: Self-pay

## 2023-09-07 NOTE — Patient Instructions (Signed)
 Visit Information  Thank you for taking time to visit with me today. Please don't hesitate to contact me if I can be of assistance to you before our next scheduled telephone appointment.  Our next appointment is by telephone on 09/15/2023 2pm   Following is a copy of your care plan:   Goals Addressed             This Visit's Progress    [facility] Transitions of Care (TOC) Care Plan       Problems:  Recent Hospitalization for treatment of chest pain , unstable angina- stent placement  09/07/2023  Denies any chest pain or shortness of breath. Reports I am doing fine.    Goal:  Over the next 30 days, the patient will not experience hospital readmission  Interventions:  Transitions of Care: Doctor Visits  - discussed the importance of doctor visits Assessed for chest pain- none Confirmed pateint has all her medications and knows how to take them  Reviewed when to call 911 Reviewed and offered 30 day TOC program and patient consented.   CAD Interventions: Assessed understanding of CAD diagnosis Medications reviewed including medications utilized in CAD treatment plan Reviewed Importance of taking all medications as prescribed Reviewed Importance of attending all scheduled provider appointments Reviewed chest pain and need to call 911 if she has chest pain or shortness of breath. Reviewed pending cardiology follow up at her cardiology office in [place]. Confirmed transportation Reviewed referral for cardiac rehab and patient to discuss with cardiologist next week  Patient Self Care Activities:  Attend all scheduled provider appointments Call pharmacy for medication refills 3-7 days in advance of running out of medications Call provider office for new concerns or questions  Notify RN Care Manager of TOC call rescheduling needs Participate in Transition of Care Program/Attend TOC scheduled calls Perform all self care activities independently  Take medications as prescribed    Call 911 for chest pain or shortness of breath  Plan:  Telephone follow up appointment with care management team member scheduled for:  09/15/2023  at 2pm The patient has been provided with contact information for the care management team and has been advised to call with any health related questions or concerns.         Patient verbalizes understanding of instructions and care plan provided today and agrees to view in MyChart. Active MyChart status and patient understanding of how to access instructions and care plan via MyChart confirmed with patient.     Telephone follow up appointment with care management team member scheduled for:  09/15/2023 at 2pm  Please call the care guide team at [phone] if you need to cancel or reschedule your appointment.   Please call the Suicide and Crisis Lifeline: 988 if you are experiencing a Mental Health or Behavioral Health Crisis or need someone to talk to.  Alan Ee, RN, BSN, CEN [employer]- Transition of Care Team.  [facility] [phone]

## 2023-09-07 NOTE — Transitions of Care (Post Inpatient/ED Visit) (Signed)
 Transition of Care week 2  Visit Note  09/07/2023  Name: Tasha Garrett MRN: [id number]          DOB: 05-13-1950  Situation: Patient enrolled in [facility] 30-day program. Visit completed with patient by telephone.   Background:   Initial Transition Care Management Follow-up Telephone Call    Past Medical History:  Diagnosis Date   Arthritis    Carotid artery occlusion    Chronic kidney disease    stage 3A   Coronary arteriosclerosis    Diabetes mellitus without complication (HCC)    type 2, no meds, does not check blood sugar per pt on 04/27/23   High cholesterol    Hypertension    Hypothyroid    Myocardial infarction [facility]) 2012   in [place]   Urinary tract infection     Assessment: Patient reports that she is feeling good. Denies any chest pain or shortness of breath. Denies any new concerns today.  Patient Reported Symptoms: Cognitive Cognitive Status: Able to follow simple commands, Alert and oriented to person, place, and time, Normal speech and language skills      Neurological Neurological Review of Symptoms: No symptoms reported    HEENT HEENT Symptoms Reported: No symptoms reported      Cardiovascular Cardiovascular Symptoms Reported: Other: Other Cardiovascular Symptoms: denies any concerns. no chest pain , no shortness of breath Does patient have uncontrolled Hypertension?: No Cardiovascular Management Strategies: Routine screening  Respiratory Respiratory Symptoms Reported: No symptoms reported    Endocrine Endocrine Symptoms Reported: No symptoms reported Is patient diabetic?: No    Gastrointestinal Gastrointestinal Symptoms Reported: No symptoms reported      Genitourinary Genitourinary Symptoms Reported: No symptoms reported    Integumentary Integumentary Symptoms Reported: No symptoms reported    Musculoskeletal Musculoskelatal Symptoms Reviewed: No symptoms reported        Psychosocial Psychosocial Symptoms Reported: No symptoms reported          Vitals:   09/07/23 1403  BP: 137/64    Medications Reviewed Today     Reviewed by Rumalda Alan PENNER, RN (Registered Nurse) on 09/07/23 at 1402  Med List Status: <None>   Medication Order Taking? Sig Documenting Provider Last Dose Status Informant  amLODipine  (NORVASC ) 10 MG tablet [id number] Yes TAKE 1 TABLET EVERY DAY Theophilus Andrews, Tully GRADE, MD  Active Self, Child, Pharmacy Records  aspirin  EC 81 MG tablet [id number] Yes Take 81 mg by mouth in the morning. [provider]  Active Self, Child, Pharmacy Records  brimonidine  (ALPHAGAN ) 0.2 % ophthalmic solution [id number] Yes Place 1 drop into both eyes in the morning and at bedtime. [provider]  Active Self, Child, Pharmacy Records  clopidogrel  (PLAVIX ) 75 MG tablet [id number] Yes Take 1 tablet (75 mg total) by mouth daily. Janene Boer, [place]  Active   [facility] Liver Oil CAPS [id number] Yes Take 1 capsule by mouth in the morning. [provider]  Active Self, Child, Pharmacy Records  isosorbide  mononitrate (IMDUR ) 30 MG 24 hr tablet [id number] Yes TAKE 1 TABLET EVERY DAY Theophilus Andrews, Tully GRADE, MD  Active Self, Child, Pharmacy Records  latanoprost  (XALATAN ) 0.005 % ophthalmic solution [id number] Yes Place 1 drop into both eyes at bedtime.   Active Self, Child, Pharmacy Records  levothyroxine  (SYNTHROID ) 137 MCG tablet [id number] Yes Take 1 tablet (137 mcg total) by mouth daily. Theophilus Andrews, Tully GRADE, MD  Active Self, Child, Pharmacy Records  losartan  (COZAAR ) 100 MG tablet [id number] Yes TAKE 1 TABLET (100 MG TOTAL)  BY MOUTH DAILY. Theophilus Andrews, Tully GRADE, MD  Active Self, Child, Pharmacy Records  metoprolol  tartrate (LOPRESSOR ) 25 MG tablet [id number] Yes Take 1 tablet (25 mg total) by mouth 2 (two) times daily. Theophilus Andrews, Tully GRADE, MD  Active Self, Child, Pharmacy Records  Multiple Vitamin (MULTIVITAMIN WITH MINERALS) TABS tablet [id number] Yes Take 1 tablet by mouth in the morning. [provider]  Active Self, Child, Pharmacy Records  Polyethyl Glyc-Propyl Glyc PF (SYSTANE HYDRATION PF) 0.4-0.3 % SOLN [id number] Yes Place 1 drop into both eyes daily as needed (dry, gritty eyes). [provider]  Active Self, Child, Pharmacy Records  rosuvastatin  (CRESTOR ) 40 MG tablet [id number] Yes Take 1 tablet (40 mg total) by mouth daily. Briana Elgin LABOR, MD  Active             Goals       Increase physical activity (pt-stated)      Lose weight.      Protect My Health      Timeframe:  Long-Range Goal Priority:  Medium Start Date:                             Expected End Date:                       Follow Up Date 05/19/2023    - schedule and keep appointment for annual check-up    Why is this important?   Screening tests can find diseases early when they are easier to treat.  Your doctor or nurse will talk with you about which tests are important for you.  Getting shots for common diseases like the flu and shingles will help prevent them.     Notes:       [facility] Transitions of Care (TOC) Care Plan      Problems:  Recent Hospitalization for treatment of chest pain , unstable angina- stent placement  09/07/2023  Denies any chest pain or shortness of breath. Reports I am doing fine.    Goal:  Over the next 30 days, the patient will not experience hospital readmission  Interventions:  Transitions of Care: Doctor Visits  - discussed the importance of doctor visits Assessed for chest pain- none Confirmed pateint has all her medications and knows how to take them  Reviewed when to call 911 Reviewed and offered 30 day TOC program and patient consented.   CAD Interventions: Assessed understanding of CAD diagnosis Medications reviewed including medications utilized in CAD treatment plan Reviewed Importance of taking all medications as prescribed Reviewed Importance of attending all scheduled provider appointments Reviewed chest pain and need to call 911 if she has chest  pain or shortness of breath. Reviewed pending cardiology follow up at her cardiology office in [place]. Confirmed transportation Reviewed referral for cardiac rehab and patient to discuss with cardiologist next week  Patient Self Care Activities:  Attend all scheduled provider appointments Call pharmacy for medication refills 3-7 days in advance of running out of medications Call provider office for new concerns or questions  Notify RN Care Manager of TOC call rescheduling needs Participate in Transition of Care Program/Attend TOC scheduled calls Perform all self care activities independently  Take medications as prescribed   Call 911 for chest pain or shortness of breath  Plan:  Telephone follow up appointment with care management team member scheduled for:  09/15/2023  at 2pm The patient has been provided with contact  information for the care management team and has been advised to call with any health related questions or concerns.         Recommendation:   Continue Current Plan of Care  Follow Up Plan:   Telephone follow up appointment date/time:  09/15/2023 at 2pm  Alan Ee, RN, BSN, CEN [facility]- Transition of Care Team.  [facility] [phone]

## 2023-09-08 ENCOUNTER — Encounter: Payer: Self-pay | Admitting: Internal Medicine

## 2023-09-08 ENCOUNTER — Telehealth

## 2023-09-08 ENCOUNTER — Other Ambulatory Visit (HOSPITAL_COMMUNITY): Payer: Self-pay

## 2023-09-08 ENCOUNTER — Other Ambulatory Visit: Payer: Self-pay

## 2023-09-08 ENCOUNTER — Ambulatory Visit (INDEPENDENT_AMBULATORY_CARE_PROVIDER_SITE_OTHER): Admitting: Internal Medicine

## 2023-09-08 VITALS — BP 130/70 | HR 64 | Temp 97.8°F | Wt 158.6 lb

## 2023-09-08 DIAGNOSIS — I2510 Atherosclerotic heart disease of native coronary artery without angina pectoris: Secondary | ICD-10-CM | POA: Diagnosis not present

## 2023-09-08 DIAGNOSIS — E039 Hypothyroidism, unspecified: Secondary | ICD-10-CM

## 2023-09-08 DIAGNOSIS — Z09 Encounter for follow-up examination after completed treatment for conditions other than malignant neoplasm: Secondary | ICD-10-CM

## 2023-09-08 DIAGNOSIS — I10 Essential (primary) hypertension: Secondary | ICD-10-CM | POA: Diagnosis not present

## 2023-09-08 DIAGNOSIS — N1831 Chronic kidney disease, stage 3a: Secondary | ICD-10-CM

## 2023-09-08 DIAGNOSIS — I200 Unstable angina: Secondary | ICD-10-CM

## 2023-09-08 MED ORDER — METOPROLOL TARTRATE 25 MG PO TABS
25.0000 mg | ORAL_TABLET | Freq: Two times a day (BID) | ORAL | 1 refills | Status: AC
  Filled 2023-09-08 – 2023-11-15 (×2): qty 180, 90d supply, fill #0
  Filled 2024-02-10: qty 180, 90d supply, fill #1

## 2023-09-08 MED ORDER — CLOPIDOGREL BISULFATE 75 MG PO TABS
75.0000 mg | ORAL_TABLET | Freq: Every day | ORAL | 1 refills | Status: AC
  Filled 2023-09-08 – 2023-11-15 (×2): qty 90, 90d supply, fill #0
  Filled 2024-02-10: qty 90, 90d supply, fill #1

## 2023-09-08 MED ORDER — LOSARTAN POTASSIUM 100 MG PO TABS
100.0000 mg | ORAL_TABLET | Freq: Every day | ORAL | 1 refills | Status: DC
  Filled 2023-09-08 – 2023-11-15 (×2): qty 90, 90d supply, fill #0

## 2023-09-08 MED ORDER — ROSUVASTATIN CALCIUM 40 MG PO TABS
40.0000 mg | ORAL_TABLET | Freq: Every day | ORAL | 1 refills | Status: AC
  Filled 2023-09-08 – 2023-11-15 (×2): qty 90, 90d supply, fill #0
  Filled 2024-02-10: qty 90, 90d supply, fill #1

## 2023-09-08 MED ORDER — AMLODIPINE BESYLATE 10 MG PO TABS
10.0000 mg | ORAL_TABLET | Freq: Every day | ORAL | 1 refills | Status: AC
  Filled 2023-09-08: qty 90, 90d supply, fill #0
  Filled 2024-02-10: qty 90, 90d supply, fill #1

## 2023-09-08 MED ORDER — ISOSORBIDE MONONITRATE ER 30 MG PO TB24
30.0000 mg | ORAL_TABLET | Freq: Every day | ORAL | 1 refills | Status: DC
  Filled 2023-09-08 – 2023-11-15 (×2): qty 90, 90d supply, fill #0

## 2023-09-08 MED ORDER — LEVOTHYROXINE SODIUM 137 MCG PO TABS
137.0000 ug | ORAL_TABLET | Freq: Every day | ORAL | 1 refills | Status: AC
  Filled 2023-09-08 – 2023-11-03 (×2): qty 90, 90d supply, fill #0
  Filled 2024-01-24 – 2024-01-25 (×2): qty 90, 90d supply, fill #1

## 2023-09-08 NOTE — Progress Notes (Signed)
 Hospital follow-up visit     CC/Reason for Visit: Hospitalization follow-up  HPI: Tasha Garrett is a 73 y.o. female who is coming in today for the above mentioned reasons, specifically transitional care services face-to-face visit.    Dates hospitalized: 08/24/2023-08/27/2023 Days since discharge from hospital: 12 Patient was discharged from the hospital to: Home Reason for admission to hospital: Unstable angina Date of interactive phone contact with patient and/or caregiver: First unsuccessful attempt on 08/30/2023, second attempt was successful on 08/31/2023  I have reviewed in detail patient's discharge summary plus pertinent specific notes, labs, and images from the hospitalization.  Yes  Patient was admitted to the hospital with exertional fatigue and chest pressure.  Her presentation was consistent with unstable angina she had a cardiac cath with a left main distal 90% stenosis status post DES placement.  It was recommended that she stay on Plavix  and aspirin  x 12 months.  She is feeling well, back to baseline.  She has her follow-up with cardiology in 3 days.  Medication reconciliation was done today and patient is taking meds as recommended by discharging hospitalist/specialist.  Yes   Past Medical/Surgical History: Past Medical History:  Diagnosis Date   Arthritis    Carotid artery occlusion    Chronic kidney disease    stage 3A   Coronary arteriosclerosis    Diabetes mellitus without complication (HCC)    type 2, no meds, does not check blood sugar per pt on 04/27/23   High cholesterol    Hypertension    Hypothyroid    Myocardial infarction (HCC) 2012   in [place]   Urinary tract infection     Past Surgical History:  Procedure Laterality Date   ABDOMINAL HYSTERECTOMY     COLONOSCOPY     CORONARY ARTERY BYPASS GRAFT  05/13/2011   CABG x3: LIMA-mid LAD, SVG-OM, SVG-RCA ([facility])   CORONARY LITHOTRIPSY N/A 08/26/2023   Procedure: CORONARY LITHOTRIPSY;   Surgeon: Swaziland, Peter M, MD;  Location: [facility] INVASIVE CV LAB;  Service: Cardiovascular;  Laterality: N/A;   CORONARY STENT INTERVENTION N/A 08/26/2023   Procedure: CORONARY STENT INTERVENTION;  Surgeon: Swaziland, Peter M, MD;  Location: [facility] INVASIVE CV LAB;  Service: Cardiovascular;  Laterality: N/A;   CORONARY ULTRASOUND/IVUS N/A 08/26/2023   Procedure: Coronary Ultrasound/IVUS;  Surgeon: Swaziland, Peter M, MD;  Location: [facility] INVASIVE CV LAB;  Service: Cardiovascular;  Laterality: N/A;   EYE SURGERY Bilateral    cataracts removed   LEFT HEART CATH AND CORONARY ANGIOGRAPHY N/A 08/25/2023   Procedure: LEFT HEART CATH AND CORONARY ANGIOGRAPHY;  Surgeon: Swaziland, Peter M, MD;  Location: [facility] INVASIVE CV LAB;  Service: Cardiovascular;  Laterality: N/A;   TRANSCAROTID ARTERY REVASCULARIZATION  Left 05/05/2023   Procedure: LEFT TRANSCAROTID ARTERY REVASCULARIZATION (TCAR);  Surgeon: Gretta Lonni PARAS, MD;  Location: [facility] OR;  Service: Vascular;  Laterality: Left;    Social History:  reports that she quit smoking about 13 years ago. Her smoking use included cigarettes. She started smoking about 33 years ago. She has a 10 pack-year smoking history. She has never used smokeless tobacco. She reports that she does not currently use alcohol. She reports that she does not currently use drugs.  Allergies: No Known Allergies  Family History:  Family History  Problem Relation Age of Onset   ALS Mother      Current Outpatient Medications:    aspirin  EC 81 MG tablet, Take 81 mg by mouth in the morning., Disp: , Rfl:  brimonidine  (ALPHAGAN ) 0.2 % ophthalmic solution, Place 1 drop into both eyes in the morning and at bedtime., Disp: , Rfl:    Cod Liver Oil CAPS, Take 1 capsule by mouth in the morning., Disp: , Rfl:    latanoprost  (XALATAN ) 0.005 % ophthalmic solution, Place 1 drop into both eyes at bedtime., Disp: 7.5 mL, Rfl: 4   Multiple Vitamin (MULTIVITAMIN WITH MINERALS) TABS tablet, Take 1 tablet by mouth in  the morning., Disp: , Rfl:    Polyethyl Glyc-Propyl Glyc PF (SYSTANE HYDRATION PF) 0.4-0.3 % SOLN, Place 1 drop into both eyes daily as needed (dry, gritty eyes)., Disp: , Rfl:    amLODipine  (NORVASC ) 10 MG tablet, TAKE 1 TABLET EVERY DAY, Disp: 90 tablet, Rfl: 1   clopidogrel  (PLAVIX ) 75 MG tablet, Take 1 tablet (75 mg total) by mouth daily., Disp: 90 tablet, Rfl: 1   isosorbide  mononitrate (IMDUR ) 30 MG 24 hr tablet, Take 1 tablet (30 mg total) by mouth daily., Disp: 90 tablet, Rfl: 1   levothyroxine  (SYNTHROID ) 137 MCG tablet, Take 1 tablet (137 mcg total) by mouth daily., Disp: 90 tablet, Rfl: 1   losartan  (COZAAR ) 100 MG tablet, Take 1 tablet (100 mg total) by mouth daily., Disp: 90 tablet, Rfl: 1   metoprolol  tartrate (LOPRESSOR ) 25 MG tablet, Take 1 tablet (25 mg total) by mouth 2 (two) times daily., Disp: 180 tablet, Rfl: 1   rosuvastatin  (CRESTOR ) 40 MG tablet, Take 1 tablet (40 mg total) by mouth daily., Disp: 90 tablet, Rfl: 1  Review of Systems:  Negative except as mentioned in HPI.    Physical Exam: Vitals:   09/08/23 1504  BP: 130/70  Pulse: 64  Temp: 97.8 F (36.6 C)  TempSrc: Oral  SpO2: 98%  Weight: 158 lb 9.6 oz (71.9 kg)    Body mass index is 29.97 kg/m.   Physical Exam Vitals reviewed.  Constitutional:      Appearance: Normal appearance.  HENT:     Head: Normocephalic and atraumatic.  Eyes:     Conjunctiva/sclera: Conjunctivae normal.  Cardiovascular:     Rate and Rhythm: Normal rate and regular rhythm.  Pulmonary:     Effort: Pulmonary effort is normal.     Breath sounds: Normal breath sounds.  Skin:    General: Skin is warm and dry.  Neurological:     General: No focal deficit present.     Mental Status: She is alert and oriented to person, place, and time.  Psychiatric:        Mood and Affect: Mood normal.        Behavior: Behavior normal.        Thought Content: Thought content normal.        Judgment: Judgment normal.     Impression  and Plan:  Hospital discharge follow-up  Unstable angina (HCC)  Stage 3a chronic kidney disease (HCC)  Coronary arteriosclerosis - Plan: isosorbide  mononitrate (IMDUR ) 30 MG 24 hr tablet, clopidogrel  (PLAVIX ) 75 MG tablet, rosuvastatin  (CRESTOR ) 40 MG tablet  Essential hypertension - Plan: amLODipine  (NORVASC ) 10 MG tablet, losartan  (COZAAR ) 100 MG tablet, metoprolol  tartrate (LOPRESSOR ) 25 MG tablet  Hypothyroidism, unspecified type - Plan: levothyroxine  (SYNTHROID ) 137 MCG tablet  [facility] charts have been reviewed in great detail. - She is currently being compliant with all medication, she understands she needs to take dual antiplatelet therapy for 12 months.  She will follow-up with cardiology in 3 days. - Medication refills have been provided.  Medical decision making of medium complexity  was utilized today.     Tully Theophilus Andrews, MD [facility] Herlene

## 2023-09-08 NOTE — Progress Notes (Signed)
 Patient reports a low glucose reading of 52 mg/dl.  Woodrow, her daughter, assisted her by giving her some candy due to the patient feeling faint.  After a few minutes she checked her glucose and her level was at 95 mg/dl.

## 2023-09-10 DIAGNOSIS — Z6829 Body mass index (BMI) 29.0-29.9, adult: Secondary | ICD-10-CM | POA: Diagnosis not present

## 2023-09-10 DIAGNOSIS — I6529 Occlusion and stenosis of unspecified carotid artery: Secondary | ICD-10-CM | POA: Diagnosis not present

## 2023-09-10 DIAGNOSIS — E785 Hyperlipidemia, unspecified: Secondary | ICD-10-CM | POA: Diagnosis not present

## 2023-09-10 DIAGNOSIS — Z87891 Personal history of nicotine dependence: Secondary | ICD-10-CM | POA: Diagnosis not present

## 2023-09-10 DIAGNOSIS — R001 Bradycardia, unspecified: Secondary | ICD-10-CM | POA: Diagnosis not present

## 2023-09-10 DIAGNOSIS — I2510 Atherosclerotic heart disease of native coronary artery without angina pectoris: Secondary | ICD-10-CM | POA: Diagnosis not present

## 2023-09-10 DIAGNOSIS — N183 Chronic kidney disease, stage 3 unspecified: Secondary | ICD-10-CM | POA: Diagnosis not present

## 2023-09-10 DIAGNOSIS — I10 Essential (primary) hypertension: Secondary | ICD-10-CM | POA: Diagnosis not present

## 2023-09-15 ENCOUNTER — Other Ambulatory Visit: Payer: Self-pay

## 2023-09-15 NOTE — Transitions of Care (Post Inpatient/ED Visit) (Signed)
 Transition of Care week 3  Visit Note  09/15/2023  Name: Tasha Garrett MRN: [id number]          DOB: [birth year]  Situation: Patient enrolled in [facility] 30-day program. Visit completed with patient by telephone.   Background:   Initial Transition Care Management Follow-up Telephone Call    Past Medical History:  Diagnosis Date   Arthritis    Carotid artery occlusion    Chronic kidney disease    stage 3A   Coronary arteriosclerosis    Diabetes mellitus without complication (HCC)    type 2, no meds, does not check blood sugar per pt on 04/27/23   High cholesterol    Hypertension    Hypothyroid    Myocardial infarction [facility]) 2012   in [place]   Urinary tract infection     Assessment: Reports she is doing well. No chest pain.  Patient Reported Symptoms: Cognitive Cognitive Status: Able to follow simple commands, Alert and oriented to person, place, and time, Normal speech and language skills      Neurological Neurological Review of Symptoms: No symptoms reported    HEENT HEENT Symptoms Reported: No symptoms reported      Cardiovascular Cardiovascular Symptoms Reported: Other: Other Cardiovascular Symptoms: reports she saw cardiology and metoprolol  was cut in half.  No chest pain or shortness of breath. Does patient have uncontrolled Hypertension?: No    Respiratory Respiratory Symptoms Reported: No symptoms reported    Endocrine Is patient diabetic?: No    Gastrointestinal Gastrointestinal Symptoms Reported: No symptoms reported      Genitourinary Genitourinary Symptoms Reported: No symptoms reported    Integumentary Integumentary Symptoms Reported: No symptoms reported    Musculoskeletal Musculoskelatal Symptoms Reviewed: No symptoms reported        Psychosocial Psychosocial Symptoms Reported: No symptoms reported         Vitals:   09/15/23 1430  BP: (!) 164/62  Pulse: 65   Today's Vitals   09/15/23 1430  BP: (!) 164/62  Pulse: 65  PainSc: 0-No  pain    Medications Reviewed Today     Reviewed by Rumalda Alan PENNER, RN (Registered Nurse) on 09/15/23 at 1428  Med List Status: <None>   Medication Order Taking? Sig Documenting Provider Last Dose Status Informant  amLODipine  (NORVASC ) 10 MG tablet [id number] Yes Take 1 tablet (10 mg total) by mouth daily. Theophilus Andrews, Tully GRADE, MD  Active   aspirin  EC 81 MG tablet [id number] Yes Take 81 mg by mouth in the morning. [provider]  Active Self, Child, Pharmacy Records  brimonidine  (ALPHAGAN ) 0.2 % ophthalmic solution [id number] Yes Place 1 drop into both eyes in the morning and at bedtime. [provider]  Active Self, Child, Pharmacy Records  clopidogrel  (PLAVIX ) 75 MG tablet [id number] Yes Take 1 tablet (75 mg total) by mouth daily. Theophilus Andrews, Tully GRADE, MD  Active   [facility] Liver Oil CAPS [id number] Yes Take 1 capsule by mouth in the morning. [provider]  Active Self, Child, Pharmacy Records  isosorbide  mononitrate (IMDUR ) 30 MG 24 hr tablet [id number] Yes Take 1 tablet (30 mg total) by mouth daily. Theophilus Andrews, Tully GRADE, MD  Active   latanoprost  (XALATAN ) 0.005 % ophthalmic solution [id number] Yes Place 1 drop into both eyes at bedtime.   Active Self, Child, Pharmacy Records  levothyroxine  (SYNTHROID ) 137 MCG tablet [id number] Yes Take 1 tablet (137 mcg total) by mouth daily. Theophilus Andrews, Tully GRADE, MD  Active   losartan  (COZAAR ) 100  MG tablet [id number] Yes Take 1 tablet (100 mg total) by mouth daily. Theophilus Andrews, Tully GRADE, MD  Active   metoprolol  tartrate (LOPRESSOR ) 25 MG tablet [id number] Yes Take 1 tablet (25 mg total) by mouth 2 (two) times daily.  Patient taking differently: Take 12.5 mg by mouth 2 (two) times daily.   Theophilus Andrews, Tully GRADE, MD  Active   Multiple Vitamin (MULTIVITAMIN WITH MINERALS) TABS tablet [id number] Yes Take 1 tablet by mouth in the morning. [provider]  Active Self, Child, Pharmacy Records  Polyethyl  Glyc-Propyl Glyc PF (SYSTANE HYDRATION PF) 0.4-0.3 % SOLN [id number] Yes Place 1 drop into both eyes daily as needed (dry, gritty eyes). [provider]  Active Self, Child, Pharmacy Records  rosuvastatin  (CRESTOR ) 40 MG tablet [id number] Yes Take 1 tablet (40 mg total) by mouth daily. Theophilus Andrews, Tully GRADE, MD  Active             Goals Addressed             This Visit's Progress    [facility] Transitions of Care [facility]) Care Plan       Problems:  Recent Hospitalization for treatment of chest pain , unstable angina- stent placement  09/07/2023  Denies any chest pain or shortness of breath. Reports I am doing fine. 09/15/2023  Patient reports that she has been feeling tired. Reports that she saw cardiology and that her metoprolol  was deceased to 12.5 mg twice a day. Patient reports that since then she has felt well.  NO chest pain and no shortness of breath. BP elevated today and reports that she is was rushing around this am when she checked her BP. Reports she will recheck later today.    Goal:  Over the next 30 days, the patient will not experience hospital readmission  Interventions:  Transitions of Care: Doctor Visits  - discussed the importance of doctor visits Assessed for chest pain- none Confirmed pateint has all her medications and knows how to take them- reviewed medications changes and and updated chart.  Reviewed when to call 911 Encouraged patient to weigh daily. Reviewed elevated BP today and encouraged patient to recheck and call cardiology if BP remains elevated. Patient verbalized understanding.   CAD Interventions: Assessed understanding of CAD diagnosis Medications reviewed including medications utilized in CAD treatment plan Reviewed Importance of taking all medications as prescribed Reviewed Importance of attending all scheduled provider appointments Reviewed chest pain and need to call 911 if she has chest pain or shortness of breath. Reviewed  cardiology follow up and medications changes.  Patient Self Care Activities:  Attend all scheduled provider appointments Call pharmacy for medication refills 3-7 days in advance of running out of medications Call provider office for new concerns or questions  Notify RN Care Manager of TOC call rescheduling needs Participate in Transition of Care Program/Attend TOC scheduled calls Perform all self care activities independently  Take medications as prescribed   Call 911 for chest pain or shortness of breath  Plan:  Telephone follow up appointment with care management team member scheduled for:  09/010/2025  at 2:15pm The patient has been provided with contact information for the care management team and has been advised to call with any health related questions or concerns.         Recommendation:   Continue Current Plan of Care  Follow Up Plan:   Telephone follow up appointment date/time:  09/22/2023 at 215pm  Alan Ee, RN, BSN, [employer]- Transition  of Care Team.  [facility] [phone]

## 2023-09-15 NOTE — Patient Instructions (Signed)
 Visit Information  Thank you for taking time to visit with me today. Please don't hesitate to contact me if I can be of assistance to you before our next scheduled telephone appointment.  Our next appointment is by telephone on 09/22/2023 at 2:15  Following is a copy of your care plan:   Goals Addressed             This Visit's Progress    [facility] Transitions of Care (TOC) Care Plan       Problems:  Recent Hospitalization for treatment of chest pain , unstable angina- stent placement  09/07/2023  Denies any chest pain or shortness of breath. Reports I am doing fine. 09/15/2023  Patient reports that she has been feeling tired. Reports that she saw cardiology and that her metoprolol  was deceased to 12.5 mg twice a day. Patient reports that since then she has felt well.  NO chest pain and no shortness of breath. BP elevated today and reports that she is was rushing around this am when she checked her BP. Reports she will recheck later today.    Goal:  Over the next 30 days, the patient will not experience hospital readmission  Interventions:  Transitions of Care: Doctor Visits  - discussed the importance of doctor visits Assessed for chest pain- none Confirmed pateint has all her medications and knows how to take them- reviewed medications changes and and updated chart.  Reviewed when to call 911 Encouraged patient to weigh daily. Reviewed elevated BP today and encouraged patient to recheck and call cardiology if BP remains elevated. Patient verbalized understanding.   CAD Interventions: Assessed understanding of CAD diagnosis Medications reviewed including medications utilized in CAD treatment plan Reviewed Importance of taking all medications as prescribed Reviewed Importance of attending all scheduled provider appointments Reviewed chest pain and need to call 911 if she has chest pain or shortness of breath. Reviewed cardiology follow up and medications changes.  Patient Self Care  Activities:  Attend all scheduled provider appointments Call pharmacy for medication refills 3-7 days in advance of running out of medications Call provider office for new concerns or questions  Notify RN Care Manager of TOC call rescheduling needs Participate in Transition of Care Program/Attend TOC scheduled calls Perform all self care activities independently  Take medications as prescribed   Call 911 for chest pain or shortness of breath  Plan:  Telephone follow up appointment with care management team member scheduled for:  09/010/2025  at 2:15pm The patient has been provided with contact information for the care management team and has been advised to call with any health related questions or concerns.         Patient verbalizes understanding of instructions and care plan provided today and agrees to view in MyChart. Active MyChart status and patient understanding of how to access instructions and care plan via MyChart confirmed with patient.     Telephone follow up appointment with care management team member scheduled for:   09/22/2023 at 215pm  Please call the care guide team at [phone] if you need to cancel or reschedule your appointment.   Please call the Suicide and Crisis Lifeline: 988 call the [place]  National Suicide Prevention Lifeline: (705)176-0570 or TTY: 419-736-3759 TTY (478)136-0004) to talk to a trained counselor call 1-800-273-TALK (toll free, 24 hour hotline) call 911 if you are experiencing a Mental Health or Behavioral Health Crisis or need someone to talk to.  Alan Ee, RN, BSN, [employer]- Transition of Care Team.  [facility] [phone]

## 2023-09-20 ENCOUNTER — Other Ambulatory Visit (HOSPITAL_COMMUNITY): Payer: Self-pay

## 2023-09-22 ENCOUNTER — Telehealth: Payer: Self-pay | Admitting: Internal Medicine

## 2023-09-22 ENCOUNTER — Other Ambulatory Visit: Payer: Self-pay

## 2023-09-22 NOTE — Telephone Encounter (Signed)
 Copied from CRM [id number]. Topic: Medical Record Request - Provider/Facility Request >> Sep 22, 2023  3:34 PM Tinnie C wrote: Reason for CRM: Joesph from [employer] calling to let us  know that she received chart notes, but nothing recent regarding pt's diabetes diagnosis. Can provider's addendum from 8/27 chart notes be added to these for insurance coverage. Please fax recent encounters related to diabetes to: [phone]

## 2023-09-22 NOTE — Transitions of Care (Post Inpatient/ED Visit) (Signed)
 Transition of Care week 4  Visit Note  09/22/2023  Name: Tasha Garrett MRN: [id number]          DOB: 1950/07/22  Situation: Patient enrolled in [facility] 30-day program. Visit completed with pt by telephone.   Background:   Initial Transition Care Management Follow-up Telephone Call    Past Medical History:  Diagnosis Date   Arthritis    Carotid artery occlusion    Chronic kidney disease    stage 3A   Coronary arteriosclerosis    Diabetes mellitus without complication (HCC)    type 2, no meds, does not check blood sugar per pt on 04/27/23   High cholesterol    Hypertension    Hypothyroid    Myocardial infarction [facility]) 2012   in [place]   Urinary tract infection     Assessment: Doing well. Denies chest pain. Denies any new problems or concerns today.  Patient Reported Symptoms: Cognitive Cognitive Status: Able to follow simple commands, Alert and oriented to person, place, and time, Normal speech and language skills      Neurological Neurological Review of Symptoms: No symptoms reported    HEENT HEENT Symptoms Reported: No symptoms reported      Cardiovascular   Weight: 156 lb (70.8 kg)  Respiratory Respiratory Symptoms Reported: No symptoms reported    Endocrine Endocrine Symptoms Reported: No symptoms reported Is patient diabetic?: No Endocrine Self-Management Outcome: 3 (uncertain)  Gastrointestinal Gastrointestinal Symptoms Reported: No symptoms reported      Genitourinary Genitourinary Symptoms Reported: No symptoms reported    Integumentary Integumentary Symptoms Reported: No symptoms reported    Musculoskeletal Musculoskelatal Symptoms Reviewed: No symptoms reported        Psychosocial Psychosocial Symptoms Reported: No symptoms reported         Vitals:   09/22/23 1419  BP: 130/60   Today's Vitals   09/22/23 1418 09/22/23 1419  BP:  130/60  Weight:  156 lb (70.8 kg)  PainSc: 2       Medications Reviewed Today     Reviewed by Rumalda Alan PENNER, RN (Registered Nurse) on 09/22/23 at 1416  Med List Status: <None>   Medication Order Taking? Sig Documenting Provider Last Dose Status Informant  amLODipine  (NORVASC ) 10 MG tablet [id number] Yes Take 1 tablet (10 mg total) by mouth daily. Theophilus Andrews, Tully GRADE, MD  Active   aspirin  EC 81 MG tablet [id number] Yes Take 81 mg by mouth in the morning. [provider]  Active Self, Child, Pharmacy Records  brimonidine  (ALPHAGAN ) 0.2 % ophthalmic solution [id number] Yes Place 1 drop into both eyes in the morning and at bedtime. [provider]  Active Self, Child, Pharmacy Records  clopidogrel  (PLAVIX ) 75 MG tablet [id number] Yes Take 1 tablet (75 mg total) by mouth daily. Theophilus Andrews, Tully GRADE, MD  Active   [facility] Liver Oil CAPS [id number] Yes Take 1 capsule by mouth in the morning. [provider]  Active Self, Child, Pharmacy Records  isosorbide  mononitrate (IMDUR ) 30 MG 24 hr tablet [id number] Yes Take 1 tablet (30 mg total) by mouth daily.  Patient taking differently: Take 60 mg by mouth daily.   Theophilus Andrews, Tully GRADE, MD  Active   latanoprost  (XALATAN ) 0.005 % ophthalmic solution [id number] Yes Place 1 drop into both eyes at bedtime.   Active Self, Child, Pharmacy Records  levothyroxine  (SYNTHROID ) 137 MCG tablet [id number] Yes Take 1 tablet (137 mcg total) by mouth daily. Theophilus Andrews, Tully GRADE, MD  Active  losartan  (COZAAR ) 100 MG tablet [id number] Yes Take 1 tablet (100 mg total) by mouth daily. Theophilus Andrews, Tully GRADE, MD  Active   metoprolol  tartrate (LOPRESSOR ) 25 MG tablet [id number] Yes Take 1 tablet (25 mg total) by mouth 2 (two) times daily.  Patient taking differently: Take 12.5 mg by mouth 2 (two) times daily.   Theophilus Andrews, Tully GRADE, MD  Active   Multiple Vitamin (MULTIVITAMIN WITH MINERALS) TABS tablet [id number] Yes Take 1 tablet by mouth in the morning. [provider]  Active Self, Child, Pharmacy Records  Polyethyl  Glyc-Propyl Glyc PF (SYSTANE HYDRATION PF) 0.4-0.3 % SOLN [id number] Yes Place 1 drop into both eyes daily as needed (dry, gritty eyes). [provider]  Active Self, Child, Pharmacy Records  rosuvastatin  (CRESTOR ) 40 MG tablet [id number] Yes Take 1 tablet (40 mg total) by mouth daily. Theophilus Andrews, Tully GRADE, MD  Active             Goals Addressed             This Visit's Progress    [facility] Transitions of Care [facility]) Care Plan       Problems:  Recent Hospitalization for treatment of chest pain , unstable angina- stent placement  09/07/2023  Denies any chest pain or shortness of breath. Reports I am doing fine. 09/15/2023  Patient reports that she has been feeling tired. Reports that she saw cardiology and that her metoprolol  was deceased to 12.5 mg twice a day. Patient reports that since then she has felt well.  NO chest pain and no shortness of breath. BP elevated today and reports that she is was rushing around this am when she checked her BP. Reports she will recheck later today. 09/22/2023  Patient reports that she is feeling well. Reports that she continues to have high BP and discussed with cardiology and that she was prescribed an increased in imdur .  Reports since them her Bp has been better. Denies any chest pain or shortness of breath.    Goal:  Over the next 30 days, the patient will not experience hospital readmission  Interventions:  Transitions of Care: Doctor Visits  - discussed the importance of doctor visits Assessed for chest pain- none Confirmed pateint has all her medications and knows how to take them- reviewed medications changes and and updated chart.  Reviewed when to call 911 Encouraged patient to weigh daily. Reviewed recent BP and weight   CAD Interventions: Assessed understanding of CAD diagnosis Medications reviewed including medications utilized in CAD treatment plan Reviewed Importance of taking all medications as prescribed Reviewed  Importance of attending all scheduled provider appointments Reviewed chest pain and need to call 911 if she has chest pain or shortness of breath. Reviewed cardiology follow up and medications changes.  Patient Self Care Activities:  Attend all scheduled provider appointments Call pharmacy for medication refills 3-7 days in advance of running out of medications Call provider office for new concerns or questions  Notify RN Care Manager of TOC call rescheduling needs Participate in Transition of Care Program/Attend TOC scheduled calls Perform all self care activities independently  Take medications as prescribed   Call 911 for chest pain or shortness of breath Continue to self monitor BP and weights.  Continue to follow low salt diet.   Plan:  Telephone follow up appointment with care management team member scheduled for:  09/30/2023  at 2:00pm The patient has been provided with contact information for the care management team and has  been advised to call with any health related questions or concerns.          Recommendation:   Continue Current Plan of Care  Follow Up Plan:   Telephone follow up appointment date/time:  09/30/2023 2pm  Alan Ee, RN, BSN, [facility] [employer]- Transition of Care Team.  [facility] [phone]

## 2023-09-22 NOTE — Patient Instructions (Signed)
 Visit Information  Thank you for taking time to visit with me today. Please don't hesitate to contact me if I can be of assistance to you before our next scheduled telephone appointment.  Our next appointment is by telephone on 09/30/2023 at 2pm  Following is a copy of your care plan:   Goals Addressed             This Visit's Progress    [facility] Transitions of Care (TOC) Care Plan       Problems:  Recent Hospitalization for treatment of chest pain , unstable angina- stent placement  09/07/2023  Denies any chest pain or shortness of breath. Reports I am doing fine. 09/15/2023  Patient reports that she has been feeling tired. Reports that she saw cardiology and that her metoprolol  was deceased to 12.5 mg twice a day. Patient reports that since then she has felt well.  NO chest pain and no shortness of breath. BP elevated today and reports that she is was rushing around this am when she checked her BP. Reports she will recheck later today. 09/22/2023  Patient reports that she is feeling well. Reports that she continues to have high BP and discussed with cardiology and that she was prescribed an increased in imdur .  Reports since them her Bp has been better. Denies any chest pain or shortness of breath.    Goal:  Over the next 30 days, the patient will not experience hospital readmission  Interventions:  Transitions of Care: Doctor Visits  - discussed the importance of doctor visits Assessed for chest pain- none Confirmed pateint has all her medications and knows how to take them- reviewed medications changes and and updated chart.  Reviewed when to call 911 Encouraged patient to weigh daily. Reviewed recent BP and weight   CAD Interventions: Assessed understanding of CAD diagnosis Medications reviewed including medications utilized in CAD treatment plan Reviewed Importance of taking all medications as prescribed Reviewed Importance of attending all scheduled provider  appointments Reviewed chest pain and need to call 911 if she has chest pain or shortness of breath. Reviewed cardiology follow up and medications changes.  Patient Self Care Activities:  Attend all scheduled provider appointments Call pharmacy for medication refills 3-7 days in advance of running out of medications Call provider office for new concerns or questions  Notify RN Care Manager of TOC call rescheduling needs Participate in Transition of Care Program/Attend TOC scheduled calls Perform all self care activities independently  Take medications as prescribed   Call 911 for chest pain or shortness of breath Continue to self monitor BP and weights.  Continue to follow low salt diet.   Plan:  Telephone follow up appointment with care management team member scheduled for:  09/30/2023  at 2:00pm The patient has been provided with contact information for the care management team and has been advised to call with any health related questions or concerns.         Patient verbalizes understanding of instructions and care plan provided today and agrees to view in MyChart. Active MyChart status and patient understanding of how to access instructions and care plan via MyChart confirmed with patient.     Telephone follow up appointment with care management team member scheduled for:  Please call the care guide team at [phone] if you need to cancel or reschedule your appointment.   Please call the Suicide and Crisis Lifeline: 988 call the [place]  National Suicide Prevention Lifeline: 629-368-8758 or TTY: (506)047-4429 TTY (605)251-1686) to  talk to a trained counselor call 1-800-273-TALK (toll free, 24 hour hotline) call 911 if you are experiencing a Mental Health or Behavioral Health Crisis or need someone to talk to.  Alan Ee, RN, BSN, CEN [employer]- Transition of Care Team.  [facility] [phone]

## 2023-09-28 ENCOUNTER — Ambulatory Visit: Payer: Self-pay

## 2023-09-28 NOTE — Telephone Encounter (Signed)
 noted

## 2023-09-28 NOTE — Telephone Encounter (Signed)
 FYI Only or Action Required?: FYI only for provider.  Patient was last seen in primary care on 09/08/2023 by Theophilus Andrews, Tully GRADE, MD.  Called Nurse Triage reporting Hypoglycemia.  Symptoms began about a month ago.  Interventions attempted: Rest, hydration, or home remedies.  Symptoms are: stable.  Triage Disposition: Call PCP Within 24 Hours  Patient/caregiver understands and will follow disposition?: Yes                             Copied from CRM #[id number]. Topic: Clinical - Red Word Triage >> Sep 28, 2023  3:10 PM Mesmerise C wrote: [facility] that prompted transfer to Nurse Triage: Patient stated she's been having low blood sugar in the 60s every other day in the afternoon Reason for Disposition  [1] Blood glucose 70 mg/dL (3.9 mmol/L) or below OR symptomatic, now improved with Care Advice AND [2] cause unknown  Answer Assessment - Initial Assessment Questions 1. SYMPTOMS: What symptoms are you concerned about?     Low blood sugar 2. ONSET:  When did the symptoms start?     A month ago  3. BLOOD GLUCOSE: What is your blood glucose level?      66 this morning, 69 this afternoon 4. USUAL RANGE: What is your blood glucose level usually? (e.g., usual fasting morning value, usual evening value)     150-200 5. TYPE 1 or 2:  Do you know what type of diabetes you have?  (e.g., Type 1, Type 2, Gestational; doesn't know)      Denies 6. INSULIN : Do you take insulin ? What type of insulin (s) do you use? What is the mode of delivery? (syringe, pen; injection or pump) When did you last give yourself an insulin  dose? (i.e., time or hours/minutes ago) How much did you give? (i.e., how many units)     Denies 7. DIABETES PILLS: Do you take any pills for your diabetes? If Yes, ask: What is the name of the medicine(s) that you take for high blood sugar?     Denies 8. OTHER SYMPTOMS: Do you have any symptoms? (e.g., fever, frequent urination,  difficulty breathing, vomiting)     Denies symptoms at this time including dizziness and jitteriness 9. LOW BLOOD GLUCOSE TREATMENT: What have you done so far to treat the low blood glucose level?     Ate a piece of peppermint and piece of chocolate 10. FOOD: When did you last eat or drink?       10:30 AM this morning 11. ALONE: Are you alone right now or is someone with you?        Alone    Patient stated she is a not a diabetic and is not taking medications. Patient stated she was hospitalized a month ago and has been monitoring blood sugar since then.  Protocols used: Diabetes - Low Blood Sugar-A-AH

## 2023-09-29 ENCOUNTER — Ambulatory Visit: Admitting: Internal Medicine

## 2023-09-29 ENCOUNTER — Encounter: Payer: Self-pay | Admitting: Internal Medicine

## 2023-09-29 VITALS — BP 124/64 | HR 73 | Temp 98.2°F | Wt 155.0 lb

## 2023-09-29 DIAGNOSIS — E1159 Type 2 diabetes mellitus with other circulatory complications: Secondary | ICD-10-CM | POA: Diagnosis not present

## 2023-09-29 DIAGNOSIS — Z23 Encounter for immunization: Secondary | ICD-10-CM | POA: Diagnosis not present

## 2023-09-29 NOTE — Telephone Encounter (Signed)
Office note faxed and confirmed 

## 2023-09-29 NOTE — Addendum Note (Signed)
 Addended by: KATHRYNE MILLMAN B on: 09/29/2023 04:11 PM   Modules accepted: Orders

## 2023-09-29 NOTE — Progress Notes (Signed)
 Established Patient Office Visit     CC/Reason for Visit: Low blood sugar levels, flu vaccine  HPI: Tasha Garrett is a 73 y.o. female who is coming in today for the above mentioned reasons. Past Medical History is significant for: Type 2 diabetes not on medication.  Her daughter scheduled this appointment due to concerns with low blood sugars.  Patient is wearing a freestyle libre.  I have reviewed her measurements and she has had 0 hypoglycemic events over the last 7 days and has been 88% of time within range.  Requesting a flu vaccine.   Past Medical/Surgical History: Past Medical History:  Diagnosis Date   Arthritis    Carotid artery occlusion    Chronic kidney disease    stage 3A   Coronary arteriosclerosis    Diabetes mellitus without complication (HCC)    type 2, no meds, does not check blood sugar per pt on 04/27/23   High cholesterol    Hypertension    Hypothyroid    Myocardial infarction [facility]) 2012   in [place]   Urinary tract infection     Past Surgical History:  Procedure Laterality Date   ABDOMINAL HYSTERECTOMY     COLONOSCOPY     CORONARY ARTERY BYPASS GRAFT  05/13/2011   CABG x3: LIMA-mid LAD, SVG-OM, SVG-RCA ([facility])   CORONARY LITHOTRIPSY N/A 08/26/2023   Procedure: CORONARY LITHOTRIPSY;  Surgeon: Swaziland, Peter M, MD;  Location: [facility] INVASIVE CV LAB;  Service: Cardiovascular;  Laterality: N/A;   CORONARY STENT INTERVENTION N/A 08/26/2023   Procedure: CORONARY STENT INTERVENTION;  Surgeon: Swaziland, Peter M, MD;  Location: [facility] INVASIVE CV LAB;  Service: Cardiovascular;  Laterality: N/A;   CORONARY ULTRASOUND/IVUS N/A 08/26/2023   Procedure: Coronary Ultrasound/IVUS;  Surgeon: Swaziland, Peter M, MD;  Location: [facility] INVASIVE CV LAB;  Service: Cardiovascular;  Laterality: N/A;   EYE SURGERY Bilateral    cataracts removed   LEFT HEART CATH AND CORONARY ANGIOGRAPHY N/A 08/25/2023   Procedure: LEFT HEART CATH AND CORONARY ANGIOGRAPHY;  Surgeon: Swaziland, Peter M,  MD;  Location: [facility] INVASIVE CV LAB;  Service: Cardiovascular;  Laterality: N/A;   TRANSCAROTID ARTERY REVASCULARIZATION  Left 05/05/2023   Procedure: LEFT TRANSCAROTID ARTERY REVASCULARIZATION (TCAR);  Surgeon: Gretta Lonni PARAS, MD;  Location[facility] OR;  Service: Vascular;  Laterality: Left;    Social History:  reports that she quit smoking about 13 years ago. Her smoking use included cigarettes. She started smoking about 33 years ago. She has a 10 pack-year smoking history. She has never used smokeless tobacco. She reports that she does not currently use alcohol. She reports that she does not currently use drugs.  Allergies: No Known Allergies  Family History:  Family History  Problem Relation Age of Onset   ALS Mother      Current Outpatient Medications:    amLODipine  (NORVASC ) 10 MG tablet, Take 1 tablet (10 mg total) by mouth daily., Disp: 90 tablet, Rfl: 1   aspirin  EC 81 MG tablet, Take 81 mg by mouth in the morning., Disp: , Rfl:    brimonidine  (ALPHAGAN ) 0.2 % ophthalmic solution, Place 1 drop into both eyes in the morning and at bedtime., Disp: , Rfl:    clopidogrel  (PLAVIX ) 75 MG tablet, Take 1 tablet (75 mg total) by mouth daily., Disp: 90 tablet, Rfl: 1   Cod Liver Oil CAPS, Take 1 capsule by mouth in the morning., Disp: , Rfl:    isosorbide  mononitrate (IMDUR ) 30 MG 24 hr tablet, Take  1 tablet (30 mg total) by mouth daily. (Patient taking differently: Take 60 mg by mouth daily.), Disp: 90 tablet, Rfl: 1   latanoprost  (XALATAN ) 0.005 % ophthalmic solution, Place 1 drop into both eyes at bedtime., Disp: 7.5 mL, Rfl: 4   levothyroxine  (SYNTHROID ) 137 MCG tablet, Take 1 tablet (137 mcg total) by mouth daily., Disp: 90 tablet, Rfl: 1   losartan  (COZAAR ) 100 MG tablet, Take 1 tablet (100 mg total) by mouth daily., Disp: 90 tablet, Rfl: 1   metoprolol  tartrate (LOPRESSOR ) 25 MG tablet, Take 1 tablet (25 mg total) by mouth 2 (two) times daily. (Patient taking differently: Take 12.5  mg by mouth 2 (two) times daily.), Disp: 180 tablet, Rfl: 1   Multiple Vitamin (MULTIVITAMIN WITH MINERALS) TABS tablet, Take 1 tablet by mouth in the morning., Disp: , Rfl:    Polyethyl Glyc-Propyl Glyc PF (SYSTANE HYDRATION PF) 0.4-0.3 % SOLN, Place 1 drop into both eyes daily as needed (dry, gritty eyes)., Disp: , Rfl:    rosuvastatin  (CRESTOR ) 40 MG tablet, Take 1 tablet (40 mg total) by mouth daily., Disp: 90 tablet, Rfl: 1  Review of Systems:  Negative unless indicated in HPI.   Physical Exam: Vitals:   09/29/23 1456  BP: 124/64  Pulse: 73  Temp: 98.2 F (36.8 C)  TempSrc: Oral  SpO2: 97%  Weight: 155 lb (70.3 kg)    Body mass index is 29.29 kg/m.    Impression and Plan:  Type 2 diabetes mellitus with other circulatory complication, without long-term current use of insulin  (HCC)  Immunization due   - After reviewing freestyle libre information, no hypoglycemic events have been reported recently. - Flu vaccine administered in office today.  Time spent:20 minutes reviewing chart, interviewing and examining patient and formulating plan of care.     Tully Theophilus Andrews, MD [facility]

## 2023-09-30 ENCOUNTER — Other Ambulatory Visit: Payer: Self-pay

## 2023-09-30 IMAGING — DX DG CHEST 2V
2 series · 2 of 2 positions shown · non-contrast
Comparison: None.

CLINICAL DATA: Chronic cough, history of tobacco abuse

EXAM:
CHEST - 2 VIEW

[chest pa]
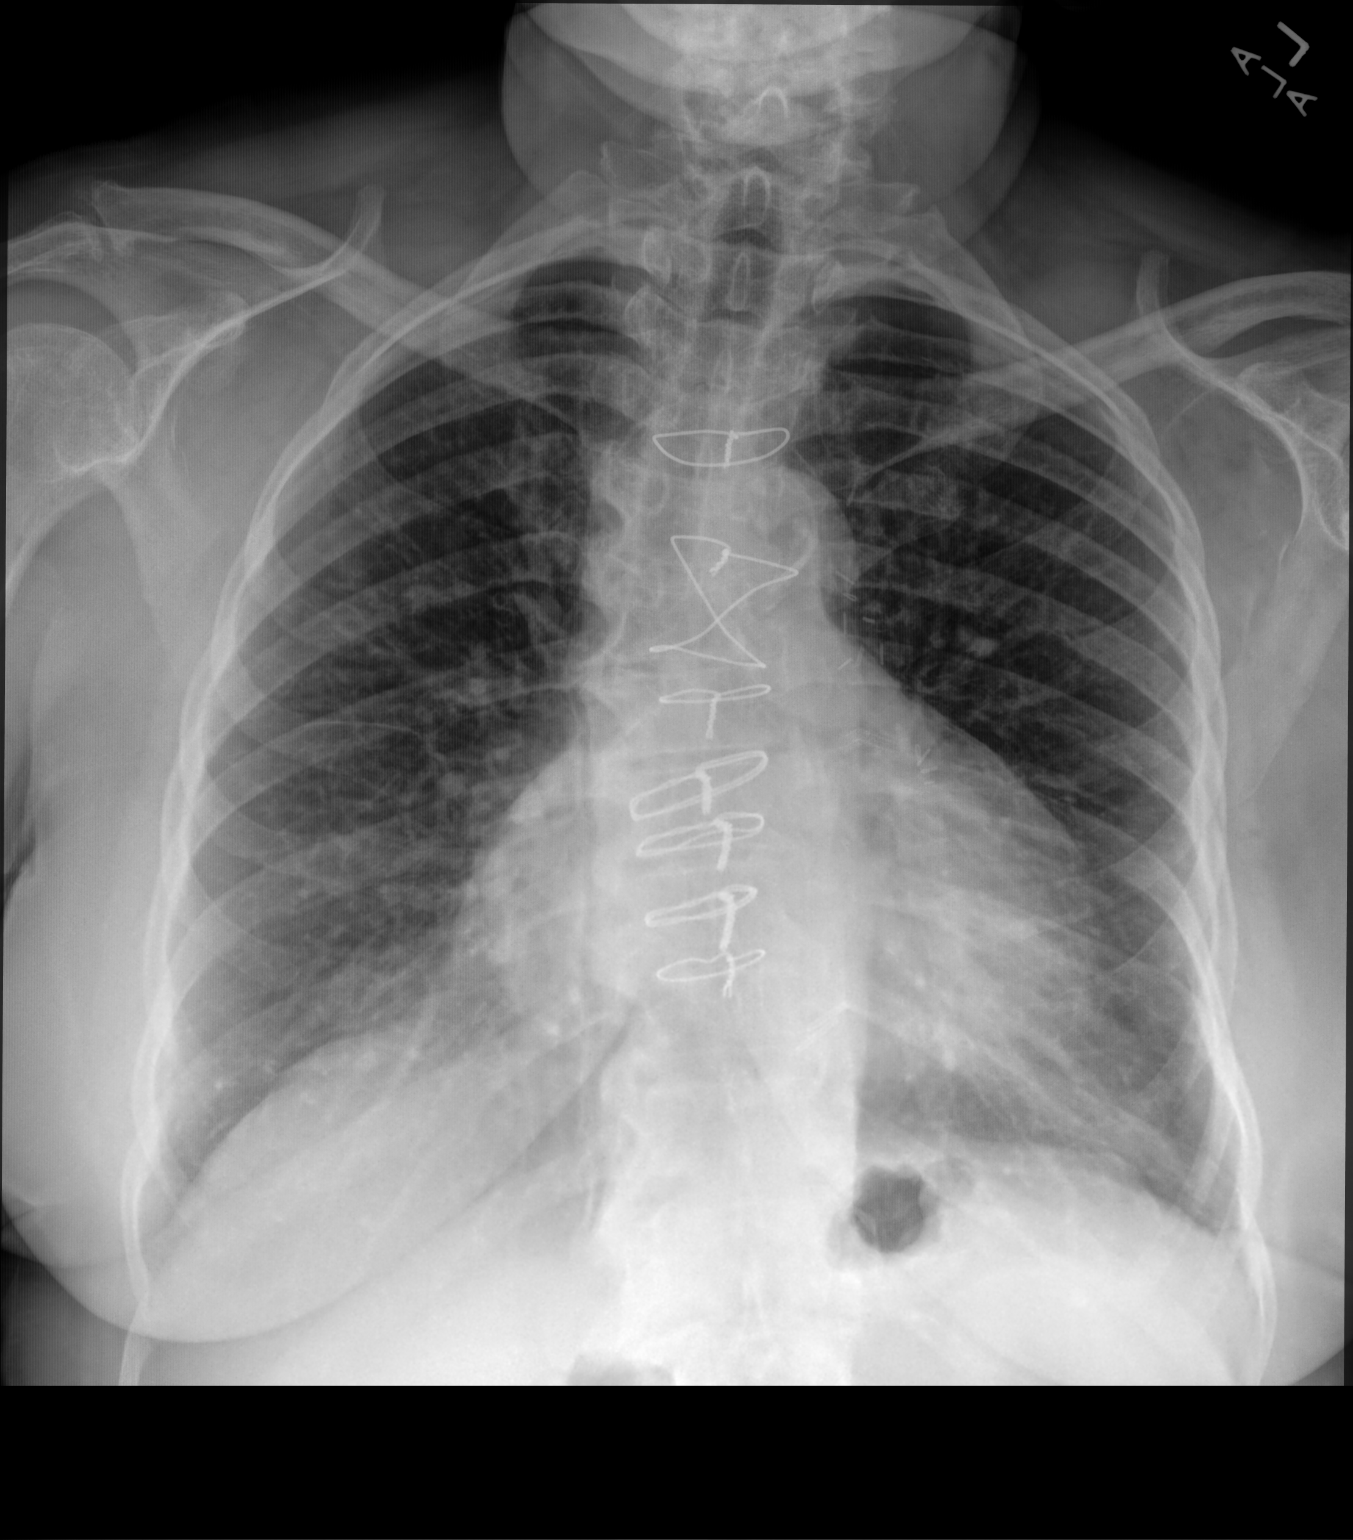

[chest lat]
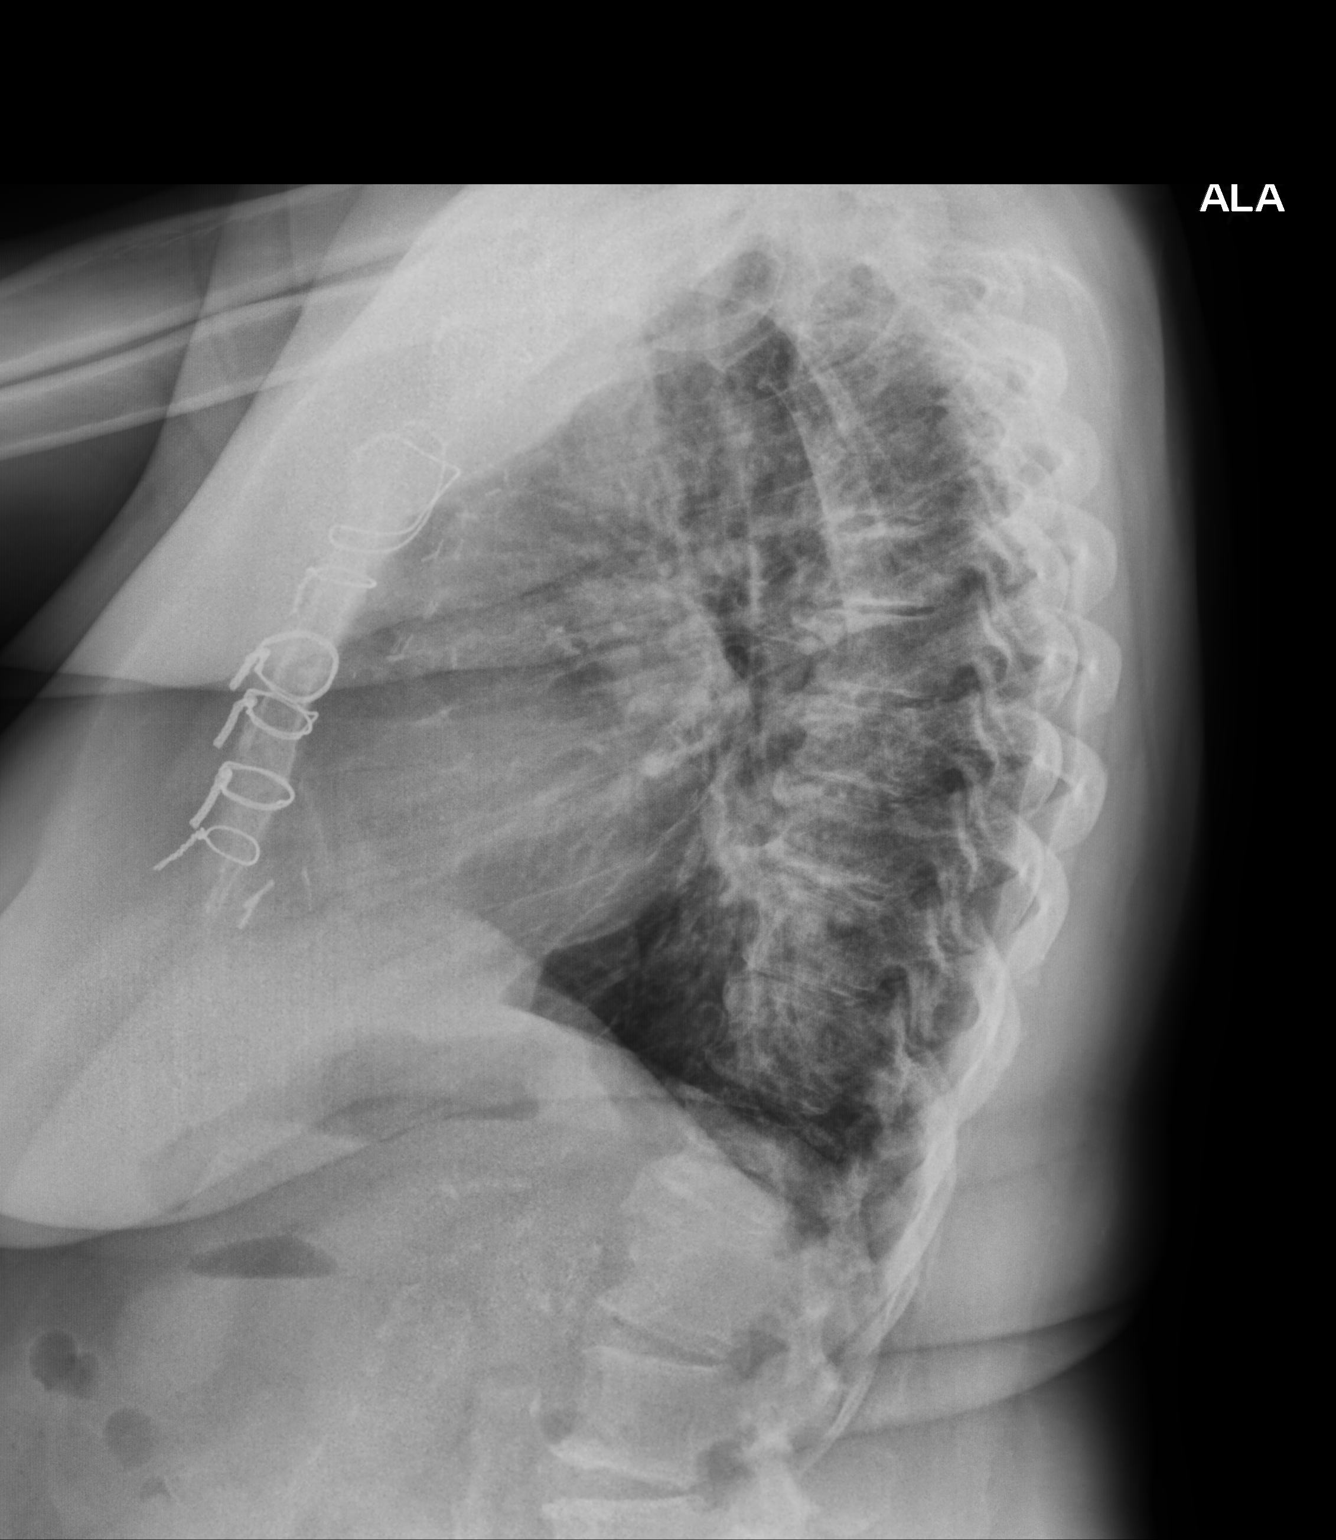

[2 of 2 positions shown; findings below may reference images not displayed]

FINDINGS: Frontal and lateral views of the chest demonstrate postsurgical
changes from median sternotomy. Mild enlargement of the cardiac
silhouette. No acute airspace disease, effusion, or pneumothorax. No
acute bony abnormalities.
IMPRESSION: 1. Mild cardiomegaly.
2. No acute airspace disease.

## 2023-09-30 NOTE — Patient Instructions (Signed)
 Visit Information  Thank you for taking time to visit with me today.   Following is a copy of your care plan:   Goals Addressed             This Visit's Progress    COMPLETED: [facility] Transitions of Care (TOC) Care Plan       Problems:  Recent Hospitalization for treatment of chest pain , unstable angina- stent placement  09/07/2023  Denies any chest pain or shortness of breath. Reports I am doing fine. 09/15/2023  Patient reports that she has been feeling tired. Reports that she saw cardiology and that her metoprolol  was deceased to 12.5 mg twice a day. Patient reports that since then she has felt well.  NO chest pain and no shortness of breath. BP elevated today and reports that she is was rushing around this am when she checked her BP. Reports she will recheck later today. 09/22/2023  Patient reports that she is feeling well. Reports that she continues to have high BP and discussed with cardiology and that she was prescribed an increased in imdur .  Reports since them her Bp has been better. Denies any chest pain or shortness of breath. 09/30/2023  Reports no chest pain. BP doing ok.    Goal:  Over the next 30 days, the patient will not experience hospital readmission  Interventions:  Transitions of Care: Doctor Visits  - discussed the importance of doctor visits Assessed for chest pain- none Confirmed pateint has all her medications and knows how to take them- reviewed medications changes and and updated chart.  Reviewed when to call 911 Encouraged patient to weigh daily. Reviewed recent BP and weight Reviewed with patient that she has completed 30 day TOC program.  Patient offered follow up with CCM and declines. Encouraged patient to continue to follow up with PCP for any concerns.    CAD Interventions: Assessed understanding of CAD diagnosis Medications reviewed including medications utilized in CAD treatment plan Reviewed Importance of taking all medications as  prescribed Reviewed Importance of attending all scheduled provider appointments Reviewed chest pain and need to call 911 if she has chest pain or shortness of breath. Reviewed cardiology follow up and medications changes.  Patient Self Care Activities:  Attend all scheduled provider appointments Call pharmacy for medication refills 3-7 days in advance of running out of medications Call provider office for new concerns or questions  Perform all self care activities independently  Take medications as prescribed   Call 911 for chest pain or shortness of breath Continue to self monitor BP and weights.  Continue to follow low salt diet.   Plan:  Completed TOC program and has declined CCM follow up .         Patient verbalizes understanding of instructions and care plan provided today and agrees to view in MyChart. Active MyChart status and patient understanding of how to access instructions and care plan via MyChart confirmed with patient.     Call MD for changes in condition.  Please call the care guide team at [phone] if you need to cancel or reschedule your appointment.   Please call the Suicide and Crisis Lifeline: 988 call the [place]  National Suicide Prevention Lifeline: 331-276-0286 or TTY: (717)023-6661 TTY 531-627-3890) to talk to a trained counselor call 1-800-273-TALK (toll free, 24 hour hotline) call 911 if you are experiencing a Mental Health or Behavioral Health Crisis or need someone to talk to.  Alan Ee, RN, BSN, CEN Population Health- Transition of Care  Team.  [facility] [phone]

## 2023-09-30 NOTE — Transitions of Care (Post Inpatient/ED Visit) (Signed)
 Transition of Care week 5  Visit Note  09/30/2023  Name: Tasha Garrett MRN: [id number]          DOB: 04/08/1950  Situation: Patient enrolled in [facility] 30-day program. Visit completed with patient by telephone.   Background:   Initial Transition Care Management Follow-up Telephone Call    Past Medical History:  Diagnosis Date   Arthritis    Carotid artery occlusion    Chronic kidney disease    stage 3A   Coronary arteriosclerosis    Diabetes mellitus without complication (HCC)    type 2, no meds, does not check blood sugar per pt on 04/27/23   High cholesterol    Hypertension    Hypothyroid    Myocardial infarction [facility]) 2012   in [place]   Urinary tract infection     Assessment: Patient reports that she is doing really well. No new concerns identified today.  Patient Reported Symptoms: Cognitive Cognitive Status: Able to follow simple commands, Alert and oriented to person, place, and time, Normal speech and language skills      Neurological Neurological Review of Symptoms: No symptoms reported    HEENT HEENT Symptoms Reported: No symptoms reported      Cardiovascular Cardiovascular Symptoms Reported: Other: Other Cardiovascular Symptoms: denies chest pain, denies SOB, denies any swelling. Weight unchanged. Does patient have uncontrolled Hypertension?: No Cardiovascular Management Strategies: Medication therapy Weight: 155 lb (70.3 kg)  Respiratory Respiratory Symptoms Reported: No symptoms reported    Endocrine Endocrine Symptoms Reported: No symptoms reported Is patient diabetic?: Yes Is patient checking blood sugars at home?: Yes List most recent blood sugar readings, include date and time of day: 92 this am Endocrine Self-Management Outcome: 5 (very good)  Gastrointestinal Gastrointestinal Symptoms Reported: No symptoms reported      Genitourinary Genitourinary Symptoms Reported: No symptoms reported    Integumentary Integumentary Symptoms Reported: No  symptoms reported    Musculoskeletal Musculoskelatal Symptoms Reviewed: No symptoms reported        Psychosocial Psychosocial Symptoms Reported: No symptoms reported         Vitals:   09/30/23 1407  Pulse: 67    Medications Reviewed Today     Reviewed by Rumalda Alan PENNER, RN (Registered Nurse) on 09/30/23 at 1401  Med List Status: <None>   Medication Order Taking? Sig Documenting Provider Last Dose Status Informant  amLODipine  (NORVASC ) 10 MG tablet [id number]  Take 1 tablet (10 mg total) by mouth daily. Theophilus Andrews, Tully GRADE, MD  Active   aspirin  EC 81 MG tablet [id number]  Take 81 mg by mouth in the morning. [provider]  Active Self, Child, Pharmacy Records  brimonidine  (ALPHAGAN ) 0.2 % ophthalmic solution [id number]  Place 1 drop into both eyes in the morning and at bedtime. [provider]  Active Self, Child, Pharmacy Records  clopidogrel  (PLAVIX ) 75 MG tablet [id number]  Take 1 tablet (75 mg total) by mouth daily. Theophilus Andrews, Tully GRADE, MD  Active   [facility] Liver Oil CAPS [id number]  Take 1 capsule by mouth in the morning. [provider]  Active Self, Child, Pharmacy Records  isosorbide  mononitrate (IMDUR ) 30 MG 24 hr tablet [id number]  Take 1 tablet (30 mg total) by mouth daily.  Patient taking differently: Take 60 mg by mouth daily.   Theophilus Andrews, Tully GRADE, MD  Active   latanoprost  (XALATAN ) 0.005 % ophthalmic solution [id number]  Place 1 drop into both eyes at bedtime.   Active Self, Child, Pharmacy Records  levothyroxine  (  SYNTHROID ) 137 MCG tablet [id number]  Take 1 tablet (137 mcg total) by mouth daily. Theophilus Andrews, Tully GRADE, MD  Active   losartan  (COZAAR ) 100 MG tablet [id number]  Take 1 tablet (100 mg total) by mouth daily. Theophilus Andrews, Tully GRADE, MD  Active   metoprolol  tartrate (LOPRESSOR ) 25 MG tablet [id number]  Take 1 tablet (25 mg total) by mouth 2 (two) times daily.  Patient taking differently: Take 12.5 mg by mouth 2 (two)  times daily.   Theophilus Andrews, Tully GRADE, MD  Active   Multiple Vitamin (MULTIVITAMIN WITH MINERALS) TABS tablet [id number]  Take 1 tablet by mouth in the morning. [provider]  Active Self, Child, Pharmacy Records  Polyethyl Glyc-Propyl Glyc PF (SYSTANE HYDRATION PF) 0.4-0.3 % SOLN [id number]  Place 1 drop into both eyes daily as needed (dry, gritty eyes). [provider]  Active Self, Child, Pharmacy Records  rosuvastatin  (CRESTOR ) 40 MG tablet [id number]  Take 1 tablet (40 mg total) by mouth daily. Theophilus Andrews, Tully GRADE, MD  Active               Goals Addressed             This Visit's Progress    COMPLETED: [facility]I Transitions of Care (TOC) Care Plan       Problems:  Recent Hospitalization for treatment of chest pain , unstable angina- stent placement  09/07/2023  Denies any chest pain or shortness of breath. Reports I am doing fine. 09/15/2023  Patient reports that she has been feeling tired. Reports that she saw cardiology and that her metoprolol  was deceased to 12.5 mg twice a day. Patient reports that since then she has felt well.  NO chest pain and no shortness of breath. BP elevated today and reports that she is was rushing around this am when she checked her BP. Reports she will recheck later today. 09/22/2023  Patient reports that she is feeling well. Reports that she continues to have high BP and discussed with cardiology and that she was prescribed an increased in imdur .  Reports since them her Bp has been better. Denies any chest pain or shortness of breath. 09/30/2023  Reports no chest pain. BP doing ok.    Goal:  Over the next 30 days, the patient will not experience hospital readmission  Interventions:  Transitions of Care: Doctor Visits  - discussed the importance of doctor visits Assessed for chest pain- none Confirmed pateint has all her medications and knows how to take them- reviewed medications changes and and updated chart.  Reviewed when  to call 911 Encouraged patient to weigh daily. Reviewed recent BP and weight Reviewed with patient that she has completed 30 day TOC program.  Patient offered follow up with CCM and declines. Encouraged patient to continue to follow up with PCP for any concerns.    CAD Interventions: Assessed understanding of CAD diagnosis Medications reviewed including medications utilized in CAD treatment plan Reviewed Importance of taking all medications as prescribed Reviewed Importance of attending all scheduled provider appointments Reviewed chest pain and need to call 911 if she has chest pain or shortness of breath. Reviewed cardiology follow up and medications changes.  Patient Self Care Activities:  Attend all scheduled provider appointments Call pharmacy for medication refills 3-7 days in advance of running out of medications Call provider office for new concerns or questions  Perform all self care activities independently  Take medications as prescribed   Call 911 for chest pain or shortness  of breath Continue to self monitor BP and weights.  Continue to follow low salt diet.   Plan:  Completed TOC program and has declined CCM follow up .          Recommendation:   Continue Current Plan of Care  Follow Up Plan:   Closing From:  Transitions of Care Program  Alan Ee, RN, BSN, [employer]- Transition of Care Team.  [facility] [phone]

## 2023-10-01 DIAGNOSIS — E1159 Type 2 diabetes mellitus with other circulatory complications: Secondary | ICD-10-CM | POA: Diagnosis not present

## 2023-10-01 DIAGNOSIS — E1122 Type 2 diabetes mellitus with diabetic chronic kidney disease: Secondary | ICD-10-CM | POA: Diagnosis not present

## 2023-10-06 DIAGNOSIS — E785 Hyperlipidemia, unspecified: Secondary | ICD-10-CM | POA: Diagnosis not present

## 2023-10-06 DIAGNOSIS — N183 Chronic kidney disease, stage 3 unspecified: Secondary | ICD-10-CM | POA: Diagnosis not present

## 2023-10-06 DIAGNOSIS — I10 Essential (primary) hypertension: Secondary | ICD-10-CM | POA: Diagnosis not present

## 2023-10-06 DIAGNOSIS — Z6829 Body mass index (BMI) 29.0-29.9, adult: Secondary | ICD-10-CM | POA: Diagnosis not present

## 2023-10-06 DIAGNOSIS — R001 Bradycardia, unspecified: Secondary | ICD-10-CM | POA: Diagnosis not present

## 2023-10-06 DIAGNOSIS — I2510 Atherosclerotic heart disease of native coronary artery without angina pectoris: Secondary | ICD-10-CM | POA: Diagnosis not present

## 2023-10-06 DIAGNOSIS — I6529 Occlusion and stenosis of unspecified carotid artery: Secondary | ICD-10-CM | POA: Diagnosis not present

## 2023-10-06 DIAGNOSIS — Z87891 Personal history of nicotine dependence: Secondary | ICD-10-CM | POA: Diagnosis not present

## 2023-10-13 ENCOUNTER — Ambulatory Visit: Payer: Self-pay

## 2023-10-13 NOTE — Telephone Encounter (Signed)
 FYI Only or Action Required?: FYI only for provider.  Patient was last seen in primary care on 09/29/2023 by Theophilus Andrews, Tully GRADE, MD.  Called Nurse Triage reporting Blood Sugar Problem.  Symptoms began today.  Interventions attempted: Rest, hydration, or home remedies.  Symptoms are: stable.  Triage Disposition: Home Care  Patient/caregiver understands and will follow disposition?: Yesransfer to Nurse Triage: blood sugar 252 Reason for Disposition  [1] Blood glucose 240 - 300 mg/dL (86.6 - 83.2 mmol/L) AND [2] does not use insulin  (e.g., not insulin -dependent; most people with type 2 diabetes)  Answer Assessment - Initial Assessment Questions Usually normal until after eating, no more than 252. Patient ate at 10 am, breaded chicken tenders, yams and broccoli. Patient states just eats whatever, educated patient on proper food consumption for diabetes management, eating an excess of carbs will cause sugar to rise and when to seek emergency care.  1. BLOOD GLUCOSE: What is your blood glucose level?      252  2. ONSET: When did you check the blood glucose?     30-40 minutes ago  3. USUAL RANGE: What is your glucose level usually? (e.g., usual fasting morning value, usual evening value)      Unsure, notices high spikes after eating  5. TYPE 1 or 2:  Do you know what type of diabetes you have?  (e.g., Type 1, Type 2, Gestational; doesn't know)      Type 2  6. INSULIN : Do you take insulin ? What type of insulin (s) do you use? What is the mode of delivery? (syringe, pen; injection or pump)?      No  7. DIABETES PILLS: Do you take any pills for your diabetes? If Yes, ask: Have you missed taking any pills recently?     No  8. OTHER SYMPTOMS: Do you have any symptoms? (e.g., fever, frequent urination, difficulty breathing, dizziness, weakness, vomiting)     No  Protocols used: Diabetes - High Blood Sugar-A-AH  Copied from CRM #[id number]. Topic: Clinical - Red  Word Triage >> Oct 13, 2023 12:18 PM Shardie S wrote: [facility] that prompted transfer to Nurse Triage: blood sugar 252

## 2023-10-18 ENCOUNTER — Encounter: Payer: Self-pay | Admitting: Internal Medicine

## 2023-10-21 ENCOUNTER — Ambulatory Visit (INDEPENDENT_AMBULATORY_CARE_PROVIDER_SITE_OTHER): Admitting: Internal Medicine

## 2023-10-21 ENCOUNTER — Encounter: Payer: Self-pay | Admitting: Internal Medicine

## 2023-10-21 ENCOUNTER — Other Ambulatory Visit (HOSPITAL_COMMUNITY): Payer: Self-pay

## 2023-10-21 ENCOUNTER — Encounter: Payer: Self-pay | Admitting: Pharmacist

## 2023-10-21 ENCOUNTER — Other Ambulatory Visit: Payer: Self-pay

## 2023-10-21 ENCOUNTER — Other Ambulatory Visit (HOSPITAL_BASED_OUTPATIENT_CLINIC_OR_DEPARTMENT_OTHER): Payer: Self-pay

## 2023-10-21 VITALS — BP 148/70 | HR 60 | Temp 98.2°F | Wt 154.1 lb

## 2023-10-21 DIAGNOSIS — I2510 Atherosclerotic heart disease of native coronary artery without angina pectoris: Secondary | ICD-10-CM | POA: Diagnosis not present

## 2023-10-21 DIAGNOSIS — R809 Proteinuria, unspecified: Secondary | ICD-10-CM | POA: Diagnosis not present

## 2023-10-21 DIAGNOSIS — N1831 Chronic kidney disease, stage 3a: Secondary | ICD-10-CM

## 2023-10-21 DIAGNOSIS — I10 Essential (primary) hypertension: Secondary | ICD-10-CM | POA: Diagnosis not present

## 2023-10-21 DIAGNOSIS — E785 Hyperlipidemia, unspecified: Secondary | ICD-10-CM | POA: Diagnosis not present

## 2023-10-21 DIAGNOSIS — E1129 Type 2 diabetes mellitus with other diabetic kidney complication: Secondary | ICD-10-CM

## 2023-10-21 DIAGNOSIS — E1159 Type 2 diabetes mellitus with other circulatory complications: Secondary | ICD-10-CM | POA: Diagnosis not present

## 2023-10-21 DIAGNOSIS — Z7984 Long term (current) use of oral hypoglycemic drugs: Secondary | ICD-10-CM

## 2023-10-21 LAB — POCT GLYCOSYLATED HEMOGLOBIN (HGB A1C): Hemoglobin A1C: 6.4 % — AB (ref 4.0–5.6)

## 2023-10-21 MED ORDER — EMPAGLIFLOZIN 10 MG PO TABS
10.0000 mg | ORAL_TABLET | Freq: Every day | ORAL | 1 refills | Status: DC
  Filled 2023-10-21: qty 90, 90d supply, fill #0

## 2023-10-21 MED ORDER — EMPAGLIFLOZIN 10 MG PO TABS
10.0000 mg | ORAL_TABLET | Freq: Every day | ORAL | 1 refills | Status: DC
  Filled 2023-10-21: qty 90, 90d supply, fill #0
  Filled 2023-10-26: qty 30, 30d supply, fill #0

## 2023-10-21 NOTE — Progress Notes (Signed)
 Established Patient Office Visit     CC/Reason for Visit: Follow-up chronic medical conditions  HPI: Tasha Garrett is a 73 y.o. female who is coming in today for the above mentioned reasons. Past Medical History is significant for: Hypertension, hyperlipidemia, type 2 diabetes, stage IIIa chronic kidney disease, hypothyroidism, coronary artery disease and carotid artery disease status post carotid endarterectomy.  She is here today over concerns about her blood sugar.  She is now wearing a freestyle libre.  She feels like she will have frequent highs.  Review of her device shows that she is in range 77% of the time and above 250 3% of the time.  Her A1c is 6.4.  She is not on any diabetic medication.  Her blood pressure is elevated but she is following with cardiology recently made adjustments.   Past Medical/Surgical History: Past Medical History:  Diagnosis Date   Arthritis    Carotid artery occlusion    Chronic kidney disease    stage 3A   Coronary arteriosclerosis    Diabetes mellitus without complication (HCC)    type 2, no meds, does not check blood sugar per pt on 04/27/23   High cholesterol    Hypertension    Hypothyroid    Myocardial infarction [facility]) 2012   in [place]   Urinary tract infection     Past Surgical History:  Procedure Laterality Date   ABDOMINAL HYSTERECTOMY     COLONOSCOPY     CORONARY ARTERY BYPASS GRAFT  05/13/2011   CABG x3: LIMA-mid LAD, SVG-OM, SVG-RCA ([facility])   CORONARY LITHOTRIPSY N/A 08/26/2023   Procedure: CORONARY LITHOTRIPSY;  Surgeon: Swaziland, Peter M, MD;  Location: [facility] INVASIVE CV LAB;  Service: Cardiovascular;  Laterality: N/A;   CORONARY STENT INTERVENTION N/A 08/26/2023   Procedure: CORONARY STENT INTERVENTION;  Surgeon: Swaziland, Peter M, MD;  Location: [facility] INVASIVE CV LAB;  Service: Cardiovascular;  Laterality: N/A;   CORONARY ULTRASOUND/IVUS N/A 08/26/2023   Procedure: Coronary Ultrasound/IVUS;  Surgeon: Swaziland, Peter M, MD;   Location: [facility] INVASIVE CV LAB;  Service: Cardiovascular;  Laterality: N/A;   EYE SURGERY Bilateral    cataracts removed   LEFT HEART CATH AND CORONARY ANGIOGRAPHY N/A 08/25/2023   Procedure: LEFT HEART CATH AND CORONARY ANGIOGRAPHY;  Surgeon: Swaziland, Peter M, MD;  Location: [facility] INVASIVE CV LAB;  Service: Cardiovascular;  Laterality: N/A;   TRANSCAROTID ARTERY REVASCULARIZATION  Left 05/05/2023   Procedure: LEFT TRANSCAROTID ARTERY REVASCULARIZATION (TCAR);  Surgeon: Gretta Lonni PARAS, MD;  Location: [facility] OR;  Service: Vascular;  Laterality: Left;    Social History:  reports that she quit smoking about 13 years ago. Her smoking use included cigarettes. She started smoking about 33 years ago. She has a 10 pack-year smoking history. She has never used smokeless tobacco. She reports that she does not currently use alcohol. She reports that she does not currently use drugs.  Allergies: No Known Allergies  Family History:  Family History  Problem Relation Age of Onset   ALS Mother      Current Outpatient Medications:    amLODipine  (NORVASC ) 10 MG tablet, Take 1 tablet (10 mg total) by mouth daily., Disp: 90 tablet, Rfl: 1   aspirin  EC 81 MG tablet, Take 81 mg by mouth in the morning., Disp: , Rfl:    brimonidine  (ALPHAGAN ) 0.2 % ophthalmic solution, Place 1 drop into both eyes in the morning and at bedtime., Disp: , Rfl:    clopidogrel  (PLAVIX ) 75 MG tablet, Take 1  tablet (75 mg total) by mouth daily., Disp: 90 tablet, Rfl: 1   Cod Liver Oil CAPS, Take 1 capsule by mouth in the morning., Disp: , Rfl:    empagliflozin (JARDIANCE) 10 MG TABS tablet, Take 1 tablet (10 mg total) by mouth daily., Disp: 90 tablet, Rfl: 1   isosorbide  mononitrate (IMDUR ) 30 MG 24 hr tablet, Take 1 tablet (30 mg total) by mouth daily. (Patient taking differently: Take 60 mg by mouth daily.), Disp: 90 tablet, Rfl: 1   latanoprost  (XALATAN ) 0.005 % ophthalmic solution, Place 1 drop into both eyes at bedtime., Disp: 7.5  mL, Rfl: 4   levothyroxine  (SYNTHROID ) 137 MCG tablet, Take 1 tablet (137 mcg total) by mouth daily., Disp: 90 tablet, Rfl: 1   losartan  (COZAAR ) 100 MG tablet, Take 1 tablet (100 mg total) by mouth daily., Disp: 90 tablet, Rfl: 1   metoprolol  tartrate (LOPRESSOR ) 25 MG tablet, Take 1 tablet (25 mg total) by mouth 2 (two) times daily. (Patient taking differently: Take 12.5 mg by mouth 2 (two) times daily.), Disp: 180 tablet, Rfl: 1   Multiple Vitamin (MULTIVITAMIN WITH MINERALS) TABS tablet, Take 1 tablet by mouth in the morning., Disp: , Rfl:    Polyethyl Glyc-Propyl Glyc PF (SYSTANE HYDRATION PF) 0.4-0.3 % SOLN, Place 1 drop into both eyes daily as needed (dry, gritty eyes)., Disp: , Rfl:    rosuvastatin  (CRESTOR ) 40 MG tablet, Take 1 tablet (40 mg total) by mouth daily., Disp: 90 tablet, Rfl: 1  Review of Systems:  Negative unless indicated in HPI.   Physical Exam: Vitals:   10/21/23 0737 10/21/23 0740  BP: (!) 150/70 (!) 148/70  Pulse: 60   Temp: 98.2 F (36.8 C)   TempSrc: Oral   SpO2: 98%   Weight: 154 lb 1.6 oz (69.9 kg)     Body mass index is 29.12 kg/m.   Physical Exam Vitals reviewed.  Constitutional:      Appearance: Normal appearance.  HENT:     Head: Normocephalic and atraumatic.  Eyes:     Conjunctiva/sclera: Conjunctivae normal.  Cardiovascular:     Rate and Rhythm: Normal rate and regular rhythm.  Pulmonary:     Effort: Pulmonary effort is normal.     Breath sounds: Normal breath sounds.  Skin:    General: Skin is warm and dry.  Neurological:     General: No focal deficit present.     Mental Status: She is alert and oriented to person, place, and time.  Psychiatric:        Mood and Affect: Mood normal.        Behavior: Behavior normal.        Thought Content: Thought content normal.        Judgment: Judgment normal.      Impression and Plan:  Type 2 diabetes mellitus with other circulatory complication, without long-term current use of insulin   (HCC) -     Microalbumin / creatinine urine ratio -     POCT glycosylated hemoglobin (Hb A1C) -     Empagliflozin; Take 1 tablet (10 mg total) by mouth daily.  Dispense: 90 tablet; Refill: 1  Coronary arteriosclerosis  Essential hypertension  Stage 3a chronic kidney disease (HCC) -     Empagliflozin; Take 1 tablet (10 mg total) by mouth daily.  Dispense: 90 tablet; Refill: 1  Microalbuminuria due to type 2 diabetes mellitus (HCC) -     Empagliflozin; Take 1 tablet (10 mg total) by mouth daily.  Dispense: 90  tablet; Refill: 1  Hyperlipidemia with target LDL less than 70   - Given her diabetes, coronary artery disease and chronic kidney disease she would be a good candidate for an SGLT2.  I will go ahead and start Jardiance 10 mg daily.  Return in 3 months for follow-up. - Blood pressure is elevated but followed by cardiology who recently made medication adjustments. - Cholesterol is at goal with recent LDL of 64.   Time spent:31 minutes reviewing chart, interviewing and examining patient and formulating plan of care.     Tully Theophilus Andrews, MD [facility]

## 2023-10-26 ENCOUNTER — Telehealth: Payer: Self-pay | Admitting: *Deleted

## 2023-10-26 ENCOUNTER — Other Ambulatory Visit: Payer: Self-pay

## 2023-10-26 ENCOUNTER — Other Ambulatory Visit (HOSPITAL_COMMUNITY): Payer: Self-pay

## 2023-10-26 NOTE — Telephone Encounter (Signed)
 Please see other encounter.

## 2023-10-26 NOTE — Telephone Encounter (Signed)
 Copied from CRM [id number]. Topic: Clinical - Prescription Issue >> Oct 26, 2023  7:43 AM Pinkey ORN wrote: Reason for CRM: JARDIANCE >> Oct 26, 2023  7:43 AM Pinkey ORN wrote: [facility] [phone] Called on behalf of patient medication empagliflozin (JARDIANCE) 10 MG TABS tablet, states it's $437 and with patient having medicare they can't add on any discounted price. Amy wants to know if Theophilus Andrews, Estela could discontinue the medication and give something else in place of it.

## 2023-10-26 NOTE — Telephone Encounter (Signed)
 Copied from CRM [id number]. Topic: Clinical - Prescription Issue >> Oct 26, 2023  7:37 AM Turkey A wrote: Reason for CRM: Patient called said that she can not afford the Jardiance medication

## 2023-10-26 NOTE — Telephone Encounter (Signed)
 Copied from CRM [id number]. Topic: Clinical - Prescription Issue >> Oct 26, 2023  7:43 AM Pinkey ORN wrote: Reason for CRM: JARDIANCE >> Oct 26, 2023  7:43 AM Pinkey ORN wrote: [facility] [phone] Called on behalf of patient medication empagliflozin (JARDIANCE) 10 MG TABS tablet, states it's $437 and with patient having medicare they can't add on any discounted price. Amy wants to know if Tasha Garrett, Tasha Garrett could discontinue the medication and give something else in place of it.

## 2023-10-27 NOTE — Telephone Encounter (Signed)
 Patient should qualify for patient assistance for the alternative Farxiga, okay to start that process for patient?

## 2023-11-01 DIAGNOSIS — Z955 Presence of coronary angioplasty implant and graft: Secondary | ICD-10-CM | POA: Diagnosis not present

## 2023-11-01 DIAGNOSIS — Z48812 Encounter for surgical aftercare following surgery on the circulatory system: Secondary | ICD-10-CM | POA: Diagnosis not present

## 2023-11-03 ENCOUNTER — Telehealth: Payer: Self-pay

## 2023-11-03 ENCOUNTER — Other Ambulatory Visit (HOSPITAL_COMMUNITY): Payer: Self-pay

## 2023-11-03 NOTE — Progress Notes (Signed)
   11/03/2023  Patient ID: Tasha Garrett, female   DOB: 05-20-1950, 73 y.o.   MRN: [id number]  Received notification that patient has been approved to receive Farxiga through [employer] through 01/11/25. Notified patient and to expect shipment at her home in the next 7-10 business days.  Reminded of scheduled f/u with PCP. Instructed patient to reach out sooner if medication arrives and she has any questions.  Jon VEAR Lindau, PharmD Clinical Pharmacist [phone]

## 2023-11-08 DIAGNOSIS — Z48812 Encounter for surgical aftercare following surgery on the circulatory system: Secondary | ICD-10-CM | POA: Diagnosis not present

## 2023-11-08 DIAGNOSIS — Z955 Presence of coronary angioplasty implant and graft: Secondary | ICD-10-CM | POA: Diagnosis not present

## 2023-11-09 DIAGNOSIS — E1122 Type 2 diabetes mellitus with diabetic chronic kidney disease: Secondary | ICD-10-CM | POA: Diagnosis not present

## 2023-11-09 DIAGNOSIS — I25119 Atherosclerotic heart disease of native coronary artery with unspecified angina pectoris: Secondary | ICD-10-CM | POA: Diagnosis not present

## 2023-11-09 DIAGNOSIS — Z7902 Long term (current) use of antithrombotics/antiplatelets: Secondary | ICD-10-CM | POA: Diagnosis not present

## 2023-11-09 DIAGNOSIS — E785 Hyperlipidemia, unspecified: Secondary | ICD-10-CM | POA: Diagnosis not present

## 2023-11-09 DIAGNOSIS — I252 Old myocardial infarction: Secondary | ICD-10-CM | POA: Diagnosis not present

## 2023-11-09 DIAGNOSIS — M545 Low back pain, unspecified: Secondary | ICD-10-CM | POA: Diagnosis not present

## 2023-11-09 DIAGNOSIS — E039 Hypothyroidism, unspecified: Secondary | ICD-10-CM | POA: Diagnosis not present

## 2023-11-09 DIAGNOSIS — Z7982 Long term (current) use of aspirin: Secondary | ICD-10-CM | POA: Diagnosis not present

## 2023-11-09 DIAGNOSIS — Z79890 Hormone replacement therapy (postmenopausal): Secondary | ICD-10-CM | POA: Diagnosis not present

## 2023-11-09 DIAGNOSIS — Z6829 Body mass index (BMI) 29.0-29.9, adult: Secondary | ICD-10-CM | POA: Diagnosis not present

## 2023-11-09 DIAGNOSIS — Z87891 Personal history of nicotine dependence: Secondary | ICD-10-CM | POA: Diagnosis not present

## 2023-11-09 DIAGNOSIS — I1310 Hypertensive heart and chronic kidney disease without heart failure, with stage 1 through stage 4 chronic kidney disease, or unspecified chronic kidney disease: Secondary | ICD-10-CM | POA: Diagnosis not present

## 2023-11-09 DIAGNOSIS — Z955 Presence of coronary angioplasty implant and graft: Secondary | ICD-10-CM | POA: Diagnosis not present

## 2023-11-09 DIAGNOSIS — H409 Unspecified glaucoma: Secondary | ICD-10-CM | POA: Diagnosis not present

## 2023-11-09 DIAGNOSIS — Z833 Family history of diabetes mellitus: Secondary | ICD-10-CM | POA: Diagnosis not present

## 2023-11-09 DIAGNOSIS — E663 Overweight: Secondary | ICD-10-CM | POA: Diagnosis not present

## 2023-11-09 DIAGNOSIS — N183 Chronic kidney disease, stage 3 unspecified: Secondary | ICD-10-CM | POA: Diagnosis not present

## 2023-11-15 ENCOUNTER — Other Ambulatory Visit (HOSPITAL_COMMUNITY): Payer: Self-pay

## 2023-11-15 ENCOUNTER — Other Ambulatory Visit (HOSPITAL_BASED_OUTPATIENT_CLINIC_OR_DEPARTMENT_OTHER): Payer: Self-pay

## 2023-11-15 ENCOUNTER — Encounter: Payer: Self-pay | Admitting: Radiology

## 2023-11-15 DIAGNOSIS — Z955 Presence of coronary angioplasty implant and graft: Secondary | ICD-10-CM | POA: Diagnosis not present

## 2023-11-15 DIAGNOSIS — Z48812 Encounter for surgical aftercare following surgery on the circulatory system: Secondary | ICD-10-CM | POA: Diagnosis not present

## 2023-11-16 ENCOUNTER — Other Ambulatory Visit: Payer: Self-pay

## 2023-11-22 DIAGNOSIS — Z48812 Encounter for surgical aftercare following surgery on the circulatory system: Secondary | ICD-10-CM | POA: Diagnosis not present

## 2023-11-22 DIAGNOSIS — Z955 Presence of coronary angioplasty implant and graft: Secondary | ICD-10-CM | POA: Diagnosis not present

## 2023-11-24 ENCOUNTER — Ambulatory Visit: Admitting: Internal Medicine

## 2023-11-24 ENCOUNTER — Other Ambulatory Visit: Payer: Self-pay | Admitting: Internal Medicine

## 2023-11-24 ENCOUNTER — Encounter: Payer: Self-pay | Admitting: Internal Medicine

## 2023-11-24 VITALS — BP 128/74 | HR 75 | Temp 98.4°F | Wt 150.7 lb

## 2023-11-24 DIAGNOSIS — E785 Hyperlipidemia, unspecified: Secondary | ICD-10-CM | POA: Diagnosis not present

## 2023-11-24 DIAGNOSIS — R809 Proteinuria, unspecified: Secondary | ICD-10-CM

## 2023-11-24 DIAGNOSIS — E1129 Type 2 diabetes mellitus with other diabetic kidney complication: Secondary | ICD-10-CM | POA: Diagnosis not present

## 2023-11-24 DIAGNOSIS — I2510 Atherosclerotic heart disease of native coronary artery without angina pectoris: Secondary | ICD-10-CM

## 2023-11-24 DIAGNOSIS — N1831 Chronic kidney disease, stage 3a: Secondary | ICD-10-CM

## 2023-11-24 DIAGNOSIS — E1159 Type 2 diabetes mellitus with other circulatory complications: Secondary | ICD-10-CM

## 2023-11-24 DIAGNOSIS — E039 Hypothyroidism, unspecified: Secondary | ICD-10-CM | POA: Diagnosis not present

## 2023-11-24 DIAGNOSIS — I10 Essential (primary) hypertension: Secondary | ICD-10-CM

## 2023-11-24 LAB — POC URINALSYSI DIPSTICK (AUTOMATED)
Bilirubin, UA: NEGATIVE
Blood, UA: NEGATIVE
Glucose, UA: POSITIVE — AB
Ketones, UA: NEGATIVE
Leukocytes, UA: NEGATIVE
Nitrite, UA: NEGATIVE
Protein, UA: POSITIVE — AB
Spec Grav, UA: 1.015 (ref 1.010–1.025)
Urobilinogen, UA: 0.2 U/dL
pH, UA: 6 (ref 5.0–8.0)

## 2023-11-24 NOTE — Assessment & Plan Note (Signed)
 On losartan  and Farxiga.  Check microalbumin today.

## 2023-11-24 NOTE — Assessment & Plan Note (Signed)
 On levothyroxine with a stable TSH at 1.660.

## 2023-11-24 NOTE — Assessment & Plan Note (Signed)
 Recent LDL 64 on atorvastatin .

## 2023-11-24 NOTE — Progress Notes (Signed)
 Established Patient Office Visit     CC/Reason for Visit: Follow-up chronic conditions  HPI: Tasha Garrett is a 73 y.o. female who is coming in today for the above mentioned reasons. Past Medical History is significant for: Hypertension, hyperlipidemia, type 2 diabetes, chronic kidney disease stage IIIA, hypothyroidism, coronary artery disease and carotid artery disease.  No major concerns or complaints.  Has been having some increased urinary frequency without dysuria, suspected due to Farxiga.   Past Medical/Surgical History: Past Medical History:  Diagnosis Date   Arthritis    Carotid artery occlusion    Chronic kidney disease    stage 3A   Coronary arteriosclerosis    Diabetes mellitus without complication (HCC)    type 2, no meds, does not check blood sugar per pt on 04/27/23   High cholesterol    Hypertension    Hypothyroid    Myocardial infarction [facility]) 2012   in [place]   Urinary tract infection     Past Surgical History:  Procedure Laterality Date   ABDOMINAL HYSTERECTOMY     COLONOSCOPY     CORONARY ARTERY BYPASS GRAFT  05/13/2011   CABG x3: LIMA-mid LAD, SVG-OM, SVG-RCA ([facility])   CORONARY LITHOTRIPSY N/A 08/26/2023   Procedure: CORONARY LITHOTRIPSY;  Surgeon: Jordan, Peter M, MD;  Location: [facility] INVASIVE CV LAB;  Service: Cardiovascular;  Laterality: N/A;   CORONARY STENT INTERVENTION N/A 08/26/2023   Procedure: CORONARY STENT INTERVENTION;  Surgeon: Jordan, Peter M, MD;  Location: [facility] INVASIVE CV LAB;  Service: Cardiovascular;  Laterality: N/A;   CORONARY ULTRASOUND/IVUS N/A 08/26/2023   Procedure: Coronary Ultrasound/IVUS;  Surgeon: Jordan, Peter M, MD;  Location: [facility] INVASIVE CV LAB;  Service: Cardiovascular;  Laterality: N/A;   EYE SURGERY Bilateral    cataracts removed   LEFT HEART CATH AND CORONARY ANGIOGRAPHY N/A 08/25/2023   Procedure: LEFT HEART CATH AND CORONARY ANGIOGRAPHY;  Surgeon: Jordan, Peter M, MD;  Location: [facility] INVASIVE CV LAB;  Service:  Cardiovascular;  Laterality: N/A;   TRANSCAROTID ARTERY REVASCULARIZATION  Left 05/05/2023   Procedure: LEFT TRANSCAROTID ARTERY REVASCULARIZATION (TCAR);  Surgeon: Gretta Lonni PARAS, MD;  Location: [facility] OR;  Service: Vascular;  Laterality: Left;    Social History:  reports that she quit smoking about 13 years ago. Her smoking use included cigarettes. She started smoking about 33 years ago. She has a 10 pack-year smoking history. She has never used smokeless tobacco. She reports that she does not currently use alcohol. She reports that she does not currently use drugs.  Allergies: No Known Allergies  Family History:  Family History  Problem Relation Age of Onset   ALS Mother      Current Outpatient Medications:    amLODipine  (NORVASC ) 10 MG tablet, Take 1 tablet (10 mg total) by mouth daily., Disp: 90 tablet, Rfl: 1   aspirin  EC 81 MG tablet, Take 81 mg by mouth in the morning., Disp: , Rfl:    brimonidine  (ALPHAGAN ) 0.2 % ophthalmic solution, Place 1 drop into both eyes in the morning and at bedtime., Disp: , Rfl:    clopidogrel  (PLAVIX ) 75 MG tablet, Take 1 tablet (75 mg total) by mouth daily., Disp: 90 tablet, Rfl: 1   Cod Liver Oil CAPS, Take 1 capsule by mouth in the morning., Disp: , Rfl:    dapagliflozin propanediol (FARXIGA) 5 MG TABS tablet, Take 5 mg by mouth daily. Getting thorugh [employer] PAP, Disp: , Rfl:    isosorbide  mononitrate (IMDUR ) 30 MG 24 hr tablet,  TAKE 1 TABLET EVERY DAY, Disp: 90 tablet, Rfl: 1   latanoprost  (XALATAN ) 0.005 % ophthalmic solution, Place 1 drop into both eyes at bedtime., Disp: 7.5 mL, Rfl: 4   levothyroxine  (SYNTHROID ) 137 MCG tablet, Take 1 tablet (137 mcg total) by mouth daily., Disp: 90 tablet, Rfl: 1   losartan  (COZAAR ) 100 MG tablet, TAKE 1 TABLET (100 MG TOTAL) BY MOUTH DAILY., Disp: 90 tablet, Rfl: 1   metoprolol  tartrate (LOPRESSOR ) 25 MG tablet, Take 1 tablet (25 mg total) by mouth 2 (two) times daily. (Patient taking differently: Take  12.5 mg by mouth 2 (two) times daily.), Disp: 180 tablet, Rfl: 1   Multiple Vitamin (MULTIVITAMIN WITH MINERALS) TABS tablet, Take 1 tablet by mouth in the morning., Disp: , Rfl:    Polyethyl Glyc-Propyl Glyc PF (SYSTANE HYDRATION PF) 0.4-0.3 % SOLN, Place 1 drop into both eyes daily as needed (dry, gritty eyes)., Disp: , Rfl:    rosuvastatin  (CRESTOR ) 40 MG tablet, Take 1 tablet (40 mg total) by mouth daily., Disp: 90 tablet, Rfl: 1  Review of Systems:  Negative unless indicated in HPI.   Physical Exam: Vitals:   11/24/23 1455 11/24/23 1459  BP: (!) 160/70 128/74  Pulse: 75   Temp: 98.4 F (36.9 C)   TempSrc: Oral   SpO2: 98%   Weight: 150 lb 11.2 oz (68.4 kg)     Body mass index is 28.47 kg/m.   Physical Exam Vitals reviewed.  Constitutional:      Appearance: Normal appearance.  HENT:     Head: Normocephalic and atraumatic.  Eyes:     Conjunctiva/sclera: Conjunctivae normal.  Cardiovascular:     Rate and Rhythm: Normal rate and regular rhythm.  Pulmonary:     Effort: Pulmonary effort is normal.     Breath sounds: Normal breath sounds.  Skin:    General: Skin is warm and dry.  Neurological:     General: No focal deficit present.     Mental Status: She is alert and oriented to person, place, and time.  Psychiatric:        Mood and Affect: Mood normal.        Behavior: Behavior normal.        Thought Content: Thought content normal.        Judgment: Judgment normal.      Impression and Plan:  Type 2 diabetes mellitus with other circulatory complication, without long-term current use of insulin  (HCC) Assessment & Plan: Well-controlled on current with a recent A1c of 6.4.  Orders: -     Microalbumin / creatinine urine ratio; Future  Stage 3a chronic kidney disease (HCC) Assessment & Plan: Followed by nephrology.  Baseline creatinine around 1.2-1.4.  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  Check microalbumin today.  Orders: -     POCT Urinalysis Dipstick (Automated) -     Microalbumin / creatinine urine ratio; Future  Essential hypertension Assessment & Plan: Fairly well-controlled on current.   Hyperlipidemia with target LDL less than 70 Assessment & Plan: Recent LDL 64 on atorvastatin .   Acquired hypothyroidism Assessment & Plan: On levothyroxine  with a stable TSH at 1.660.      Time spent:31 minutes reviewing chart, interviewing and examining patient and formulating plan of care.     Tully Theophilus Andrews, MD [facility]

## 2023-11-24 NOTE — Assessment & Plan Note (Signed)
Fairly well-controlled on current.

## 2023-11-24 NOTE — Assessment & Plan Note (Signed)
Followed by nephrology.  Baseline creatinine around 1.2-1.4.

## 2023-11-24 NOTE — Assessment & Plan Note (Signed)
 Well-controlled on current with a recent A1c of 6.4.

## 2023-11-25 ENCOUNTER — Other Ambulatory Visit (HOSPITAL_COMMUNITY): Payer: Self-pay

## 2023-11-25 LAB — MICROALBUMIN / CREATININE URINE RATIO
Creatinine,U: 137.6 mg/dL
Microalb Creat Ratio: 74.4 mg/g — ABNORMAL HIGH (ref 0.0–30.0)
Microalb, Ur: 10.2 mg/dL — ABNORMAL HIGH (ref 0.0–1.9)

## 2023-11-29 DIAGNOSIS — Z955 Presence of coronary angioplasty implant and graft: Secondary | ICD-10-CM | POA: Diagnosis not present

## 2023-11-29 DIAGNOSIS — Z48812 Encounter for surgical aftercare following surgery on the circulatory system: Secondary | ICD-10-CM | POA: Diagnosis not present

## 2023-12-07 ENCOUNTER — Telehealth: Payer: Self-pay | Admitting: *Deleted

## 2023-12-07 DIAGNOSIS — Z48812 Encounter for surgical aftercare following surgery on the circulatory system: Secondary | ICD-10-CM | POA: Diagnosis not present

## 2023-12-07 DIAGNOSIS — Z1231 Encounter for screening mammogram for malignant neoplasm of breast: Secondary | ICD-10-CM

## 2023-12-07 DIAGNOSIS — Z955 Presence of coronary angioplasty implant and graft: Secondary | ICD-10-CM | POA: Diagnosis not present

## 2023-12-07 NOTE — Telephone Encounter (Signed)
 Mammogram was ordered.

## 2023-12-07 NOTE — Telephone Encounter (Signed)
 Copied from CRM [id number]. Topic: Clinical - Request for Lab/Test Order >> Dec 07, 2023  3:44 PM China J wrote: Reason for CRM: The patient received a letter in the mail telling her that it's time for a mammogram. She would like an order placed and a follow up for scheduling.   Please call at [phone].

## 2023-12-15 DIAGNOSIS — I10 Essential (primary) hypertension: Secondary | ICD-10-CM | POA: Diagnosis not present

## 2023-12-15 DIAGNOSIS — D649 Anemia, unspecified: Secondary | ICD-10-CM | POA: Diagnosis not present

## 2023-12-15 DIAGNOSIS — N2581 Secondary hyperparathyroidism of renal origin: Secondary | ICD-10-CM | POA: Diagnosis not present

## 2023-12-21 DIAGNOSIS — N1832 Chronic kidney disease, stage 3b: Secondary | ICD-10-CM | POA: Diagnosis not present

## 2023-12-21 DIAGNOSIS — I10 Essential (primary) hypertension: Secondary | ICD-10-CM | POA: Diagnosis not present

## 2023-12-21 DIAGNOSIS — I2510 Atherosclerotic heart disease of native coronary artery without angina pectoris: Secondary | ICD-10-CM | POA: Diagnosis not present

## 2023-12-21 DIAGNOSIS — D649 Anemia, unspecified: Secondary | ICD-10-CM | POA: Diagnosis not present

## 2023-12-28 ENCOUNTER — Encounter: Payer: Self-pay | Admitting: Internal Medicine

## 2024-01-21 ENCOUNTER — Ambulatory Visit

## 2024-01-21 ENCOUNTER — Ambulatory Visit
Admission: RE | Admit: 2024-01-21 | Discharge: 2024-01-21 | Disposition: A | Source: Ambulatory Visit | Attending: Internal Medicine | Admitting: Internal Medicine

## 2024-01-21 DIAGNOSIS — Z1231 Encounter for screening mammogram for malignant neoplasm of breast: Secondary | ICD-10-CM

## 2024-01-24 ENCOUNTER — Other Ambulatory Visit (HOSPITAL_COMMUNITY): Payer: Self-pay

## 2024-01-25 ENCOUNTER — Other Ambulatory Visit: Payer: Self-pay

## 2024-02-10 ENCOUNTER — Other Ambulatory Visit (HOSPITAL_COMMUNITY): Payer: Self-pay

## 2024-02-24 ENCOUNTER — Ambulatory Visit: Admitting: Internal Medicine

## 2024-05-16 ENCOUNTER — Ambulatory Visit (HOSPITAL_COMMUNITY)

## 2024-05-16 ENCOUNTER — Ambulatory Visit

## 2024-06-26 ENCOUNTER — Ambulatory Visit
